# Patient Record
Sex: Female | Born: 1935 | Race: White | Hispanic: No | Marital: Married | Smoking: Never smoker
Health system: Southern US, Community
[De-identification: ages and names within clinical notes are randomized; demographics above are authoritative.]

## PROBLEM LIST (undated history)

## (undated) DIAGNOSIS — Z96649 Presence of unspecified artificial hip joint: Secondary | ICD-10-CM

## (undated) DIAGNOSIS — R41841 Cognitive communication deficit: Secondary | ICD-10-CM

## (undated) DIAGNOSIS — M978XXA Periprosthetic fracture around other internal prosthetic joint, initial encounter: Secondary | ICD-10-CM

## (undated) DIAGNOSIS — R296 Repeated falls: Secondary | ICD-10-CM

## (undated) DIAGNOSIS — W19XXXA Unspecified fall, initial encounter: Secondary | ICD-10-CM

## (undated) DIAGNOSIS — S72009A Fracture of unspecified part of neck of unspecified femur, initial encounter for closed fracture: Secondary | ICD-10-CM

## (undated) DIAGNOSIS — L8990 Pressure ulcer of unspecified site, unspecified stage: Secondary | ICD-10-CM

## (undated) DIAGNOSIS — R1310 Dysphagia, unspecified: Secondary | ICD-10-CM

## (undated) DIAGNOSIS — S329XXA Fracture of unspecified parts of lumbosacral spine and pelvis, initial encounter for closed fracture: Secondary | ICD-10-CM

## (undated) DIAGNOSIS — R053 Chronic cough: Secondary | ICD-10-CM

## (undated) DIAGNOSIS — R05 Cough: Secondary | ICD-10-CM

## (undated) DIAGNOSIS — A0472 Enterocolitis due to Clostridium difficile, not specified as recurrent: Secondary | ICD-10-CM

## (undated) DIAGNOSIS — R413 Other amnesia: Secondary | ICD-10-CM

## (undated) DIAGNOSIS — G20C Parkinsonism, unspecified: Secondary | ICD-10-CM

## (undated) DIAGNOSIS — R262 Difficulty in walking, not elsewhere classified: Secondary | ICD-10-CM

## (undated) DIAGNOSIS — F0390 Unspecified dementia without behavioral disturbance: Secondary | ICD-10-CM

## (undated) DIAGNOSIS — M85819 Other specified disorders of bone density and structure, unspecified shoulder: Secondary | ICD-10-CM

## (undated) DIAGNOSIS — G20 Parkinson's disease: Secondary | ICD-10-CM

## (undated) HISTORY — DX: Parkinson's disease: G20

## (undated) HISTORY — PX: JOINT REPLACEMENT: SHX530

## (undated) HISTORY — DX: Other amnesia: R41.3

## (undated) HISTORY — PX: TOTAL HIP ARTHROPLASTY: SHX124

## (undated) HISTORY — DX: Chronic cough: R05.3

## (undated) HISTORY — DX: Fracture of unspecified part of neck of unspecified femur, initial encounter for closed fracture: S72.009A

## (undated) HISTORY — DX: Parkinsonism, unspecified: G20.C

## (undated) HISTORY — PX: FRACTURE SURGERY: SHX138

## (undated) HISTORY — DX: Fracture of unspecified parts of lumbosacral spine and pelvis, initial encounter for closed fracture: S32.9XXA

## (undated) HISTORY — PX: BREAST LUMPECTOMY: SHX2

## (undated) HISTORY — PX: CATARACT EXTRACTION: SUR2

## (undated) HISTORY — DX: Cough: R05

## (undated) HISTORY — PX: ABDOMINAL HYSTERECTOMY: SHX81

---

## 2011-08-13 DIAGNOSIS — J210 Acute bronchiolitis due to respiratory syncytial virus: Secondary | ICD-10-CM | POA: Diagnosis not present

## 2011-09-27 DIAGNOSIS — H2510 Age-related nuclear cataract, unspecified eye: Secondary | ICD-10-CM | POA: Diagnosis not present

## 2011-10-01 DIAGNOSIS — R918 Other nonspecific abnormal finding of lung field: Secondary | ICD-10-CM | POA: Diagnosis not present

## 2011-10-01 DIAGNOSIS — R05 Cough: Secondary | ICD-10-CM | POA: Diagnosis not present

## 2011-10-08 DIAGNOSIS — R922 Inconclusive mammogram: Secondary | ICD-10-CM | POA: Diagnosis not present

## 2011-10-08 DIAGNOSIS — Z1231 Encounter for screening mammogram for malignant neoplasm of breast: Secondary | ICD-10-CM | POA: Diagnosis not present

## 2011-10-26 DIAGNOSIS — R928 Other abnormal and inconclusive findings on diagnostic imaging of breast: Secondary | ICD-10-CM | POA: Diagnosis not present

## 2011-10-28 DIAGNOSIS — R0602 Shortness of breath: Secondary | ICD-10-CM | POA: Diagnosis not present

## 2011-10-28 DIAGNOSIS — R0609 Other forms of dyspnea: Secondary | ICD-10-CM | POA: Diagnosis not present

## 2011-10-28 DIAGNOSIS — R0989 Other specified symptoms and signs involving the circulatory and respiratory systems: Secondary | ICD-10-CM | POA: Diagnosis not present

## 2012-02-21 DIAGNOSIS — R5381 Other malaise: Secondary | ICD-10-CM | POA: Diagnosis not present

## 2012-02-21 DIAGNOSIS — E7800 Pure hypercholesterolemia, unspecified: Secondary | ICD-10-CM | POA: Diagnosis not present

## 2012-02-21 DIAGNOSIS — Z1212 Encounter for screening for malignant neoplasm of rectum: Secondary | ICD-10-CM | POA: Diagnosis not present

## 2012-02-21 DIAGNOSIS — Z0000 Encounter for general adult medical examination without abnormal findings: Secondary | ICD-10-CM | POA: Diagnosis not present

## 2012-03-24 DIAGNOSIS — J449 Chronic obstructive pulmonary disease, unspecified: Secondary | ICD-10-CM | POA: Diagnosis not present

## 2012-03-24 DIAGNOSIS — R05 Cough: Secondary | ICD-10-CM | POA: Diagnosis not present

## 2012-03-24 DIAGNOSIS — R059 Cough, unspecified: Secondary | ICD-10-CM | POA: Diagnosis not present

## 2012-03-24 DIAGNOSIS — J310 Chronic rhinitis: Secondary | ICD-10-CM | POA: Diagnosis not present

## 2012-05-16 DIAGNOSIS — Z01419 Encounter for gynecological examination (general) (routine) without abnormal findings: Secondary | ICD-10-CM | POA: Diagnosis not present

## 2012-05-16 DIAGNOSIS — L940 Localized scleroderma [morphea]: Secondary | ICD-10-CM | POA: Diagnosis not present

## 2012-05-16 DIAGNOSIS — R87619 Unspecified abnormal cytological findings in specimens from cervix uteri: Secondary | ICD-10-CM | POA: Diagnosis not present

## 2012-09-28 DIAGNOSIS — H2510 Age-related nuclear cataract, unspecified eye: Secondary | ICD-10-CM | POA: Diagnosis not present

## 2012-10-02 DIAGNOSIS — M79609 Pain in unspecified limb: Secondary | ICD-10-CM | POA: Diagnosis not present

## 2012-11-08 DIAGNOSIS — Z1231 Encounter for screening mammogram for malignant neoplasm of breast: Secondary | ICD-10-CM | POA: Diagnosis not present

## 2013-01-29 DIAGNOSIS — H2510 Age-related nuclear cataract, unspecified eye: Secondary | ICD-10-CM | POA: Diagnosis not present

## 2013-01-29 DIAGNOSIS — H04209 Unspecified epiphora, unspecified lacrimal gland: Secondary | ICD-10-CM | POA: Diagnosis not present

## 2013-01-29 DIAGNOSIS — H524 Presbyopia: Secondary | ICD-10-CM | POA: Diagnosis not present

## 2013-01-29 DIAGNOSIS — H43399 Other vitreous opacities, unspecified eye: Secondary | ICD-10-CM | POA: Diagnosis not present

## 2013-01-29 DIAGNOSIS — H01009 Unspecified blepharitis unspecified eye, unspecified eyelid: Secondary | ICD-10-CM | POA: Diagnosis not present

## 2013-01-29 DIAGNOSIS — H16229 Keratoconjunctivitis sicca, not specified as Sjogren's, unspecified eye: Secondary | ICD-10-CM | POA: Diagnosis not present

## 2013-02-26 DIAGNOSIS — E7800 Pure hypercholesterolemia, unspecified: Secondary | ICD-10-CM | POA: Diagnosis not present

## 2013-02-26 DIAGNOSIS — R5381 Other malaise: Secondary | ICD-10-CM | POA: Diagnosis not present

## 2013-02-26 DIAGNOSIS — Z1212 Encounter for screening for malignant neoplasm of rectum: Secondary | ICD-10-CM | POA: Diagnosis not present

## 2013-02-26 DIAGNOSIS — R42 Dizziness and giddiness: Secondary | ICD-10-CM | POA: Diagnosis not present

## 2013-02-26 DIAGNOSIS — R0602 Shortness of breath: Secondary | ICD-10-CM | POA: Diagnosis not present

## 2013-03-02 DIAGNOSIS — R5381 Other malaise: Secondary | ICD-10-CM | POA: Diagnosis not present

## 2013-03-02 DIAGNOSIS — R42 Dizziness and giddiness: Secondary | ICD-10-CM | POA: Diagnosis not present

## 2013-03-02 DIAGNOSIS — R413 Other amnesia: Secondary | ICD-10-CM | POA: Diagnosis not present

## 2013-03-12 DIAGNOSIS — E2839 Other primary ovarian failure: Secondary | ICD-10-CM | POA: Diagnosis not present

## 2013-03-12 DIAGNOSIS — Z0000 Encounter for general adult medical examination without abnormal findings: Secondary | ICD-10-CM | POA: Diagnosis not present

## 2013-03-13 DIAGNOSIS — H2510 Age-related nuclear cataract, unspecified eye: Secondary | ICD-10-CM | POA: Diagnosis not present

## 2013-03-13 DIAGNOSIS — H269 Unspecified cataract: Secondary | ICD-10-CM | POA: Diagnosis not present

## 2013-04-12 DIAGNOSIS — H2510 Age-related nuclear cataract, unspecified eye: Secondary | ICD-10-CM | POA: Diagnosis not present

## 2013-05-08 DIAGNOSIS — H2510 Age-related nuclear cataract, unspecified eye: Secondary | ICD-10-CM | POA: Diagnosis not present

## 2013-05-08 DIAGNOSIS — H269 Unspecified cataract: Secondary | ICD-10-CM | POA: Diagnosis not present

## 2013-05-16 DIAGNOSIS — Z8742 Personal history of other diseases of the female genital tract: Secondary | ICD-10-CM | POA: Diagnosis not present

## 2013-05-16 DIAGNOSIS — Z1272 Encounter for screening for malignant neoplasm of vagina: Secondary | ICD-10-CM | POA: Diagnosis not present

## 2013-05-16 DIAGNOSIS — Z01419 Encounter for gynecological examination (general) (routine) without abnormal findings: Secondary | ICD-10-CM | POA: Diagnosis not present

## 2013-06-05 DIAGNOSIS — F411 Generalized anxiety disorder: Secondary | ICD-10-CM | POA: Diagnosis not present

## 2013-06-05 DIAGNOSIS — G4700 Insomnia, unspecified: Secondary | ICD-10-CM | POA: Diagnosis not present

## 2013-07-05 DIAGNOSIS — G4700 Insomnia, unspecified: Secondary | ICD-10-CM | POA: Diagnosis not present

## 2013-07-05 DIAGNOSIS — R42 Dizziness and giddiness: Secondary | ICD-10-CM | POA: Diagnosis not present

## 2013-09-19 ENCOUNTER — Emergency Department (HOSPITAL_COMMUNITY): Payer: Medicare Other

## 2013-09-19 ENCOUNTER — Encounter (HOSPITAL_COMMUNITY): Payer: Self-pay | Admitting: Emergency Medicine

## 2013-09-19 ENCOUNTER — Inpatient Hospital Stay (HOSPITAL_COMMUNITY)
Admission: EM | Admit: 2013-09-19 | Discharge: 2013-09-24 | DRG: 552 | Disposition: A | Discharge status: Skilled Nursing Facility | Payer: Medicare Other | Attending: Family Medicine | Admitting: Family Medicine

## 2013-09-19 DIAGNOSIS — J42 Unspecified chronic bronchitis: Secondary | ICD-10-CM | POA: Diagnosis not present

## 2013-09-19 DIAGNOSIS — R339 Retention of urine, unspecified: Secondary | ICD-10-CM

## 2013-09-19 DIAGNOSIS — S79919A Unspecified injury of unspecified hip, initial encounter: Secondary | ICD-10-CM | POA: Diagnosis not present

## 2013-09-19 DIAGNOSIS — W19XXXA Unspecified fall, initial encounter: Secondary | ICD-10-CM | POA: Diagnosis present

## 2013-09-19 DIAGNOSIS — M25559 Pain in unspecified hip: Secondary | ICD-10-CM | POA: Diagnosis not present

## 2013-09-19 DIAGNOSIS — D72829 Elevated white blood cell count, unspecified: Secondary | ICD-10-CM | POA: Diagnosis not present

## 2013-09-19 DIAGNOSIS — Z9181 History of falling: Secondary | ICD-10-CM

## 2013-09-19 DIAGNOSIS — S329XXA Fracture of unspecified parts of lumbosacral spine and pelvis, initial encounter for closed fracture: Secondary | ICD-10-CM | POA: Diagnosis present

## 2013-09-19 DIAGNOSIS — S32509A Unspecified fracture of unspecified pubis, initial encounter for closed fracture: Secondary | ICD-10-CM | POA: Diagnosis not present

## 2013-09-19 DIAGNOSIS — Z833 Family history of diabetes mellitus: Secondary | ICD-10-CM

## 2013-09-19 DIAGNOSIS — Z96649 Presence of unspecified artificial hip joint: Secondary | ICD-10-CM

## 2013-09-19 DIAGNOSIS — S322XXA Fracture of coccyx, initial encounter for closed fracture: Secondary | ICD-10-CM | POA: Diagnosis not present

## 2013-09-19 DIAGNOSIS — R627 Adult failure to thrive: Secondary | ICD-10-CM | POA: Diagnosis present

## 2013-09-19 DIAGNOSIS — S3210XA Unspecified fracture of sacrum, initial encounter for closed fracture: Secondary | ICD-10-CM | POA: Diagnosis not present

## 2013-09-19 DIAGNOSIS — S79929A Unspecified injury of unspecified thigh, initial encounter: Secondary | ICD-10-CM | POA: Diagnosis not present

## 2013-09-19 DIAGNOSIS — N390 Urinary tract infection, site not specified: Secondary | ICD-10-CM

## 2013-09-19 DIAGNOSIS — M1990 Unspecified osteoarthritis, unspecified site: Secondary | ICD-10-CM | POA: Diagnosis present

## 2013-09-19 DIAGNOSIS — K5900 Constipation, unspecified: Secondary | ICD-10-CM

## 2013-09-19 DIAGNOSIS — I10 Essential (primary) hypertension: Secondary | ICD-10-CM

## 2013-09-19 DIAGNOSIS — T4275XA Adverse effect of unspecified antiepileptic and sedative-hypnotic drugs, initial encounter: Secondary | ICD-10-CM | POA: Diagnosis not present

## 2013-09-19 DIAGNOSIS — Y9229 Other specified public building as the place of occurrence of the external cause: Secondary | ICD-10-CM

## 2013-09-19 LAB — CBC WITH DIFFERENTIAL/PLATELET
BASOS ABS: 0 10*3/uL (ref 0.0–0.1)
Basophils Relative: 0 % (ref 0–1)
Eosinophils Absolute: 0 10*3/uL (ref 0.0–0.7)
Eosinophils Relative: 0 % (ref 0–5)
HEMATOCRIT: 39.9 % (ref 36.0–46.0)
Hemoglobin: 13.3 g/dL (ref 12.0–15.0)
LYMPHS PCT: 6 % — AB (ref 12–46)
Lymphs Abs: 1.3 10*3/uL (ref 0.7–4.0)
MCH: 31.7 pg (ref 26.0–34.0)
MCHC: 33.3 g/dL (ref 30.0–36.0)
MCV: 95 fL (ref 78.0–100.0)
Monocytes Absolute: 1.5 10*3/uL — ABNORMAL HIGH (ref 0.1–1.0)
Monocytes Relative: 7 % (ref 3–12)
NEUTROS ABS: 18.7 10*3/uL — AB (ref 1.7–7.7)
Neutrophils Relative %: 87 % — ABNORMAL HIGH (ref 43–77)
PLATELETS: 324 10*3/uL (ref 150–400)
RBC: 4.2 MIL/uL (ref 3.87–5.11)
RDW: 13.1 % (ref 11.5–15.5)
WBC: 21.4 10*3/uL — AB (ref 4.0–10.5)

## 2013-09-19 LAB — BASIC METABOLIC PANEL
BUN: 13 mg/dL (ref 6–23)
CHLORIDE: 104 meq/L (ref 96–112)
CO2: 26 meq/L (ref 19–32)
Calcium: 9.7 mg/dL (ref 8.4–10.5)
Creatinine, Ser: 0.81 mg/dL (ref 0.50–1.10)
GFR calc Af Amer: 78 mL/min — ABNORMAL LOW (ref >90)
GFR calc non Af Amer: 67 mL/min — ABNORMAL LOW (ref >90)
Glucose, Bld: 137 mg/dL — ABNORMAL HIGH (ref 70–99)
Potassium: 3.9 mEq/L (ref 3.7–5.3)
SODIUM: 143 meq/L (ref 137–147)

## 2013-09-19 LAB — URINALYSIS, ROUTINE W REFLEX MICROSCOPIC
Bilirubin Urine: NEGATIVE
GLUCOSE, UA: NEGATIVE mg/dL
HGB URINE DIPSTICK: NEGATIVE
Ketones, ur: NEGATIVE mg/dL
LEUKOCYTES UA: NEGATIVE
Nitrite: NEGATIVE
PH: 5.5 (ref 5.0–8.0)
Protein, ur: NEGATIVE mg/dL
SPECIFIC GRAVITY, URINE: 1.01 (ref 1.005–1.030)
Urobilinogen, UA: 0.2 mg/dL (ref 0.0–1.0)

## 2013-09-19 MED ORDER — OXYCODONE-ACETAMINOPHEN 5-325 MG PO TABS
1.0000 | ORAL_TABLET | Freq: Once | ORAL | Status: AC
  Administered 2013-09-19: 1 via ORAL
  Filled 2013-09-19: qty 1

## 2013-09-19 MED ORDER — ONDANSETRON HCL 4 MG/2ML IJ SOLN
4.0000 mg | Freq: Four times a day (QID) | INTRAMUSCULAR | Status: DC | PRN
  Administered 2013-09-20: 4 mg via INTRAVENOUS
  Filled 2013-09-19: qty 2

## 2013-09-19 MED ORDER — HYDROCODONE-ACETAMINOPHEN 5-325 MG PO TABS
1.0000 | ORAL_TABLET | ORAL | Status: DC | PRN
  Filled 2013-09-19: qty 2

## 2013-09-19 MED ORDER — OXYCODONE-ACETAMINOPHEN 5-325 MG PO TABS
1.0000 | ORAL_TABLET | ORAL | Status: DC | PRN
  Administered 2013-09-19 – 2013-09-20 (×2): 1 via ORAL
  Filled 2013-09-19 (×2): qty 1

## 2013-09-19 MED ORDER — GUAIFENESIN-DM 100-10 MG/5ML PO SYRP: 5.0000 mL | ORAL_SOLUTION | ORAL | Status: DC | PRN

## 2013-09-19 MED ORDER — ONDANSETRON HCL 4 MG PO TABS: 4.0000 mg | ORAL_TABLET | Freq: Four times a day (QID) | ORAL | Status: DC | PRN

## 2013-09-19 MED ORDER — POLYETHYLENE GLYCOL 3350 17 G PO PACK: 17.0000 g | PACK | Freq: Every day | ORAL | Status: DC | PRN

## 2013-09-19 MED ORDER — MORPHINE SULFATE 2 MG/ML IJ SOLN
2.0000 mg | INTRAMUSCULAR | Status: DC | PRN
  Administered 2013-09-19: 2 mg via INTRAVENOUS
  Filled 2013-09-19: qty 1

## 2013-09-19 MED ORDER — TRAZODONE HCL 50 MG PO TABS
100.0000 mg | ORAL_TABLET | Freq: Every day | ORAL | Status: DC
  Administered 2013-09-20 – 2013-09-23 (×2): 100 mg via ORAL
  Filled 2013-09-19 (×4): qty 2

## 2013-09-19 MED ORDER — MORPHINE SULFATE 4 MG/ML IJ SOLN
4.0000 mg | INTRAMUSCULAR | Status: DC | PRN
  Administered 2013-09-20: 4 mg via INTRAVENOUS
  Filled 2013-09-19 (×2): qty 1

## 2013-09-19 NOTE — ED Notes (Signed)
Attempted to ambulate patient.  Patient walked from stretcher to chair which consisted of 3 steps and stated it was too painful.  Patient was placed in chair at her request.

## 2013-09-19 NOTE — H&P (Signed)
Patient Demographics  [name] Oliver, is a 80 y.o. female  MRN: [id number]   DOB - 04/28/1933  Admit Date - 09/19/2013  [facility]   With History of -  Past medical history of lack of sleep  Past Surgical History  Procedure Laterality Date  . Fracture surgery    . Abdominal hysterectomy    . Joint replacement      in for   Chief Complaint  Patient presents with  . Hip Pain     HPI  [name] Oliver  is a 80 y.o. female, who is in very good health only known medical problem is lack of sleep for which he takes trazodone, status post right hip [facility] surgery [facility] ago [facility], hysterectomy, who was at church today, at church her husband was trying to reach up in the closet by standing on a folding chair, the folding chair gave out and he fell, in efforts of catching him patient herself fell on top of him and hurt her right hip, she then had right-sided hip pain, this is the side where she has her hip prosthesis, she then came to the ER where a CT scan of the hip revealed no dislocation a fracture of the prosthesis but right inferior and superior pelvic fracture along with some sacral injury and mild hematoma. Patient besides pain in the right hip has no discomfort or complaints, she was trying to ambulate but with limited success, I was called to admit the patient for right-sided pelvic fracture. She was also noted to have a reactionary leukocytosis upon admission.   She denies any headache, no chest pain palpitations, she has a chronic cough for the last 25 years but no change in that pattern, no abdominal pain or diarrhea, no dysuria, no focal weakness whatsoever.    Review of Systems    In addition to the HPI above,   No Fever-chills, No Headache, No changes  with Vision or hearing, No problems swallowing food or Liquids, No Chest pain, Cough or Shortness of Breath, No Abdominal pain, No Nausea or Vommitting, Bowel movements are regular, No Blood in stool or Urine, No dysuria, No new skin rashes or bruises, No new joints pains-aches,  No new weakness, tingling, numbness in any extremity, except right hip pain No recent weight gain or loss, No polyuria, polydypsia or polyphagia, No significant Mental Stressors.  A full 10 point Review of Systems was done, except as stated above, all other Review of Systems were negative.   Social History History  Substance Use Topics  . Smoking status: Never Smoker   . Smokeless tobacco: Never Used  . Alcohol Use: No      Family History Family History  Problem Relation Age of Onset  . Diabetes Other       Prior to Admission medications   Medication Sig Start Date End Date Taking? Authorizing Provider  traZODone (DESYREL) 100  MG tablet Take 100 mg by mouth at bedtime. 08/20/13  Yes Historical Provider, MD    No Known Allergies  Physical Exam  Vitals  Blood pressure 156/73, pulse 88, temperature 97.9 F (36.6 C), resp. rate 20, height 5\' 5"  (1.651 m), weight 63.504 kg (140 lb), SpO2 98.00%.   1. General thin elderly white female lying in bed in mild pain  2. Normal affect and insight, Not Suicidal or Homicidal, Awake Alert, Oriented X 3.  3. No F.N deficits, ALL C.Nerves Intact, Strength 5/5 all 4 extremities, Sensation intact all 4 extremities, Plantars down going.  4. Ears and Eyes appear Normal, Conjunctivae clear, PERRLA. Moist Oral Mucosa.  5. Supple Neck, No JVD, No cervical lymphadenopathy appriciated, No Carotid Bruits.  6. Symmetrical Chest wall movement, Good air movement bilaterally, CTAB.  7. RRR, No Gallops, Rubs or Murmurs, No Parasternal Heave.  8. Positive Bowel Sounds, Abdomen Soft, Non tender, No organomegaly appriciated,No rebound -guarding or rigidity.  9.  No  Cyanosis, Normal Skin Turgor, No Skin Rash or Bruise.  10. Good muscle tone,  joints appear normal , no effusions, Normal ROM. Tenderness on the right pelvic area on palpation.  11. No Palpable Lymph Nodes in Neck or Axillae     Data Review  CBC  Recent Labs Lab 09/19/13 1725  WBC 21.4*  HGB 13.3  HCT 39.9  PLT 324  MCV 95.0  MCH 31.7  MCHC 33.3  RDW 13.1  LYMPHSABS 1.3  MONOABS 1.5*  EOSABS 0.0  BASOSABS 0.0   ------------------------------------------------------------------------------------------------------------------  Chemistries   Recent Labs Lab 09/19/13 1725  NA 143  K 3.9  CL 104  CO2 26  GLUCOSE 137*  BUN 13  CREATININE 0.81  CALCIUM 9.7   ------------------------------------------------------------------------------------------------------------------ estimated creatinine clearance is 50.7 ml/min (by C-G formula based on Cr of 0.81). ------------------------------------------------------------------------------------------------------------------ No results found for this basename: TSH, T4TOTAL, FREET3, T3FREE, THYROIDAB,  in the last 72 hours   Coagulation profile No results found for this basename: INR, PROTIME,  in the last 168 hours ------------------------------------------------------------------------------------------------------------------- No results found for this basename: DDIMER,  in the last 72 hours -------------------------------------------------------------------------------------------------------------------  Cardiac Enzymes No results found for this basename: CK, CKMB, TROPONINI, MYOGLOBIN,  in the last 168 hours ------------------------------------------------------------------------------------------------------------------ No components found with this basename: POCBNP,    ---------------------------------------------------------------------------------------------------------------  Urinalysis No results found for  this basename: colorurine, appearanceur, labspec, phurine, glucoseu, hgbur, bilirubinur, ketonesur, proteinur, urobilinogen, nitrite, leukocytesur    ----------------------------------------------------------------------------------------------------------------  Imaging results:   Dg Hip Complete Right  09/19/2013   CLINICAL DATA:  Right hip pain status post trauma  EXAM: RIGHT HIP - COMPLETE 2+ VIEW  COMPARISON:  None.  FINDINGS: Total right hip arthroplasty is appreciated. Hardware intact without evidence of loosening or failure. The native osseous structures demonstrate no evidence of acute fracture or dislocation. Degenerative changes appreciated within the lower lumbar spine.  IMPRESSION: No evidence of hardware no osseous abnormalities.   Electronically Signed   By: Salome HolmesHector  Cooper M.D.   On: 09/19/2013 15:39   Ct Hip Right Wo Contrast  09/19/2013   CLINICAL DATA:  Hip pain with limited angulation after falling today. History of right total hip arthroplasty.  EXAM: CT OF THE RIGHT HIP WITHOUT CONTRAST  TECHNIQUE: Multidetector CT imaging was performed according to the standard protocol. Multiplanar CT image reconstructions were also generated.  COMPARISON:  Pelvic and right hip radiographs same date.  FINDINGS: Examination is limited to the right hip and proximal femur. The entire pelvis is not  imaged. There is moderate artifact related to the right total hip arthroplasty.  There are mildly displaced acute fractures of the right inferior pubic ramus. The superior pubic ramus is largely obscured by artifact, but likely has a nondisplaced fracture. The inferior aspect of the right sacroiliac joint is visualized and is intact. However, there is a nondisplaced fracture of the right sacrum, best seen on the coronal images.  The right hip prosthesis appears intact without evidence of dislocation. Thick heterotopic ossification is noted lateral to acetabulum. This does not appear acute as correlated with  the radiographs. No hardware loosening is apparent. There is no evidence of proximal femur fracture. Of note, the distal end of the prosthesis is not imaged on this CT. It was imaged on the earlier radiographs.  There is a small amount of soft tissue hemorrhage surrounding the pubic rami fractures and tracking superiorly along the right iliac vessels. No large hematoma is demonstrated.  IMPRESSION: 1. Mildly displaced fractures of the right inferior pubic rami. Although not well seen, there is a probable coexisting superior pubic ramus fracture. 2. Nondisplaced fracture of the right sacrum. The visualized right sacroiliac joint appears intact. 3. No evidence of dislocation of the right hip prosthesis. No periprosthetic fracture identified- the distal end of the prosthesis is not imaged but appeared intact on earlier radiographs. 4. Small amount of acute hemorrhage adjacent to the fractures.   Electronically Signed   By: Roxy Horseman M.D.   On: 09/19/2013 17:00      Will order baseline EKG-CXR-UA    Assessment & Plan   1. Mechanical fall causing right-sided pelvic and sacral fracture. Prostatic hip joint appears to be intact. We'll touch base with orthopedic surgery to make sure no surgical intervention is needed the ER physician is calling the orthopedic surgeon, for now she will be admitted to a MedSurg bed for 23 hours, PT evaluation in the morning, she would like to be discharged home if possible with physical therapy and walker. For now pain control and supportive care.   2. Leukocytosis most likely reactionary however will check a baseline UA and chest x-ray. Monitor temperature curve.    3. Lack of sleep. Continue home dose trazodone    DVT Prophylaxis  SCDs    AM Labs Ordered, also please review Full Orders  Family Communication: Admission, patients condition and plan of care including tests being ordered have been discussed with the patient and husband who indicate understanding  and agree with the plan and Code Status.  Code Status full  Likely DC to home  Condition fair  Time spent in minutes :35    Leroy Sea M.D on 09/19/2013 at 6:28 PM  Between 7am to 7pm - Pager - [phone]  After 7pm go to [email or web address] - password [email or web address]  And look for the night coverage person covering me after hours  [facility] Office  [phone]

## 2013-09-19 NOTE — ED Notes (Signed)
Pt fell from a chair on to R hip. Pt reports pain with mvmt. No shortening or rotation of R leg.

## 2013-09-19 NOTE — ED Notes (Signed)
Patient placed on bedpan. Vitals taken at this time. Patient repositioned in bed with pillow underneath knees.

## 2013-09-19 NOTE — ED Provider Notes (Signed)
CSN: [id number]     Arrival date & time 09/19/13  1407 History  This chart was scribed for [name] GivensIva L Johnasia Liese, MD by Leone PayorSonum Patel, ED Scribe. This patient was seen in room APA11/APA11 and the patient's care was started 2:54 PM.    Chief Complaint  Patient presents with  . Hip Pain      The history is provided by the patient. No language interpreter was used.    HPI Comments: [name] Oliver is a 80 y.o. female who presents to the Emergency Department complaining of a fall that occurred about 1:15 pm. She reports helping her husband as he stood on top of a metal folding chair when he fell then she fell on top of him.  She denies head injury or LOC. She reports having constant right hip pain which is gradually improving. She reports a right hip [facility] surgery [facility] ago at [facility]. She denies numbness. The only medication she takes is trazodone at bedtime.   PC[facility]ah [facility]  History reviewed. No pertinent past medical history. Past Surgical History  Procedure Laterality Date  . Fracture surgery    . Abdominal hysterectomy    . Joint replacement     Family History  Problem Relation Age of Onset  . Diabetes Other    History  Substance Use Topics  . Smoking status: Never Smoker   . Smokeless tobacco: Never Used  . Alcohol Use: No   Lives at home Lives with spouse  OB History   Grav Para Term Preterm Abortions TAB SAB Ect Mult Living   3 2 2  1  1         Review of Systems  Musculoskeletal: Positive for arthralgias (right hip pain).  Neurological: Negative for syncope, numbness and headaches.  All other systems reviewed and are negative.     Allergies  Review of patient's allergies indicates no known allergies.  Home Medications   Prior to Admission medications   Medication Sig Start Date End Date Taking? Authorizing Provider  traZODone (DESYREL) 100 MG tablet Take 100 mg by mouth at bedtime. 08/20/13  Yes Historical Provider, MD   BP 156/73   Pulse 88  Temp(Src) 97.9 F (36.6 C)  Resp 20  Ht 5\' 5"  (1.651 m)  Wt 140 lb (63.504 kg)  BMI 23.30 kg/m2  SpO2 98%  Vital signs normal   Physical Exam  Nursing note and vitals reviewed. Constitutional: She is oriented to person, place, and time. She appears well-developed and well-nourished.  Non-toxic appearance. She does not appear ill. No distress.  HENT:  Head: Normocephalic and atraumatic.  Right Ear: External ear normal.  Left Ear: External ear normal.  Nose: Nose normal. No mucosal edema or rhinorrhea.  Mouth/Throat: Oropharynx is clear and moist and mucous membranes are normal. No dental abscesses or uvula swelling.  Head is non tender   Eyes: Conjunctivae and EOM are normal. Pupils are equal, round, and reactive to light.  Neck: Normal range of motion and full passive range of motion without pain. Neck supple.  Neck is non tender.   Cardiovascular: Normal rate, regular rhythm and normal heart sounds.  Exam reveals no gallop and no friction rub.   No murmur heard. Pulmonary/Chest: Effort normal and breath sounds normal. No respiratory distress. She has no wheezes. She has no rhonchi. She has no rales. She exhibits no tenderness and no crepitus.  Abdominal: Soft. Normal appearance and bowel sounds are normal. She exhibits no distension. There  is no tenderness. There is no rebound and no guarding.  Musculoskeletal: Normal range of motion. She exhibits tenderness. She exhibits no edema.       Legs: Moves all extremities well. Tenderness to palpation over the great trochanter on right hip. Good ROM. No shortening of the RLE. No external or internal rotation of the RLE.   Neurological: She is alert and oriented to person, place, and time. She has normal strength. No cranial nerve deficit.  Skin: Skin is warm, dry and intact. No rash noted. No erythema. No pallor.  Psychiatric: She has a normal mood and affect. Her speech is normal and behavior is normal. Her mood appears not  anxious.    ED Course  Procedures (including critical care time) Medications  oxyCODONE-acetaminophen (PERCOCET/ROXICET) 5-325 MG per tablet 1 tablet (1 tablet Oral Given 09/19/13 1805)     DIAGNOSTIC STUDIES: Oxygen Saturation is 98% on RA, normal by my interpretation.    COORDINATION OF CARE: 3:02 PM Will XRAY right hip. Discussed treatment plan with pt at bedside and pt agreed to plan. Pt refused Pain medication.  16:05 We discussed her xray results. Pt has attempted to walk and she is unable b/o pain. Pt states she will never have another MRI, is agreeable to CT scan. Refused pain medication again.   18:00 Discussed results of her CT scan with patient. Gave option of going home or being admitted for pain control and to get outpatient physical therapy. Pt opted for admission. She is now agreeable to taking pain medication.   18:20 Dr Thedore Mins admit to med-surg, wants orthopedics to review her scan.   19:40 Dr Hilda Lias will look at her scans    Labs Review Results for orders placed during the hospital encounter of 09/19/13  CBC WITH DIFFERENTIAL      Result Value Ref Range   WBC 21.4 (*) 4.0 - 10.5 K/uL   RBC 4.20  3.87 - 5.11 MIL/uL   Hemoglobin 13.3  12.0 - 15.0 g/dL   HCT 16.1  09.6 - 04.5 %   MCV 95.0  78.0 - 100.0 fL   MCH 31.7  26.0 - 34.0 pg   MCHC 33.3  30.0 - 36.0 g/dL   RDW 40.9  81.1 - 91.4 %   Platelets 324  150 - 400 K/uL   Neutrophils Relative % 87 (*) 43 - 77 %   Neutro Abs 18.7 (*) 1.7 - 7.7 K/uL   Lymphocytes Relative 6 (*) 12 - 46 %   Lymphs Abs 1.3  0.7 - 4.0 K/uL   Monocytes Relative 7  3 - 12 %   Monocytes Absolute 1.5 (*) 0.1 - 1.0 K/uL   Eosinophils Relative 0  0 - 5 %   Eosinophils Absolute 0.0  0.0 - 0.7 K/uL   Basophils Relative 0  0 - 1 %   Basophils Absolute 0.0  0.0 - 0.1 K/uL  BASIC METABOLIC PANEL      Result Value Ref Range   Sodium 143  137 - 147 mEq/L   Potassium 3.9  3.7 - 5.3 mEq/L   Chloride 104  96 - 112 mEq/L   CO2 26  19 - 32  mEq/L   Glucose, Bld 137 (*) 70 - 99 mg/dL   BUN 13  6 - 23 mg/dL   Creatinine, Ser 7.82  0.50 - 1.10 mg/dL   Calcium 9.7  8.4 - 95.6 mg/dL   GFR calc non Af Amer 67 (*) >90 mL/min   GFR  calc Af Amer 78 (*) >90 mL/min   Laboratory interpretation all normal except leukocytosis     Imaging Review Dg Hip Complete Right  09/19/2013   CLINICAL DATA:  Right hip pain status post trauma  EXAM: RIGHT HIP - COMPLETE 2+ VIEW  COMPARISON:  None.  FINDINGS: Total right hip arthroplasty is appreciated. Hardware intact without evidence of loosening or failure. The native osseous structures demonstrate no evidence of acute fracture or dislocation. Degenerative changes appreciated within the lower lumbar spine.  IMPRESSION: No evidence of hardware no osseous abnormalities.   Electronically Signed   By: Salome HolmesHector  Cooper M.D.   On: 09/19/2013 15:39   Ct Hip Right Wo Contrast  09/19/2013   CLINICAL DATA:  Hip pain with limited angulation after falling today. History of right total hip arthroplasty.  EXAM: CT OF THE RIGHT HIP WITHOUT CONTRAST  TECHNIQUE: Multidetector CT imaging was performed according to the standard protocol. Multiplanar CT image reconstructions were also generated.  COMPARISON:  Pelvic and right hip radiographs same date.  FINDINGS: Examination is limited to the right hip and proximal femur. The entire pelvis is not imaged. There is moderate artifact related to the right total hip arthroplasty.  There are mildly displaced acute fractures of the right inferior pubic ramus. The superior pubic ramus is largely obscured by artifact, but likely has a nondisplaced fracture. The inferior aspect of the right sacroiliac joint is visualized and is intact. However, there is a nondisplaced fracture of the right sacrum, best seen on the coronal images.  The right hip prosthesis appears intact without evidence of dislocation. Thick heterotopic ossification is noted lateral to acetabulum. This does not appear acute  as correlated with the radiographs. No hardware loosening is apparent. There is no evidence of proximal femur fracture. Of note, the distal end of the prosthesis is not imaged on this CT. It was imaged on the earlier radiographs.  There is a small amount of soft tissue hemorrhage surrounding the pubic rami fractures and tracking superiorly along the right iliac vessels. No large hematoma is demonstrated.  IMPRESSION: 1. Mildly displaced fractures of the right inferior pubic rami. Although not well seen, there is a probable coexisting superior pubic ramus fracture. 2. Nondisplaced fracture of the right sacrum. The visualized right sacroiliac joint appears intact. 3. No evidence of dislocation of the right hip prosthesis. No periprosthetic fracture identified- the distal end of the prosthesis is not imaged but appeared intact on earlier radiographs. 4. Small amount of acute hemorrhage adjacent to the fractures.   Electronically Signed   By: Roxy HorsemanBill  Veazey M.D.   On: 09/19/2013 17:00   Dg Chest Port 1 View  09/19/2013   CLINICAL DATA:  Cough.  EXAM: PORTABLE CHEST - 1 VIEW  COMPARISON:  03/24/2012.  FINDINGS: Cardiac silhouette, mediastinal and hilar contours are within normal limits and stable. There is mild tortuosity and ectasia of the thoracic aorta. The lungs are clear of acute process. Mild chronic bronchitic changes. No focal infiltrates or pleural effusion. The bony thorax is intact.  IMPRESSION: No acute cardiopulmonary findings.  Chronic bronchitic changes.   Electronically Signed   By: Loralie ChampagneMark  Gallerani M.D.   On: 09/19/2013 18:44     EKG Interpretation None      MDM   Final diagnoses:  Pelvic fracture  Fall  Leukocytosis   Plan admission  Devoria AlbeIva Nickson [place], MD, FACEP   I personally performed the services described in this documentation, which was scribed in my presence. The recorded information  has been reviewed and considered.  Devoria Albe, MD, Armando Gang   [name] Givens, MD 09/19/13 2128

## 2013-09-20 DIAGNOSIS — I10 Essential (primary) hypertension: Secondary | ICD-10-CM | POA: Diagnosis not present

## 2013-09-20 DIAGNOSIS — S329XXA Fracture of unspecified parts of lumbosacral spine and pelvis, initial encounter for closed fracture: Secondary | ICD-10-CM | POA: Diagnosis not present

## 2013-09-20 DIAGNOSIS — W19XXXA Unspecified fall, initial encounter: Secondary | ICD-10-CM | POA: Diagnosis not present

## 2013-09-20 DIAGNOSIS — K5900 Constipation, unspecified: Secondary | ICD-10-CM | POA: Diagnosis not present

## 2013-09-20 DIAGNOSIS — M25559 Pain in unspecified hip: Secondary | ICD-10-CM | POA: Diagnosis not present

## 2013-09-20 DIAGNOSIS — M1990 Unspecified osteoarthritis, unspecified site: Secondary | ICD-10-CM | POA: Diagnosis present

## 2013-09-20 DIAGNOSIS — Z96649 Presence of unspecified artificial hip joint: Secondary | ICD-10-CM | POA: Diagnosis not present

## 2013-09-20 DIAGNOSIS — R339 Retention of urine, unspecified: Secondary | ICD-10-CM | POA: Diagnosis not present

## 2013-09-20 DIAGNOSIS — M79609 Pain in unspecified limb: Secondary | ICD-10-CM | POA: Diagnosis not present

## 2013-09-20 DIAGNOSIS — R41841 Cognitive communication deficit: Secondary | ICD-10-CM | POA: Diagnosis not present

## 2013-09-20 DIAGNOSIS — R262 Difficulty in walking, not elsewhere classified: Secondary | ICD-10-CM | POA: Diagnosis not present

## 2013-09-20 DIAGNOSIS — Z9181 History of falling: Secondary | ICD-10-CM | POA: Diagnosis not present

## 2013-09-20 DIAGNOSIS — R279 Unspecified lack of coordination: Secondary | ICD-10-CM | POA: Diagnosis not present

## 2013-09-20 DIAGNOSIS — R269 Unspecified abnormalities of gait and mobility: Secondary | ICD-10-CM | POA: Diagnosis not present

## 2013-09-20 DIAGNOSIS — R4182 Altered mental status, unspecified: Secondary | ICD-10-CM | POA: Diagnosis not present

## 2013-09-20 DIAGNOSIS — S3210XA Unspecified fracture of sacrum, initial encounter for closed fracture: Secondary | ICD-10-CM | POA: Diagnosis present

## 2013-09-20 DIAGNOSIS — D72829 Elevated white blood cell count, unspecified: Secondary | ICD-10-CM | POA: Diagnosis not present

## 2013-09-20 DIAGNOSIS — R627 Adult failure to thrive: Secondary | ICD-10-CM | POA: Diagnosis present

## 2013-09-20 DIAGNOSIS — M6281 Muscle weakness (generalized): Secondary | ICD-10-CM | POA: Diagnosis not present

## 2013-09-20 DIAGNOSIS — S79919A Unspecified injury of unspecified hip, initial encounter: Secondary | ICD-10-CM | POA: Diagnosis not present

## 2013-09-20 DIAGNOSIS — G3184 Mild cognitive impairment, so stated: Secondary | ICD-10-CM | POA: Diagnosis not present

## 2013-09-20 DIAGNOSIS — S32509A Unspecified fracture of unspecified pubis, initial encounter for closed fracture: Secondary | ICD-10-CM | POA: Diagnosis present

## 2013-09-20 DIAGNOSIS — R569 Unspecified convulsions: Secondary | ICD-10-CM | POA: Diagnosis not present

## 2013-09-20 DIAGNOSIS — IMO0001 Reserved for inherently not codable concepts without codable children: Secondary | ICD-10-CM | POA: Diagnosis not present

## 2013-09-20 DIAGNOSIS — N390 Urinary tract infection, site not specified: Secondary | ICD-10-CM | POA: Diagnosis not present

## 2013-09-20 DIAGNOSIS — Z833 Family history of diabetes mellitus: Secondary | ICD-10-CM | POA: Diagnosis not present

## 2013-09-20 LAB — CBC
HCT: 37.3 % (ref 36.0–46.0)
Hemoglobin: 12.4 g/dL (ref 12.0–15.0)
MCH: 31.6 pg (ref 26.0–34.0)
MCHC: 33.2 g/dL (ref 30.0–36.0)
MCV: 94.9 fL (ref 78.0–100.0)
PLATELETS: 277 10*3/uL (ref 150–400)
RBC: 3.93 MIL/uL (ref 3.87–5.11)
RDW: 12.8 % (ref 11.5–15.5)
WBC: 14.3 10*3/uL — AB (ref 4.0–10.5)

## 2013-09-20 LAB — BASIC METABOLIC PANEL
BUN: 12 mg/dL (ref 6–23)
CO2: 27 mEq/L (ref 19–32)
Calcium: 9.1 mg/dL (ref 8.4–10.5)
Chloride: 105 mEq/L (ref 96–112)
Creatinine, Ser: 0.74 mg/dL (ref 0.50–1.10)
GFR calc Af Amer: >90 mL/min (ref >90)
GFR, EST NON AFRICAN AMERICAN: 79 mL/min — AB (ref >90)
Glucose, Bld: 140 mg/dL — ABNORMAL HIGH (ref 70–99)
Potassium: 4 mEq/L (ref 3.7–5.3)
SODIUM: 143 meq/L (ref 137–147)

## 2013-09-20 MED ORDER — DIPHENHYDRAMINE HCL 12.5 MG/5ML PO ELIX: 12.5000 mg | ORAL_SOLUTION | Freq: Four times a day (QID) | ORAL | Status: DC | PRN

## 2013-09-20 MED ORDER — LABETALOL HCL 5 MG/ML IV SOLN
20.0000 mg | Freq: Once | INTRAVENOUS | Status: AC
  Administered 2013-09-20: 20 mg via INTRAVENOUS
  Filled 2013-09-20: qty 4

## 2013-09-20 MED ORDER — NALOXONE HCL 0.4 MG/ML IJ SOLN
0.4000 mg | INTRAMUSCULAR | Status: DC | PRN
Start: 2013-09-20 — End: 2013-09-23

## 2013-09-20 MED ORDER — SODIUM CHLORIDE 0.9 % IJ SOLN: 9.0000 mL | INTRAMUSCULAR | Status: DC | PRN

## 2013-09-20 MED ORDER — METOPROLOL TARTRATE 25 MG PO TABS
25.0000 mg | ORAL_TABLET | Freq: Two times a day (BID) | ORAL | Status: DC
  Administered 2013-09-20: 25 mg via ORAL
  Filled 2013-09-20: qty 1

## 2013-09-20 MED ORDER — DIPHENHYDRAMINE HCL 50 MG/ML IJ SOLN: 12.5000 mg | Freq: Four times a day (QID) | INTRAMUSCULAR | Status: DC | PRN

## 2013-09-20 MED ORDER — ONDANSETRON HCL 4 MG/2ML IJ SOLN: 4.0000 mg | Freq: Four times a day (QID) | INTRAMUSCULAR | Status: DC | PRN

## 2013-09-20 MED ORDER — LABETALOL HCL 5 MG/ML IV SOLN: 20.0000 mg | Freq: Once | INTRAVENOUS | Status: DC

## 2013-09-20 MED ORDER — LABETALOL HCL 5 MG/ML IV SOLN: 10.0000 mg | INTRAVENOUS | Status: DC | PRN

## 2013-09-20 MED ORDER — HYDRALAZINE HCL 20 MG/ML IJ SOLN
10.0000 mg | Freq: Once | INTRAMUSCULAR | Status: DC
  Filled 2013-09-20: qty 1

## 2013-09-20 MED ORDER — MORPHINE SULFATE (PF) 1 MG/ML IV SOLN
INTRAVENOUS | Status: DC
  Administered 2013-09-20: 09:00:00 via INTRAVENOUS
  Administered 2013-09-20: 7.5 mg via INTRAVENOUS
  Administered 2013-09-21: 16:00:00 via INTRAVENOUS
  Administered 2013-09-21: 6.97 mg via INTRAVENOUS
  Administered 2013-09-21: 1.5 mg via INTRAVENOUS
  Administered 2013-09-22: 3 mg via INTRAVENOUS
  Administered 2013-09-22: 5 mg via INTRAVENOUS
  Administered 2013-09-22: 9 mg via INTRAVENOUS
  Administered 2013-09-22: 1.5 mg via INTRAVENOUS
  Administered 2013-09-22: 3 mg via INTRAVENOUS
  Administered 2013-09-22: 11:00:00 via INTRAVENOUS
  Administered 2013-09-22: 11.5 mg via INTRAVENOUS
  Administered 2013-09-23: 02:00:00 via INTRAVENOUS
  Filled 2013-09-20 (×4): qty 25

## 2013-09-20 NOTE — Consult Note (Signed)
Reason for Consult:Fracture of pelvis Referring Physician: Hospitalist  [name] Oliver is an 80 y.o. female.  HPI: She was at church yesterday getting some item from a storage closet.  Her husband was climbing on some chairs.  He fell and fell on her.  They both fell to floor.  She was unable to stand.  X-rays in the ER show a total hip on the right with no injury but she has fracture of the right pubic rami and fracture of right sacral area.  She is unable to stand.  She has no other injury.  History reviewed. No pertinent past medical history.  Past Surgical History  Procedure Laterality Date  . Fracture surgery    . Abdominal hysterectomy    . Joint replacement      Family History  Problem Relation Age of Onset  . Diabetes Other     Social History:  reports that she has never smoked. She has never used smokeless tobacco. She reports that she does not drink alcohol or use illicit drugs.  Allergies: No Known Allergies  Medications: I have reviewed the patient's current medications.  Results for orders placed during the hospital encounter of 09/19/13 (from the past 48 hour(s))  URINALYSIS, ROUTINE W REFLEX MICROSCOPIC     Status: None   Collection Time    09/19/13  3:08 PM      Result Value Ref Range   Color, Urine YELLOW  YELLOW   APPearance CLEAR  CLEAR   Specific Gravity, Urine 1.010  1.005 - 1.030   pH 5.5  5.0 - 8.0   Glucose, UA NEGATIVE  NEGATIVE mg/dL   Hgb urine dipstick NEGATIVE  NEGATIVE   Bilirubin Urine NEGATIVE  NEGATIVE   Ketones, ur NEGATIVE  NEGATIVE mg/dL   Protein, ur NEGATIVE  NEGATIVE mg/dL   Urobilinogen, UA 0.2  0.0 - 1.0 mg/dL   Nitrite NEGATIVE  NEGATIVE   Leukocytes, UA NEGATIVE  NEGATIVE   Comment: MICROSCOPIC NOT DONE ON URINES WITH NEGATIVE PROTEIN, BLOOD, LEUKOCYTES, NITRITE, OR GLUCOSE <1000 mg/dL.  CBC WITH DIFFERENTIAL     Status: Abnormal   Collection Time    09/19/13  5:25 PM      Result Value Ref Range   WBC 21.4 (*) 4.0 - 10.5 K/uL   RBC 4.20  3.87 - 5.11 MIL/uL   Hemoglobin 13.3  12.0 - 15.0 g/dL   HCT 39.9  36.0 - 46.0 %   MCV 95.0  78.0 - 100.0 fL   MCH 31.7  26.0 - 34.0 pg   MCHC 33.3  30.0 - 36.0 g/dL   RDW 13.1  11.5 - 15.5 %   Platelets 324  150 - 400 K/uL   Neutrophils Relative % 87 (*) 43 - 77 %   Neutro Abs 18.7 (*) 1.7 - 7.7 K/uL   Lymphocytes Relative 6 (*) 12 - 46 %   Lymphs Abs 1.3  0.7 - 4.0 K/uL   Monocytes Relative 7  3 - 12 %   Monocytes Absolute 1.5 (*) 0.1 - 1.0 K/uL   Eosinophils Relative 0  0 - 5 %   Eosinophils Absolute 0.0  0.0 - 0.7 K/uL   Basophils Relative 0  0 - 1 %   Basophils Absolute 0.0  0.0 - 0.1 K/uL  BASIC METABOLIC PANEL     Status: Abnormal   Collection Time    09/19/13  5:25 PM      Result Value Ref Range   Sodium 143  137 -  147 mEq/L   Potassium 3.9  3.7 - 5.3 mEq/L   Chloride 104  96 - 112 mEq/L   CO2 26  19 - 32 mEq/L   Glucose, Bld 137 (*) 70 - 99 mg/dL   BUN 13  6 - 23 mg/dL   Creatinine, Ser 0.81  0.50 - 1.10 mg/dL   Calcium 9.7  8.4 - 10.5 mg/dL   GFR calc non Af Amer 67 (*) >90 mL/min   GFR calc Af Amer 78 (*) >90 mL/min   Comment: (NOTE)     The eGFR has been calculated using the CKD EPI equation.     This calculation has not been validated in all clinical situations.     eGFR's persistently <90 mL/min signify possible Chronic Kidney     Disease.  BASIC METABOLIC PANEL     Status: Abnormal   Collection Time    09/20/13  5:16 AM      Result Value Ref Range   Sodium 143  137 - 147 mEq/L   Potassium 4.0  3.7 - 5.3 mEq/L   Chloride 105  96 - 112 mEq/L   CO2 27  19 - 32 mEq/L   Glucose, Bld 140 (*) 70 - 99 mg/dL   BUN 12  6 - 23 mg/dL   Creatinine, Ser 0.74  0.50 - 1.10 mg/dL   Calcium 9.1  8.4 - 10.5 mg/dL   GFR calc non Af Amer 79 (*) >90 mL/min   GFR calc Af Amer >90  >90 mL/min   Comment: (NOTE)     The eGFR has been calculated using the CKD EPI equation.     This calculation has not been validated in all clinical situations.     eGFR's  persistently <90 mL/min signify possible Chronic Kidney     Disease.  CBC     Status: Abnormal   Collection Time    09/20/13  5:16 AM      Result Value Ref Range   WBC 14.3 (*) 4.0 - 10.5 K/uL   RBC 3.93  3.87 - 5.11 MIL/uL   Hemoglobin 12.4  12.0 - 15.0 g/dL   HCT 37.3  36.0 - 46.0 %   MCV 94.9  78.0 - 100.0 fL   MCH 31.6  26.0 - 34.0 pg   MCHC 33.2  30.0 - 36.0 g/dL   RDW 12.8  11.5 - 15.5 %   Platelets 277  150 - 400 K/uL    Dg Hip Complete Right  09/19/2013   CLINICAL DATA:  Right hip pain status post trauma  EXAM: RIGHT HIP - COMPLETE 2+ VIEW  COMPARISON:  None.  FINDINGS: Total right hip arthroplasty is appreciated. Hardware intact without evidence of loosening or failure. The native osseous structures demonstrate no evidence of acute fracture or dislocation. Degenerative changes appreciated within the lower lumbar spine.  IMPRESSION: No evidence of hardware no osseous abnormalities.   Electronically Signed   By: Margaree Mackintosh M.D.   On: 09/19/2013 15:39   Ct Hip Right Wo Contrast  09/19/2013   CLINICAL DATA:  Hip pain with limited angulation after falling today. History of right total hip arthroplasty.  EXAM: CT OF THE RIGHT HIP WITHOUT CONTRAST  TECHNIQUE: Multidetector CT imaging was performed according to the standard protocol. Multiplanar CT image reconstructions were also generated.  COMPARISON:  Pelvic and right hip radiographs same date.  FINDINGS: Examination is limited to the right hip and proximal femur. The entire pelvis is not imaged. There is  moderate artifact related to the right total hip arthroplasty.  There are mildly displaced acute fractures of the right inferior pubic ramus. The superior pubic ramus is largely obscured by artifact, but likely has a nondisplaced fracture. The inferior aspect of the right sacroiliac joint is visualized and is intact. However, there is a nondisplaced fracture of the right sacrum, best seen on the coronal images.  The right hip prosthesis  appears intact without evidence of dislocation. Thick heterotopic ossification is noted lateral to acetabulum. This does not appear acute as correlated with the radiographs. No hardware loosening is apparent. There is no evidence of proximal femur fracture. Of note, the distal end of the prosthesis is not imaged on this CT. It was imaged on the earlier radiographs.  There is a small amount of soft tissue hemorrhage surrounding the pubic rami fractures and tracking superiorly along the right iliac vessels. No large hematoma is demonstrated.  IMPRESSION: 1. Mildly displaced fractures of the right inferior pubic rami. Although not well seen, there is a probable coexisting superior pubic ramus fracture. 2. Nondisplaced fracture of the right sacrum. The visualized right sacroiliac joint appears intact. 3. No evidence of dislocation of the right hip prosthesis. No periprosthetic fracture identified- the distal end of the prosthesis is not imaged but appeared intact on earlier radiographs. 4. Small amount of acute hemorrhage adjacent to the fractures.   Electronically Signed   By: Camie Patience M.D.   On: 09/19/2013 17:00   Dg Chest Port 1 View  09/19/2013   CLINICAL DATA:  Cough.  EXAM: PORTABLE CHEST - 1 VIEW  COMPARISON:  03/24/2012.  FINDINGS: Cardiac silhouette, mediastinal and hilar contours are within normal limits and stable. There is mild tortuosity and ectasia of the thoracic aorta. The lungs are clear of acute process. Mild chronic bronchitic changes. No focal infiltrates or pleural effusion. The bony thorax is intact.  IMPRESSION: No acute cardiopulmonary findings.  Chronic bronchitic changes.   Electronically Signed   By: Kalman Jewels M.D.   On: 09/19/2013 18:44    Review of Systems  Musculoskeletal: Positive for falls (Fall yesterday at church.).       Post right total hip in past.   Blood pressure 191/92, pulse 81, temperature 97.6 F (36.4 C), temperature source Oral, resp. rate 18, height 5'  5.2" (1.656 m), weight 59 kg (130 lb 1.1 oz), SpO2 94.00%. Physical Exam  Constitutional: She is oriented to person, place, and time. She appears well-developed and well-nourished.  HENT:  Head: Normocephalic and atraumatic.  Eyes: Conjunctivae and EOM are normal. Pupils are equal, round, and reactive to light.  Neck: Normal range of motion. Neck supple.  Cardiovascular: Normal rate, regular rhythm and intact distal pulses.   Respiratory: Effort normal.  GI: Soft.  Musculoskeletal: She exhibits tenderness (pain with movement of the right lower extremity.  NV intact.  Leg lengths equal.).       Right hip: She exhibits decreased range of motion, tenderness and bony tenderness.       Legs: Neurological: She is alert and oriented to person, place, and time. She has normal reflexes.  Skin: Skin is warm and dry.  Psychiatric: She has a normal mood and affect. Her behavior is normal. Judgment and thought content normal.    Assessment/Plan: Acute fracture of the right pelvis and sacrum.  Post right total hip with no new injury.  I have ordered foley as she is having marked urgency and trying to urinate and having pain in  positioning in bed on pan.  She also had some hematuria which is not unexpected.  I believe she has hematoma pushing against bladder.  I have ordered PCA as the oral Percocet upsets her stomach.  She can control the medicine.  I have ordered PT.  I have ordered overhead frame.  Foley could be removed tomorrow.    Sanjuana Kava 09/20/2013, 8:31 AM

## 2013-09-20 NOTE — Care Management Note (Signed)
    Page 1 of 1   09/24/2013     3:05:37 PM CARE MANAGEMENT NOTE 09/24/2013  Patient:  [name] Oliver Oliver,[name] Oliver   Account Number:  [id number]  Date Initiated:  09/20/2013  Documentation initiated by:  Anibal HendersonBOLDEN,GENEVA  Subjective/Objective Assessment:   Admitted with Fx pelvis. Pt in excessive pain and was placed on PCA, but has a problem with percocet, and does not want to use medication. Daughter in with pt. Daughter says pt has had some cognitive issues in the past several months.She is     Action/Plan:   not sure her mom understands the PCA button/pain relationship. She feels her mom will have to go to Rehab because [place] and her father is 78 yrs old, and not really cabable of caring for the patient at home,   Anticipated DC Date:  09/23/2013   Anticipated DC Plan:  SKILLED NURSING FACILITY  In-house referral  Clinical Social Worker      DC Planning Services  CM consult      Choice offered to / List presented to:             Status of service:  Completed, signed off Medicare Important Message given?  YES (If response is "NO", the following Medicare IM given date fields will be blank) Date Medicare IM given:  09/24/2013 Date Additional Medicare IM given:    Discharge Disposition:  SKILLED NURSING FACILITY  Per UR Regulation:  Reviewed for med. necessity/level of care/duration of stay  If discussed at Long Length of Stay Meetings, dates discussed:    Comments:  09/24/13 1500 [name] Oliver Queenammy Aikeem Lilley, RN BSN CM Pt discharged to [facility] today. CSW to arrange discharge to facility.  09/20/13 1645 Geneva Bolden RN/CM   until she can care for herself, with very little assistance. She fears the new cognitive difficulties will make the pain issues more difficult, also. Daughter told CM all of this outside the room. The patient stated that she would discuss home vs rehab with daughter and spouse

## 2013-09-20 NOTE — Progress Notes (Signed)
Patient ID: [name] Oliver  female  JYN:[id number]    DOB: 03/24/1933    DOA: 09/19/2013  PCP: Kirstie PeriSHAH,ASHISH, MD  Assessment/Plan: Principal Problem:   Pelvic fracture with mechanical fall - Orthopedic surgery consulted, patient was seen by Dr. Hilda LiasKeeling this morning - No surgery recommended, placed on morphine PCA pump, oral Percocet had caused significant GI upset. - PT evaluation - I have discussed in detail with patient's daughter and the husband at the bedside, will likely need rehabilitation - Foley catheter  Active Problems:   Fall -As #1     Leukocytosis: no UTI likely reactive, improving today, afebrile  Accelerated hypertension: Worsened due to pain - Placed on oral metoprolol BID, added labetalol as needed  DVT Prophylaxis:  Code Status:  Family Communication:  Disposition:  Consultants:  Orthopedics  Procedures:  None  Antibiotics:      Subjective: Patient seen and examined, BP in control, still complaining of pain, willing to work with morphine PCA, daughter and husband at the bedside  Objective: Weight change:  No intake or output data in the 24 hours ending 09/20/13 1058 Blood pressure 160/78, pulse 78, temperature 97.6 F (36.4 C), temperature source Oral, resp. rate 18, height 5' 5.2" (1.656 m), weight 59 kg (130 lb 1.1 oz), SpO2 96.00%.  Physical Exam: General: Alert and awake, oriented , not in any acute distress. CVS: S1-S2 clear, no murmur rubs or gallops Chest: clear to auscultation bilaterally, no wheezing, rales or rhonchi Abdomen: soft nontender, nondistended, normal bowel sounds  Extremities: no cyanosis, clubbing or edema noted bilaterally   Lab Results: Basic Metabolic Panel:  Recent Labs Lab 09/19/13 1725 09/20/13 0516  NA 143 143  K 3.9 4.0  CL 104 105  CO2 26 27  GLUCOSE 137* 140*  BUN 13 12  CREATININE 0.81 0.74  CALCIUM 9.7 9.1   Liver Function Tests: No results found for this basename: AST, ALT, ALKPHOS, BILITOT,  PROT, ALBUMIN,  in the last 168 hours No results found for this basename: LIPASE, AMYLASE,  in the last 168 hours No results found for this basename: AMMONIA,  in the last 168 hours CBC:  Recent Labs Lab 09/19/13 1725 09/20/13 0516  WBC 21.4* 14.3*  NEUTROABS 18.7*  --   HGB 13.3 12.4  HCT 39.9 37.3  MCV 95.0 94.9  PLT 324 277   Cardiac Enzymes: No results found for this basename: CKTOTAL, CKMB, CKMBINDEX, TROPONINI,  in the last 168 hours BNP: No components found with this basename: POCBNP,  CBG: No results found for this basename: GLUCAP,  in the last 168 hours   Micro Results: No results found for this or any previous visit (from the past 240 hour(s)).  Studies/Results: Dg Hip Complete Right  09/19/2013   CLINICAL DATA:  Right hip pain status post trauma  EXAM: RIGHT HIP - COMPLETE 2+ VIEW  COMPARISON:  None.  FINDINGS: Total right hip arthroplasty is appreciated. Hardware intact without evidence of loosening or failure. The native osseous structures demonstrate no evidence of acute fracture or dislocation. Degenerative changes appreciated within the lower lumbar spine.  IMPRESSION: No evidence of hardware no osseous abnormalities.   Electronically Signed   By: Salome HolmesHector  Cooper M.D.   On: 09/19/2013 15:39   Ct Hip Right Wo Contrast  09/19/2013   CLINICAL DATA:  Hip pain with limited angulation after falling today. History of right total hip arthroplasty.  EXAM: CT OF THE RIGHT HIP WITHOUT CONTRAST  TECHNIQUE: Multidetector CT imaging was performed according  to the standard protocol. Multiplanar CT image reconstructions were also generated.  COMPARISON:  Pelvic and right hip radiographs same date.  FINDINGS: Examination is limited to the right hip and proximal femur. The entire pelvis is not imaged. There is moderate artifact related to the right total hip arthroplasty.  There are mildly displaced acute fractures of the right inferior pubic ramus. The superior pubic ramus is largely  obscured by artifact, but likely has a nondisplaced fracture. The inferior aspect of the right sacroiliac joint is visualized and is intact. However, there is a nondisplaced fracture of the right sacrum, best seen on the coronal images.  The right hip prosthesis appears intact without evidence of dislocation. Thick heterotopic ossification is noted lateral to acetabulum. This does not appear acute as correlated with the radiographs. No hardware loosening is apparent. There is no evidence of proximal femur fracture. Of note, the distal end of the prosthesis is not imaged on this CT. It was imaged on the earlier radiographs.  There is a small amount of soft tissue hemorrhage surrounding the pubic rami fractures and tracking superiorly along the right iliac vessels. No large hematoma is demonstrated.  IMPRESSION: 1. Mildly displaced fractures of the right inferior pubic rami. Although not well seen, there is a probable coexisting superior pubic ramus fracture. 2. Nondisplaced fracture of the right sacrum. The visualized right sacroiliac joint appears intact. 3. No evidence of dislocation of the right hip prosthesis. No periprosthetic fracture identified- the distal end of the prosthesis is not imaged but appeared intact on earlier radiographs. 4. Small amount of acute hemorrhage adjacent to the fractures.   Electronically Signed   By: Roxy HorsemanBill  Veazey M.D.   On: 09/19/2013 17:00   Dg Chest Port 1 View  09/19/2013   CLINICAL DATA:  Cough.  EXAM: PORTABLE CHEST - 1 VIEW  COMPARISON:  03/24/2012.  FINDINGS: Cardiac silhouette, mediastinal and hilar contours are within normal limits and stable. There is mild tortuosity and ectasia of the thoracic aorta. The lungs are clear of acute process. Mild chronic bronchitic changes. No focal infiltrates or pleural effusion. The bony thorax is intact.  IMPRESSION: No acute cardiopulmonary findings.  Chronic bronchitic changes.   Electronically Signed   By: Loralie ChampagneMark  Gallerani M.D.   On:  09/19/2013 18:44    Medications: Scheduled Meds: . hydrALAZINE  10 mg Intravenous Once  . metoprolol tartrate  25 mg Oral BID  . morphine   Intravenous 6 times per day  . traZODone  100 mg Oral QHS      LOS: 1 day   Isabel Ardila Jenna LuoK Brynnlie Unterreiner M.D. [facility] 09/20/2013, 10:58 AM Pager: [phone]  If 7PM-7AM, please contact night-coverage [email or web address] Password [email or web address]  **Disclaimer: This note was dictated with voice recognition software. Similar sounding words can inadvertently be transcribed and this note may contain transcription errors which may not have been corrected upon publication of note.**

## 2013-09-20 NOTE — Progress Notes (Signed)
UR completed. Patient changed to inpatient- PCA

## 2013-09-20 NOTE — Progress Notes (Signed)
Patients BP this morning was 191/92, paged the on-call physician, got a call back and will follow orders to give IV hydralazine.

## 2013-09-20 NOTE — Evaluation (Signed)
Physical Therapy Evaluation Patient Details Name: [name] Oliver MRN: [id number] DOB: 11/22/1932 Today's Date: 09/20/2013   History of Present Illness  Pt is a 80 year old female who fell at church and sustained a right pelvic fx.  She has had a RTHR previously which has been unaffected.  Pt lives with her husband and daughter is very concerned about a general decline in the pt over the past 18 months.  She reports that the pt has NO endurance and stays at home most of the time, balance is decreased and her cognition is altered to where pt is often confused or makes no sense.  She often drags her left foot.  Clinical Impression   Pt was seen for evaluation.  She was cooperative but preferred to keep her eyes closed as much as possible.  She had a very flat affect but was able to try to follow some directions.  Pt had no pain at rest but had severe right groin pain with any movement in the bed.  She was started on very gentle AA ROM exercise in the bed which was fairly tolerated.  It took total assist to transfer her from supine to sit and she needed assist to maintain sitting.  She could only tolerate sitting for about 30 seconds before becoming dizzy and asking to lie back down.  BP supine=172/82, BP seated=159/89.  Several attempts to sit had the same result and we were totally unable to progress her OOB.  We attempted to place the bed in a chair position but she could only tolerate 20% of this motion before begging to be placed supine.  Pt does have significant weakness in the left peroneal and gastrocnemius muscle---per husband, pt does have a hx of spinal dysfunction.  Otherwise, she appears to be generally very deconditioned and not cognitively whole.  Her daughter is very concerned about this pt's condition and would like for some of these concomitant issues to be investigated, if possible.  From my perspective, pt will need SNF at d/c unless she radically improves.  Daughter and husband are in  agreement.     Follow Up Recommendations SNF    Equipment Recommendations  Rolling walker with 5" wheels    Recommendations for Other Services   OT if approved by Dr. Hilda LiasKeeling    Precautions / Restrictions Precautions Precautions: Fall Restrictions Weight Bearing Restrictions: No Other Position/Activity Restrictions: WBAT right      Mobility  Bed Mobility Overal bed mobility: Needs Assistance Bed Mobility: Supine to Sit     Supine to sit: Total assist     General bed mobility comments: pt is unable to assist in transfer...once sitting, pt is unable to sit any longer that about 30 seconds without c/o severe dizziness  Transfers                 General transfer comment: unable to get to standing due to dizziness  Ambulation/Gait:  unable                Stairs:  N/A                       Balance Overall balance assessment: Needs assistance Sitting-balance support: Bilateral upper extremity supported;Feet supported Sitting balance-Leahy Scale: Fair  Home Living Family/patient expects to be discharged to:: Skilled nursing facility                      Prior Function Level of Independence: Independent                       Extremity/Trunk Assessment               Lower Extremity Assessment: Generalized weakness    Left peroneal and gastrocnemius strength=2/5     Communication   Communication: No difficulties  Cognition Arousal/Alertness: Lethargic Behavior During Therapy: Flat affect Overall Cognitive Status: Within Functional Limits for tasks assessed                            Exercises        Assessment/Plan    PT Assessment Patient needs continued PT services  PT Diagnosis Difficulty walking;Generalized weakness;Acute pain   PT Problem List Decreased strength;Decreased activity tolerance;Decreased balance;Decreased  mobility;Decreased knowledge of use of DME;Decreased safety awareness  PT Treatment Interventions DME instruction;Gait training;Functional mobility training;Therapeutic exercise;Patient/family education   PT Goals (Current goals can be found in the Care Plan section) Acute Rehab PT Goals Patient Stated Goal: none stated PT Goal Formulation: With family Time For Goal Achievement: 10/04/13 Potential to Achieve Goals: Good    Frequency 7X/week   Barriers to discharge   none                   End of Session   Activity Tolerance: Patient limited by pain;Other (comment) (limited by dizziness) Patient left: in bed;with call bell/phone within reach;with bed alarm set Nurse Communication: Mobility status    Functional Assessment Tool Used: clinical judgement Functional Limitation: Mobility: Walking and moving around Mobility: Walking and Moving Around Current Status ([id number]): 100 percent impaired, limited or restricted Mobility: Walking and Moving Around Goal Status ([id number]): At least 20 percent but less than 40 percent impaired, limited or restricted    Time: 1310-1408 PT Time Calculation (min): 58 min   Charges:   PT Evaluation $Initial PT Evaluation Tier I: 1 Procedure PT Treatments $Therapeutic Exercise: 8-22 mins   PT G Codes:   Functional Assessment Tool Used: clinical judgement Functional Limitation: Mobility: Walking and moving around    [facility] GroupClaudia L Vicki Chaffin 09/20/2013, 2:21 PM

## 2013-09-21 ENCOUNTER — Inpatient Hospital Stay (HOSPITAL_COMMUNITY)
Admit: 2013-09-21 | Discharge: 2013-09-21 | Disposition: A | Discharge status: Home / Self Care | Payer: Medicare Other | Attending: Neurology | Admitting: Neurology

## 2013-09-21 DIAGNOSIS — S329XXA Fracture of unspecified parts of lumbosacral spine and pelvis, initial encounter for closed fracture: Secondary | ICD-10-CM | POA: Diagnosis not present

## 2013-09-21 DIAGNOSIS — W19XXXA Unspecified fall, initial encounter: Secondary | ICD-10-CM | POA: Diagnosis not present

## 2013-09-21 DIAGNOSIS — R569 Unspecified convulsions: Secondary | ICD-10-CM | POA: Diagnosis not present

## 2013-09-21 DIAGNOSIS — D72829 Elevated white blood cell count, unspecified: Secondary | ICD-10-CM | POA: Diagnosis not present

## 2013-09-21 DIAGNOSIS — R269 Unspecified abnormalities of gait and mobility: Secondary | ICD-10-CM | POA: Diagnosis not present

## 2013-09-21 DIAGNOSIS — R4182 Altered mental status, unspecified: Secondary | ICD-10-CM | POA: Diagnosis not present

## 2013-09-21 DIAGNOSIS — I10 Essential (primary) hypertension: Secondary | ICD-10-CM | POA: Diagnosis not present

## 2013-09-21 LAB — BASIC METABOLIC PANEL
BUN: 14 mg/dL (ref 6–23)
CO2: 27 mEq/L (ref 19–32)
CREATININE: 0.76 mg/dL (ref 0.50–1.10)
Calcium: 9.2 mg/dL (ref 8.4–10.5)
Chloride: 100 mEq/L (ref 96–112)
GFR calc non Af Amer: 78 mL/min — ABNORMAL LOW (ref >90)
Glucose, Bld: 98 mg/dL (ref 70–99)
Potassium: 4 mEq/L (ref 3.7–5.3)
Sodium: 138 mEq/L (ref 137–147)

## 2013-09-21 LAB — CBC
HCT: 34.5 % — ABNORMAL LOW (ref 36.0–46.0)
Hemoglobin: 11.6 g/dL — ABNORMAL LOW (ref 12.0–15.0)
MCH: 31.9 pg (ref 26.0–34.0)
MCHC: 33.6 g/dL (ref 30.0–36.0)
MCV: 94.8 fL (ref 78.0–100.0)
PLATELETS: 231 10*3/uL (ref 150–400)
RBC: 3.64 MIL/uL — ABNORMAL LOW (ref 3.87–5.11)
RDW: 13.1 % (ref 11.5–15.5)
WBC: 10.2 10*3/uL (ref 4.0–10.5)

## 2013-09-21 LAB — VITAMIN B12: Vitamin B-12: 264 pg/mL (ref 211–911)

## 2013-09-21 LAB — HOMOCYSTEINE: Homocysteine: 12.1 umol/L (ref 4.0–15.4)

## 2013-09-21 LAB — TSH: TSH: 3.81 u[IU]/mL (ref 0.350–4.500)

## 2013-09-21 LAB — RPR

## 2013-09-21 MED ORDER — ENOXAPARIN SODIUM 40 MG/0.4ML ~~LOC~~ SOLN
40.0000 mg | SUBCUTANEOUS | Status: DC
  Administered 2013-09-21 – 2013-09-24 (×4): 40 mg via SUBCUTANEOUS
  Filled 2013-09-21 (×4): qty 0.4

## 2013-09-21 NOTE — Progress Notes (Signed)
Patient ID: [name] Oliver  female  ZOX:[id number]    DOB: 07/13/1932    DOA: 09/19/2013  PCP: Kirstie PeriSHAH,ASHISH, MD  Assessment/Plan: Principal Problem:   Pelvic fracture with mechanical fall - Orthopedic surgery consulted, patient was seen by Dr. Hilda LiasKeeling - No surgery recommended, placed on morphine PCA pump, oral Percocet had caused significant GI upset. - PT evaluation today - Will defer to Dr. Hilda LiasKeeling for weaning PCA, she appears to be significantly better controlled with pain today and will start physical therapy  Active Problems:   Fall -As #1     Leukocytosis: Resolved, afebrile no UTI likely reactive  Accelerated hypertension: Worsened due to pain - BP now borderline, discontinued metoprolol, continue IV antihypertensives as needed  Failure to thrive with cognitive decline  - Patient's daughter reported that patient has been cognitively declining in the last 1 year, has been having some shuffling gait, failure to thrive. She requested for possible neurology consultation. I discussed with Dr. Gerilyn Pilgrimoonquah who will see the patient.   DVT Prophylaxis:  Code Status:  Family Communication:  Disposition:  Consultants:  Orthopedics  Procedures:  None  Antibiotics:      Subjective: Patient seen and examined, alert and oriented, significantly improved today, daughter at the bedside  Objective: Weight change: -0.304 kg (-10.7 oz)  Intake/Output Summary (Last 24 hours) at 09/21/13 0940 Last data filed at 09/21/13 0906  Gross per 24 hour  Intake    550 ml  Output    400 ml  Net    150 ml   Blood pressure 93/56, pulse 76, temperature 98.1 F (36.7 C), temperature source Oral, resp. rate 13, height 5' 5.2" (1.656 m), weight 63.2 kg (139 lb 5.3 oz), SpO2 95.00%.  Physical Exam: General: Alert and awake, pleasant oriented , not in any acute distress. CVS: S1-S2 clear, no murmur rubs or gallops Chest: clear to auscultation bilaterally, no wheezing, rales or rhonchi Abdomen:  soft nontender, nondistended, normal bowel sounds  Extremities: no cyanosis, clubbing or edema noted bilaterally   Lab Results: Basic Metabolic Panel:  Recent Labs Lab 09/20/13 0516 09/21/13 0520  NA 143 138  K 4.0 4.0  CL 105 100  CO2 27 27  GLUCOSE 140* 98  BUN 12 14  CREATININE 0.74 0.76  CALCIUM 9.1 9.2   Liver Function Tests: No results found for this basename: AST, ALT, ALKPHOS, BILITOT, PROT, ALBUMIN,  in the last 168 hours No results found for this basename: LIPASE, AMYLASE,  in the last 168 hours No results found for this basename: AMMONIA,  in the last 168 hours CBC:  Recent Labs Lab 09/19/13 1725 09/20/13 0516 09/21/13 0520  WBC 21.4* 14.3* 10.2  NEUTROABS 18.7*  --   --   HGB 13.3 12.4 11.6*  HCT 39.9 37.3 34.5*  MCV 95.0 94.9 94.8  PLT 324 277 231   Cardiac Enzymes: No results found for this basename: CKTOTAL, CKMB, CKMBINDEX, TROPONINI,  in the last 168 hours BNP: No components found with this basename: POCBNP,  CBG: No results found for this basename: GLUCAP,  in the last 168 hours   Micro Results: No results found for this or any previous visit (from the past 240 hour(s)).  Studies/Results: Dg Hip Complete Right  09/19/2013   CLINICAL DATA:  Right hip pain status post trauma  EXAM: RIGHT HIP - COMPLETE 2+ VIEW  COMPARISON:  None.  FINDINGS: Total right hip arthroplasty is appreciated. Hardware intact without evidence of loosening or failure. The native osseous structures  demonstrate no evidence of acute fracture or dislocation. Degenerative changes appreciated within the lower lumbar spine.  IMPRESSION: No evidence of hardware no osseous abnormalities.   Electronically Signed   By: Salome HolmesHector  Cooper M.D.   On: 09/19/2013 15:39   Ct Hip Right Wo Contrast  09/19/2013   CLINICAL DATA:  Hip pain with limited angulation after falling today. History of right total hip arthroplasty.  EXAM: CT OF THE RIGHT HIP WITHOUT CONTRAST  TECHNIQUE: Multidetector CT  imaging was performed according to the standard protocol. Multiplanar CT image reconstructions were also generated.  COMPARISON:  Pelvic and right hip radiographs same date.  FINDINGS: Examination is limited to the right hip and proximal femur. The entire pelvis is not imaged. There is moderate artifact related to the right total hip arthroplasty.  There are mildly displaced acute fractures of the right inferior pubic ramus. The superior pubic ramus is largely obscured by artifact, but likely has a nondisplaced fracture. The inferior aspect of the right sacroiliac joint is visualized and is intact. However, there is a nondisplaced fracture of the right sacrum, best seen on the coronal images.  The right hip prosthesis appears intact without evidence of dislocation. Thick heterotopic ossification is noted lateral to acetabulum. This does not appear acute as correlated with the radiographs. No hardware loosening is apparent. There is no evidence of proximal femur fracture. Of note, the distal end of the prosthesis is not imaged on this CT. It was imaged on the earlier radiographs.  There is a small amount of soft tissue hemorrhage surrounding the pubic rami fractures and tracking superiorly along the right iliac vessels. No large hematoma is demonstrated.  IMPRESSION: 1. Mildly displaced fractures of the right inferior pubic rami. Although not well seen, there is a probable coexisting superior pubic ramus fracture. 2. Nondisplaced fracture of the right sacrum. The visualized right sacroiliac joint appears intact. 3. No evidence of dislocation of the right hip prosthesis. No periprosthetic fracture identified- the distal end of the prosthesis is not imaged but appeared intact on earlier radiographs. 4. Small amount of acute hemorrhage adjacent to the fractures.   Electronically Signed   By: Roxy HorsemanBill  Veazey M.D.   On: 09/19/2013 17:00   Dg Chest Port 1 View  09/19/2013   CLINICAL DATA:  Cough.  EXAM: PORTABLE CHEST - 1  VIEW  COMPARISON:  03/24/2012.  FINDINGS: Cardiac silhouette, mediastinal and hilar contours are within normal limits and stable. There is mild tortuosity and ectasia of the thoracic aorta. The lungs are clear of acute process. Mild chronic bronchitic changes. No focal infiltrates or pleural effusion. The bony thorax is intact.  IMPRESSION: No acute cardiopulmonary findings.  Chronic bronchitic changes.   Electronically Signed   By: Loralie ChampagneMark  Gallerani M.D.   On: 09/19/2013 18:44    Medications: Scheduled Meds: . hydrALAZINE  10 mg Intravenous Once  . metoprolol tartrate  25 mg Oral BID  . morphine   Intravenous 6 times per day  . traZODone  100 mg Oral QHS      LOS: 2 days   Shantai Tiedeman Jenna LuoK Clarice Zulauf M.D. [facility] 09/21/2013, 9:40 AM Pager: [phone]  If 7PM-7AM, please contact night-coverage [email or web address] Password [email or web address]  **Disclaimer: This note was dictated with voice recognition software. Similar sounding words can inadvertently be transcribed and this note may contain transcription errors which may not have been corrected upon publication of note.**
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
## 2013-09-21 NOTE — Clinical Social Work Placement (Cosign Needed)
Clinical Social Work Department CLINICAL SOCIAL WORK PLACEMENT NOTE 09/21/2013  Patient:  [name] Oliver,[name] Oliver  Account Number:  [id number] Admit date:  09/19/2013  Clinical Social Worker:  Cameron AliEMILY Mionna Advincula, BSW Intern  Date/time:  09/21/2013 11:23 AM  Clinical Social Work is seeking post-discharge placement for this patient at the following level of care:   SKILLED NURSING   (*CSW will update this form in Epic as items are completed)   09/21/2013  [facility]/family provided with [facility] of [facility] Work's list of facilities offering this level of care within the geographic area requested by the patient (or if unable, by the patient's family).  09/21/2013  Patient/family informed of their freedom to choose among providers that offer the needed level of care, that participate in Medicare, Medicaid or managed care program needed by the patient, have an available bed and are willing to accept the patient.  09/21/2013  Patient/family informed of [facility]' ownership interest in [facility], as well as of the fact that they are under no obligation to receive care at this facility.  PASARR submitted to EDS on  PASARR number received from EDS on   FL2 transmitted to all facilities in geographic area requested by pt/family on  09/21/2013 FL2 transmitted to all facilities within larger geographic area on 09/21/2013  Patient informed that his/her managed care company has contracts with or will negotiate with  certain facilities, including the following:     Patient/family informed of bed offers received:   Patient chooses bed at  Physician recommends and patient chooses bed at    Patient to be transferred to  on   Patient to be transferred to facility by   The following physician request were entered in Epic:   Additional Comments: Pt has existing PASARR
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
## 2013-09-21 NOTE — Progress Notes (Signed)
Physical Therapy Treatment Patient Details Name: [name] Oliver MRN: [id number] DOB: 06/18/1932 Today's Date: 09/21/2013    History of Present Illness      PT Comments    Pt cooperative and eager to participate with therapy today.  Pt completed all bed exercises AROM/AAROM for Rt LE with no c/o pain.  Bed mobility with min assistance and cueing for handplacement to assist.  Upon sitting pt c/o dizziness, pt sat on EOB for 3 minutes with no reduction in dizziness.  BP in sitting at 93/56.  Pt requested to lay back down with c/o dizziness in supine.  Nursing informed.  Pt left in bed with call bell within reach and family members in room.  Follow Up Recommendations        Equipment Recommendations       Recommendations for Other Services       Precautions / Restrictions Precautions Precautions: Fall Restrictions Weight Bearing Restrictions: No Other Position/Activity Restrictions: WBAT right    Mobility  Bed Mobility Overal bed mobility: Needs Assistance Bed Mobility: Supine to Sit     Supine to sit: Min assist     General bed mobility comments: verbal cueing for handplacement to assist with bed mobility, min assistance with LE supine to sit with c/o severe dizziness upon initial supine to sit. Pt sat by EOB for 3 minutes with no reduction in dizziness  Transfers                    Ambulation/Gait                 Stairs            Wheelchair Mobility    Modified Rankin (Stroke Patients Only)       Balance                                    Cognition Arousal/Alertness: Awake/alert Behavior During Therapy: WFL for tasks assessed/performed Overall Cognitive Status: Within Functional Limits for tasks assessed                      Exercises General Exercises - Lower Extremity Ankle Circles/Pumps: AROM;Both;10 reps;Supine Quad Sets: AROM;Both;10 reps;Supine Gluteal Sets: AROM;Both;10 reps;Supine Short Arc Quad:  AROM;Both;10 reps;Supine Heel Slides: AROM;Left;AAROM;Right;10 reps;Supine    General Comments        Pertinent Vitals/Pain No c/o of pain through supine exercises, pt c/o Rt buttocks pain scale range from 7-3/10 at end of session.    Home Living                      Prior Function            PT Goals (current goals can now be found in the care plan section)      Frequency       PT Plan Current plan remains appropriate    Co-evaluation             End of Session   Activity Tolerance: Patient tolerated treatment well;Patient limited by fatigue Patient left: in bed;with call bell/phone within reach;with family/visitor present     Time: 0850-0930 PT Time Calculation (min): 40 min  Charges:  $Therapeutic Exercise: 8-22 mins $Therapeutic Activity: 8-22 mins                    G Codes:  [name] Oliver 09/21/2013, 9:38 AM

## 2013-09-21 NOTE — Plan of Care (Signed)
Problem: Acute Rehab PT Goals(only PT should resolve) Goal: Patient Will Transfer Sit To/From Stand Outcome: Not Progressing Pt c/o dizziness upon supine to sit, no transfer complete due to dizziness and request to lay back down

## 2013-09-21 NOTE — Progress Notes (Signed)
EEG Completed; Results Pending  

## 2013-09-21 NOTE — Progress Notes (Signed)
Subjective: I feel much better today.   Objective: Vital signs in last 24 hours: Temp:  [97.7 F (36.5 C)-98.1 F (36.7 C)] 98.1 F (36.7 C) (04/17 1014) Pulse Rate:  [76-88] 76 (04/17 1014) Resp:  [11-20] 12 (04/17 1014) BP: (93-155)/(56-75) 118/68 mmHg (04/17 1014) SpO2:  [91 %-97 %] 94 % (04/17 1014) Weight:  [63.2 kg (139 lb 5.3 oz)] 63.2 kg (139 lb 5.3 oz) (04/17 0644)  Intake/Output from previous day: 04/16 0701 - 04/17 0700 In: 200 [P.O.:200] Out: 400 [Urine:400] Intake/Output this shift: Total I/O In: 580 [P.O.:580] Out: 800 [Urine:800]   Recent Labs  09/19/13 1725 09/20/13 0516 09/21/13 0520  HGB 13.3 12.4 11.6*    Recent Labs  09/20/13 0516 09/21/13 0520  WBC 14.3* 10.2  RBC 3.93 3.64*  HCT 37.3 34.5*  PLT 277 231    Recent Labs  09/20/13 0516 09/21/13 0520  NA 143 138  K 4.0 4.0  CL 105 100  CO2 27 27  BUN 12 14  CREATININE 0.74 0.76  GLUCOSE 140* 98  CALCIUM 9.1 9.2   No results found for this basename: LABPT, INR,  in the last 72 hours  Neurologically intact Neurovascular intact Sensation intact distally Intact pulses distally Dorsiflexion/Plantar flexion intact  She slept well last night.  The PCA has helped significantly although she is not using it much but when she does, she gets good relief.    Therapy has had her up on side of bed but she got a little hypotensive and laid back down.  She still has pain with moving the right hip and thigh but slightly less.  I will discontinue the foley tomorrow morning.  I have ordered enoxaparin to begin today.  CBC ordered daily as well.    Assessment/Plan: Continue PT and try to ambulate.  Can go home when can get out of bed and ambulate safely with walker.      [name] Oliver 09/21/2013, 1:04 PM

## 2013-09-22 DIAGNOSIS — S329XXA Fracture of unspecified parts of lumbosacral spine and pelvis, initial encounter for closed fracture: Secondary | ICD-10-CM | POA: Diagnosis not present

## 2013-09-22 LAB — URINALYSIS, ROUTINE W REFLEX MICROSCOPIC
Bilirubin Urine: NEGATIVE
GLUCOSE, UA: NEGATIVE mg/dL
HGB URINE DIPSTICK: NEGATIVE
Ketones, ur: 15 mg/dL — AB
Leukocytes, UA: NEGATIVE
Nitrite: NEGATIVE
PH: 6 (ref 5.0–8.0)
Protein, ur: NEGATIVE mg/dL
Specific Gravity, Urine: 1.02 (ref 1.005–1.030)
Urobilinogen, UA: 0.2 mg/dL (ref 0.0–1.0)

## 2013-09-22 LAB — CBC WITH DIFFERENTIAL/PLATELET
BASOS PCT: 0 % (ref 0–1)
Basophils Absolute: 0 10*3/uL (ref 0.0–0.1)
Eosinophils Absolute: 0.2 10*3/uL (ref 0.0–0.7)
Eosinophils Relative: 2 % (ref 0–5)
HCT: 35.8 % — ABNORMAL LOW (ref 36.0–46.0)
HEMOGLOBIN: 11.8 g/dL — AB (ref 12.0–15.0)
Lymphocytes Relative: 18 % (ref 12–46)
Lymphs Abs: 1.7 10*3/uL (ref 0.7–4.0)
MCH: 31.7 pg (ref 26.0–34.0)
MCHC: 33 g/dL (ref 30.0–36.0)
MCV: 96.2 fL (ref 78.0–100.0)
MONOS PCT: 14 % — AB (ref 3–12)
Monocytes Absolute: 1.3 10*3/uL — ABNORMAL HIGH (ref 0.1–1.0)
NEUTROS ABS: 6.2 10*3/uL (ref 1.7–7.7)
Neutrophils Relative %: 66 % (ref 43–77)
Platelets: 243 10*3/uL (ref 150–400)
RBC: 3.72 MIL/uL — AB (ref 3.87–5.11)
RDW: 13.2 % (ref 11.5–15.5)
WBC: 9.4 10*3/uL (ref 4.0–10.5)

## 2013-09-22 NOTE — Progress Notes (Signed)
Subjective: I still have pain, how long will this take?   Objective: Vital signs in last 24 hours: Temp:  [98.1 F (36.7 C)-98.5 F (36.9 C)] 98.4 F (36.9 C) (04/18 0636) Pulse Rate:  [76-89] 89 (04/18 0636) Resp:  [10-20] 11 (04/18 0636) BP: (93-170)/(56-87) 170/87 mmHg (04/18 0636) SpO2:  [91 %-96 %] 96 % (04/18 0636) Weight:  [61.5 kg (135 lb 9.3 oz)] 61.5 kg (135 lb 9.3 oz) (04/18 0636)  Intake/Output from previous day: 04/17 0701 - 04/18 0700 In: 821.5 [P.O.:810; I.V.:11.5] Out: 1300 [Urine:1300] Intake/Output this shift:     Recent Labs  09/19/13 1725 09/20/13 0516 09/21/13 0520 09/22/13 0622  HGB 13.3 12.4 11.6* 11.8*    Recent Labs  09/21/13 0520 09/22/13 0622  WBC 10.2 9.4  RBC 3.64* 3.72*  HCT 34.5* 35.8*  PLT 231 243    Recent Labs  09/20/13 0516 09/21/13 0520  NA 143 138  K 4.0 4.0  CL 105 100  CO2 27 27  BUN 12 14  CREATININE 0.74 0.76  GLUCOSE 140* 98  CALCIUM 9.1 9.2   No results found for this basename: LABPT, INR,  in the last 72 hours  Neurologically intact Neurovascular intact Sensation intact distally Intact pulses distally Dorsiflexion/Plantar flexion intact  She is not using the PCA that much.  I have told her to use it.  I went over it with her husband also.    She is seeking SNF placement.  She will need this.  She is making slow progress with PT.  Assessment/Plan: Fracture pelvis, continue with PT.   Darreld McleanWayne Jahzaria Vary 09/22/2013, 9:00 AM

## 2013-09-22 NOTE — Progress Notes (Signed)
Patient progressing slowly towards the goals , demonstrated decreased ability with weight shifting in standing to initiate with gait training, dc plans to SNF which is appropriate at this point to continue working on transfers, bed mobility and gait training to achieved increased level of Independence.

## 2013-09-22 NOTE — Progress Notes (Signed)
Dr. Irene LimboGoodrich informed of patient having to get on the bedpan to urinate around every hour with voided amounts ranging from 50-100cc's, orders received for I&O cath and urine to lab for UA.

## 2013-09-22 NOTE — Progress Notes (Signed)
  PROGRESS NOTE  [name] Oliver Dais UJW:[id number] DOB: 11/15/1932 DOA: 09/19/2013 PCP: Kirstie PeriSHAH,ASHISH, MD  Summary: 80 year old woman with uncomplicated past medical history presented after a mechanical fall resulting in right hip pain. CT scan revealed right inferior and superior pelvic fractures along with sacral fracture and she was admitted for pain control and orthopedic evaluation. For pain control she was placed on PCA and seen by physical therapy  Assessment/Plan: 1. Right-sided pelvic fractures, sacral fracture secondary to mechanical fall. Slowly improving. 2. Mild cognitive impairment not meeting criteria for dementia per neurology. EEG ordered to rule out nonconvulsive seizures. No imaging recommended.   Overall seems to be improving. Main issues pain control. Skilled nursing facility placement anticipated 4/20. Orthopedics has recommended PCA, would like to transition to oral medication 4/19 in anticipation of discharge. Will discuss.  Code Status: full code DVT prophylaxis: Lovenox Family Communication: none present Disposition Plan: home when improved  Brendia Sacksaniel Waverly Chavarria, MD  [facility]  Pager [phone] If 7PM-7AM, please contact night-coverage at [email or web address], password [email or web address] 09/22/2013, 5:12 PM  LOS: 3 days   Consultants:  Orthopedics   Neurology  Procedures:  EEG, results pending  HPI/Subjective: She is feeling better today with less pain. Eating okay. No nausea or vomiting.   Objective: Filed Vitals:   09/22/13 1111 09/22/13 1347 09/22/13 1351 09/22/13 1600  BP:  122/72    Pulse:  79    Temp:  98 F (36.7 C)    TempSrc:  Oral Oral   Resp: 12 10  12   Height:      Weight:      SpO2: 98% 90%  93%    Intake/Output Summary (Last 24 hours) at 09/22/13 1712 Last data filed at 09/22/13 1300  Gross per 24 hour  Intake 691.47 ml  Output    500 ml  Net 191.47 ml     Filed Weights   09/20/13 0559 09/21/13 0644 09/22/13 0636  Weight: 59 kg (130 lb 1.1 oz)  63.2 kg (139 lb 5.3 oz) 61.5 kg (135 lb 9.3 oz)    Exam:   Afebrile, vital signs are stable. No hypoxia.  Gen. Appears calm and comfortable. Speech fluent and clear. Follows commands.  Respiratory clear to auscultation bilaterally. No wheezes, rales or rhonchi. Normal respiratory effort.  Cardiovascular regular rate and rhythm. No murmur, rub or gallop. No lower extremity edema.  Data Reviewed:  Hemoglobin stable, 11.8.  TSH normal.  Scheduled Meds: . enoxaparin (LOVENOX) injection  40 mg Subcutaneous Q24H  . hydrALAZINE  10 mg Intravenous Once  . morphine   Intravenous 6 times per day  . traZODone  100 mg Oral QHS   Continuous Infusions:   Principal Problem:   Pelvic fracture Active Problems:   Fall   Leukocytosis   Accelerated hypertension   Time spent 20  minutes

## 2013-09-23 DIAGNOSIS — R339 Retention of urine, unspecified: Secondary | ICD-10-CM

## 2013-09-23 DIAGNOSIS — K5900 Constipation, unspecified: Secondary | ICD-10-CM

## 2013-09-23 DIAGNOSIS — S329XXA Fracture of unspecified parts of lumbosacral spine and pelvis, initial encounter for closed fracture: Secondary | ICD-10-CM | POA: Diagnosis not present

## 2013-09-23 LAB — CBC WITH DIFFERENTIAL/PLATELET
Basophils Absolute: 0 10*3/uL (ref 0.0–0.1)
Basophils Relative: 0 % (ref 0–1)
EOS ABS: 0.1 10*3/uL (ref 0.0–0.7)
Eosinophils Relative: 1 % (ref 0–5)
HCT: 36.3 % (ref 36.0–46.0)
Hemoglobin: 12.2 g/dL (ref 12.0–15.0)
LYMPHS ABS: 1.3 10*3/uL (ref 0.7–4.0)
Lymphocytes Relative: 9 % — ABNORMAL LOW (ref 12–46)
MCH: 31.9 pg (ref 26.0–34.0)
MCHC: 33.6 g/dL (ref 30.0–36.0)
MCV: 94.8 fL (ref 78.0–100.0)
MONOS PCT: 11 % (ref 3–12)
Monocytes Absolute: 1.5 10*3/uL — ABNORMAL HIGH (ref 0.1–1.0)
NEUTROS PCT: 79 % — AB (ref 43–77)
Neutro Abs: 11 10*3/uL — ABNORMAL HIGH (ref 1.7–7.7)
Platelets: 272 10*3/uL (ref 150–400)
RBC: 3.83 MIL/uL — AB (ref 3.87–5.11)
RDW: 12.9 % (ref 11.5–15.5)
WBC: 13.9 10*3/uL — ABNORMAL HIGH (ref 4.0–10.5)

## 2013-09-23 MED ORDER — POLYETHYLENE GLYCOL 3350 17 G PO PACK
17.0000 g | PACK | Freq: Two times a day (BID) | ORAL | Status: DC
  Administered 2013-09-23 – 2013-09-24 (×3): 17 g via ORAL
  Filled 2013-09-23 (×3): qty 1

## 2013-09-23 MED ORDER — HYDROCODONE-ACETAMINOPHEN 5-325 MG PO TABS
1.0000 | ORAL_TABLET | Freq: Four times a day (QID) | ORAL | Status: DC | PRN
  Administered 2013-09-24: 1 via ORAL
  Filled 2013-09-23 (×2): qty 1

## 2013-09-23 MED ORDER — MORPHINE SULFATE 2 MG/ML IJ SOLN: 1.0000 mg | INTRAMUSCULAR | Status: DC | PRN

## 2013-09-23 MED ORDER — ACETAMINOPHEN 325 MG PO TABS: 650.0000 mg | ORAL_TABLET | Freq: Four times a day (QID) | ORAL | Status: DC | PRN

## 2013-09-23 MED ORDER — SENNA 8.6 MG PO TABS
1.0000 | ORAL_TABLET | Freq: Every day | ORAL | Status: DC
  Administered 2013-09-23: 8.6 mg via ORAL
  Filled 2013-09-23: qty 1

## 2013-09-23 MED ORDER — IBUPROFEN 800 MG PO TABS
400.0000 mg | ORAL_TABLET | Freq: Four times a day (QID) | ORAL | Status: DC
  Administered 2013-09-23 – 2013-09-24 (×5): 400 mg via ORAL
  Filled 2013-09-23 (×5): qty 1

## 2013-09-23 MED ORDER — DOCUSATE SODIUM 100 MG PO CAPS
100.0000 mg | ORAL_CAPSULE | Freq: Two times a day (BID) | ORAL | Status: DC
  Administered 2013-09-23 – 2013-09-24 (×2): 100 mg via ORAL
  Filled 2013-09-23 (×3): qty 1

## 2013-09-23 MED ORDER — SODIUM CHLORIDE 0.9 % IV SOLN
INTRAVENOUS | Status: DC
  Administered 2013-09-24: 20 mL/h via INTRAVENOUS

## 2013-09-23 NOTE — Progress Notes (Signed)
PROGRESS NOTE  [name] Oliver [id number] DOB: 07/25/1932 DOA: 09/19/2013 PCP: Kirstie PeriSHAH,ASHISH, MD  Summary: 80 year old woman with uncomplicated past medical history presented after a mechanical fall resulting in right hip pain. CT scan revealed right inferior and superior pelvic fractures along with sacral fracture and she was admitted for pain control and orthopedic evaluation. For pain control she was placed on PCA and seen by physical therapy  Assessment/Plan: 1. Right-sided pelvic fractures, sacral fracture secondary to mechanical fall. Continues to slowly improve. Progressing with physical therapy. 2. Urinary retention, constipation. Side effects of morphine PCA, ordered 4/16. 3. Mild cognitive impairment not meeting criteria for dementia per neurology. EEG ordered to rule out nonconvulsive seizures. No imaging recommended.   Given clinical improvement, constipation, urinary retention, I discussed discontinuation of PCA and the rationale for this with patient and husband. Expect slow clinical improvement with pain for the next several weeks.  Schedule ibuprofen. IV morphine PRN. Had GI upset with Percocet. Since he has tolerated morphine, try Lortab.  Bowel regimen   Voiding trial 4/20  Anticipate transfer to skilled nursing facility next 48 hours.  Discussed with husband at bedside.  Code Status: full code DVT prophylaxis: Lovenox Family Communication: none present Disposition Plan: home when improved  Brendia Sacksaniel Coralynn Gaona, MD  [facility]  Pager [phone] If 7PM-7AM, please contact night-coverage at [email or web address], password [email or web address] 09/23/2013, 10:13 AM  LOS: 4 days   Consultants:  Orthopedics   Neurology  Procedures:  EEG, results pending  HPI/Subjective: Urinary retention overnight, initially treat with in/out catheterization. When persisted, Foley catheter was reinserted.  Patient reports "I feel a little better". Pain improved. Constipated. No bowel movement for  several days.  Objective: Filed Vitals:   09/23/13 0013 09/23/13 0158 09/23/13 0500 09/23/13 0824  BP:  173/82 160/64   Pulse:  95 91   Temp:  98.5 F (36.9 C) 98.2 F (36.8 C)   TempSrc:  Oral Oral   Resp: 12 11 13 12   Height:      Weight:   61.2 kg (134 lb 14.7 oz)   SpO2: 94% 93% 96% 96%    Intake/Output Summary (Last 24 hours) at 09/23/13 1013 Last data filed at 09/23/13 0955  Gross per 24 hour  Intake    390 ml  Output   1010 ml  Net   -620 ml     Filed Weights   09/21/13 0644 09/22/13 0636 09/23/13 0500  Weight: 63.2 kg (139 lb 5.3 oz) 61.5 kg (135 lb 9.3 oz) 61.2 kg (134 lb 14.7 oz)    Exam:   Afebrile, vital signs are stable. No hypoxia.  Gen. Appears calm, comfortable. Eyes open today.  Cardiovascular regular rate and rhythm. No murmur, rub or gallop. No lower extremity edema.  Respiratory clear to auscultation bilaterally. No wheezes, rales or rhonchi. Normal respiratory effort.  Psychiatric. Grossly normal mood and affect. Speech fluent and appropriate.  Data Reviewed:  Hemoglobin stable, 12.2. Modest elevation of WBC, 13.9.  Urinalysis negative.  Scheduled Meds: . docusate sodium  100 mg Oral BID  . enoxaparin (LOVENOX) injection  40 mg Subcutaneous Q24H  . hydrALAZINE  10 mg Intravenous Once  . ibuprofen  400 mg Oral QID  . polyethylene glycol  17 g Oral BID  . senna  1 tablet Oral QHS  . traZODone  100 mg Oral QHS   Continuous Infusions:   Principal Problem:   Pelvic fracture Active Problems:   Fall   Leukocytosis   Accelerated hypertension   Urinary retention  Unspecified constipation   Time spent 15 minutes

## 2013-09-23 NOTE — Progress Notes (Signed)
Notified by patient and patient's family that she was having increased urgency to use the bedpan but when placed on the bedpan had no urine output. Bladder scanned patient and bladder scan showed 316. Paged MD. MD ordered to place a foley catheter at this time. Foley catheter was inserted by Tish, NT with nurse present. Patient tolerated the insertion well with no complications. Patient states that she feels better now. Will continue to monitor the patient.

## 2013-09-23 NOTE — Evaluation (Signed)
Physical Therapy Evaluation Patient Details Name: [name] Oliver MRN: [id number] DOB: 10/03/1932 Today's Date: 09/23/2013   History of Present Illness     Clinical Impression  Patient progressing slowly towards the goals , improving with BL LE strengthening, noted increased c/o pain over left hip than right and L LE more weak than R LE sec to pain, patient limited with easy fatigue and weakness, performed transfer from bed to recliner using the RW with max A x 2 as patient not able to weight bear on LE with transfers , able to perform scooting with greater level of Independence today.    Follow Up Recommendations      Equipment Recommendations       Recommendations for Other Services       Precautions / Restrictions        Mobility  Bed Mobility Overal bed mobility: Needs Assistance Bed Mobility: Supine to Sit     Supine to sit: Min assist Sit to supine: Min assist   General bed mobility comments: patient max cue and A for reaching for the rails and constant cues to perform the task.  Transfers Overall transfer level: Needs assistance Equipment used: Standard walker Transfers: Sit to/from BJ'sStand;Stand Pivot Transfers Sit to Stand: Mod assist Stand pivot transfers: Max assist       General transfer comment: patient performed transfer from bed to the recliner with max A x2 as patient demonstrated increased fear of fall and instability with standing and weight bearing , Assistance needed for knee extension and upright posture .  Ambulation/Gait             General Gait Details: not performed ambulation sec to patient unable to performed weight shifting and alternate LE lifting for progressing, patient decreased standing tolerance.  Stairs            Wheelchair Mobility    Modified Rankin (Stroke Patients Only)       Balance Overall balance assessment: Needs assistance Sitting-balance support: No upper extremity supported Sitting balance-Leahy Scale:  Good   Postural control: Posterior lean Standing balance support: Bilateral upper extremity supported Standing balance-Leahy Scale: Poor Standing balance comment: patient heavy leaning posteriorly while performing the transfers from Sit to stand and sliding the LE forwards resulting into increased risk of fall.                             Pertinent Vitals/Pain      Home Living                        Prior Function                 Hand Dominance        Extremity/Trunk Assessment                         Communication      Cognition Arousal/Alertness: Awake/alert Behavior During Therapy: WFL for tasks assessed/performed Overall Cognitive Status: Within Functional Limits for tasks assessed                      General Comments      Exercises Total Joint Exercises Ankle Circles/Pumps: AROM;15 reps;Supine Quad Sets: AROM;Both;10 reps;Supine Gluteal Sets: AROM;Both;10 reps;Supine Heel Slides: AROM;Both;10 reps;Supine Hip ABduction/ADduction: AROM;Both;10 reps;Supine Straight Leg Raises: AROM;Both;10 reps;Supine Knee Flexion: AROM;Both;15 reps;Seated      Assessment/Plan  PT Assessment Patient needs continued PT services  PT Diagnosis     PT Problem List Decreased strength;Decreased range of motion;Decreased activity tolerance;Decreased balance;Decreased mobility;Decreased knowledge of use of DME;Decreased safety awareness  PT Treatment Interventions     PT Goals (Current goals can be found in the Care Plan section)      Frequency     Barriers to discharge        Co-evaluation               End of Session Equipment Utilized During Treatment: Gait belt Activity Tolerance: Patient limited by fatigue;Patient limited by pain Patient left: in chair;with nursing/sitter in room Nurse Communication: Mobility status         Time: 4696-29521215-1320 PT Time Calculation (min): 65 min   Charges:     PT  Treatments $Therapeutic Exercise: 8-22 mins $Therapeutic Activity: 23-37 mins $Neuromuscular Re-education: 8-22 mins   PT G Codes:          Ok EdwardsUrvi V Veta Dambrosia 09/23/2013, 1:31 PM

## 2013-09-24 ENCOUNTER — Inpatient Hospital Stay (HOSPITAL_COMMUNITY): Payer: Medicare Other

## 2013-09-24 ENCOUNTER — Inpatient Hospital Stay
Admission: RE | Admit: 2013-09-24 | Discharge: 2013-11-05 | Disposition: A | Discharge status: Home / Self Care | Payer: BLUE CROSS/BLUE SHIELD | Source: Ambulatory Visit | Attending: Internal Medicine | Admitting: Internal Medicine

## 2013-09-24 ENCOUNTER — Other Ambulatory Visit: Payer: Self-pay | Admitting: *Deleted

## 2013-09-24 DIAGNOSIS — S79919A Unspecified injury of unspecified hip, initial encounter: Secondary | ICD-10-CM | POA: Diagnosis not present

## 2013-09-24 DIAGNOSIS — M25559 Pain in unspecified hip: Secondary | ICD-10-CM | POA: Diagnosis not present

## 2013-09-24 DIAGNOSIS — F0390 Unspecified dementia without behavioral disturbance: Secondary | ICD-10-CM | POA: Diagnosis not present

## 2013-09-24 DIAGNOSIS — R269 Unspecified abnormalities of gait and mobility: Secondary | ICD-10-CM | POA: Diagnosis not present

## 2013-09-24 DIAGNOSIS — S3282XA Multiple fractures of pelvis without disruption of pelvic ring, initial encounter for closed fracture: Secondary | ICD-10-CM | POA: Diagnosis not present

## 2013-09-24 DIAGNOSIS — N390 Urinary tract infection, site not specified: Secondary | ICD-10-CM | POA: Diagnosis not present

## 2013-09-24 DIAGNOSIS — G3184 Mild cognitive impairment, so stated: Secondary | ICD-10-CM | POA: Diagnosis not present

## 2013-09-24 DIAGNOSIS — Z9181 History of falling: Secondary | ICD-10-CM | POA: Diagnosis not present

## 2013-09-24 DIAGNOSIS — S32509A Unspecified fracture of unspecified pubis, initial encounter for closed fracture: Secondary | ICD-10-CM | POA: Diagnosis not present

## 2013-09-24 DIAGNOSIS — R41841 Cognitive communication deficit: Secondary | ICD-10-CM | POA: Diagnosis not present

## 2013-09-24 DIAGNOSIS — IMO0001 Reserved for inherently not codable concepts without codable children: Secondary | ICD-10-CM | POA: Diagnosis not present

## 2013-09-24 DIAGNOSIS — G4700 Insomnia, unspecified: Secondary | ICD-10-CM | POA: Diagnosis not present

## 2013-09-24 DIAGNOSIS — M79609 Pain in unspecified limb: Secondary | ICD-10-CM | POA: Diagnosis not present

## 2013-09-24 DIAGNOSIS — G20 Parkinson's disease: Secondary | ICD-10-CM | POA: Diagnosis not present

## 2013-09-24 DIAGNOSIS — S3210XA Unspecified fracture of sacrum, initial encounter for closed fracture: Principal | ICD-10-CM

## 2013-09-24 DIAGNOSIS — M6281 Muscle weakness (generalized): Secondary | ICD-10-CM | POA: Diagnosis not present

## 2013-09-24 DIAGNOSIS — F0280 Dementia in other diseases classified elsewhere without behavioral disturbance: Secondary | ICD-10-CM | POA: Diagnosis not present

## 2013-09-24 DIAGNOSIS — Z96649 Presence of unspecified artificial hip joint: Secondary | ICD-10-CM | POA: Diagnosis not present

## 2013-09-24 DIAGNOSIS — R339 Retention of urine, unspecified: Secondary | ICD-10-CM | POA: Diagnosis not present

## 2013-09-24 DIAGNOSIS — R262 Difficulty in walking, not elsewhere classified: Secondary | ICD-10-CM | POA: Diagnosis not present

## 2013-09-24 DIAGNOSIS — R4182 Altered mental status, unspecified: Secondary | ICD-10-CM | POA: Diagnosis not present

## 2013-09-24 DIAGNOSIS — F411 Generalized anxiety disorder: Secondary | ICD-10-CM | POA: Diagnosis not present

## 2013-09-24 DIAGNOSIS — S329XXA Fracture of unspecified parts of lumbosacral spine and pelvis, initial encounter for closed fracture: Secondary | ICD-10-CM | POA: Diagnosis not present

## 2013-09-24 DIAGNOSIS — R279 Unspecified lack of coordination: Secondary | ICD-10-CM | POA: Diagnosis not present

## 2013-09-24 LAB — URINALYSIS, ROUTINE W REFLEX MICROSCOPIC
BILIRUBIN URINE: NEGATIVE
GLUCOSE, UA: NEGATIVE mg/dL
Nitrite: POSITIVE — AB
PROTEIN: NEGATIVE mg/dL
Specific Gravity, Urine: 1.01 (ref 1.005–1.030)
Urobilinogen, UA: 0.2 mg/dL (ref 0.0–1.0)
pH: 6 (ref 5.0–8.0)

## 2013-09-24 LAB — CBC WITH DIFFERENTIAL/PLATELET
Basophils Absolute: 0 10*3/uL (ref 0.0–0.1)
Basophils Relative: 0 % (ref 0–1)
Eosinophils Absolute: 0.3 10*3/uL (ref 0.0–0.7)
Eosinophils Relative: 4 % (ref 0–5)
HEMATOCRIT: 33.2 % — AB (ref 36.0–46.0)
HEMOGLOBIN: 11.2 g/dL — AB (ref 12.0–15.0)
LYMPHS ABS: 1.5 10*3/uL (ref 0.7–4.0)
LYMPHS PCT: 17 % (ref 12–46)
MCH: 32 pg (ref 26.0–34.0)
MCHC: 33.7 g/dL (ref 30.0–36.0)
MCV: 94.9 fL (ref 78.0–100.0)
MONO ABS: 1.3 10*3/uL — AB (ref 0.1–1.0)
MONOS PCT: 14 % — AB (ref 3–12)
NEUTROS ABS: 6 10*3/uL (ref 1.7–7.7)
Neutrophils Relative %: 65 % (ref 43–77)
Platelets: 246 10*3/uL (ref 150–400)
RBC: 3.5 MIL/uL — ABNORMAL LOW (ref 3.87–5.11)
RDW: 12.8 % (ref 11.5–15.5)
WBC: 9.2 10*3/uL (ref 4.0–10.5)

## 2013-09-24 LAB — URINE MICROSCOPIC-ADD ON

## 2013-09-24 MED ORDER — SENNA 8.6 MG PO TABS: 1.0000 | ORAL_TABLET | Freq: Every day | ORAL | Status: DC

## 2013-09-24 MED ORDER — POLYETHYLENE GLYCOL 3350 17 G PO PACK: 17.0000 g | PACK | Freq: Two times a day (BID) | ORAL | Status: DC

## 2013-09-24 MED ORDER — IBUPROFEN 400 MG PO TABS: 400.0000 mg | ORAL_TABLET | Freq: Four times a day (QID) | ORAL | Status: DC

## 2013-09-24 MED ORDER — HYDROCODONE-ACETAMINOPHEN 5-325 MG PO TABS: 1.0000 | ORAL_TABLET | Freq: Four times a day (QID) | ORAL | Status: DC | PRN

## 2013-09-24 MED ORDER — FAMOTIDINE 20 MG PO TABS: 20.0000 mg | ORAL_TABLET | Freq: Two times a day (BID) | ORAL | Status: DC

## 2013-09-24 MED ORDER — DSS 100 MG PO CAPS: 100.0000 mg | ORAL_CAPSULE | Freq: Two times a day (BID) | ORAL | Status: DC

## 2013-09-24 MED ORDER — CEPHALEXIN 500 MG PO CAPS
500.0000 mg | ORAL_CAPSULE | Freq: Two times a day (BID) | ORAL | Status: DC
  Administered 2013-09-24: 500 mg via ORAL
  Filled 2013-09-24: qty 1

## 2013-09-24 MED ORDER — CEPHALEXIN 500 MG PO CAPS: 500.0000 mg | ORAL_CAPSULE | Freq: Two times a day (BID) | ORAL | Status: DC

## 2013-09-24 NOTE — Procedures (Signed)
  [facility] Kerrington [place]halgh A. Gerilyn Pilgrimoonquah, MD     [email or web address]           HISTORY: Patient is a 80 year old who presents with episodes of confusion and altered mental status. This has been done to evaluate for nonconvulsive seizures.  MEDICATIONS: Scheduled Meds: . docusate sodium  100 mg Oral BID  . enoxaparin (LOVENOX) injection  40 mg Subcutaneous Q24H  . hydrALAZINE  10 mg Intravenous Once  . ibuprofen  400 mg Oral QID  . polyethylene glycol  17 g Oral BID  . senna  1 tablet Oral QHS  . traZODone  100 mg Oral QHS   Continuous Infusions: . sodium chloride 20 mL/hr (09/24/13 0706)   PRN Meds:.acetaminophen, guaiFENesin-dextromethorphan, HYDROcodone-acetaminophen, labetalol, morphine injection, ondansetron (ZOFRAN) IV, ondansetron  Prior to Admission medications   Medication Sig Start Date End Date Taking? Authorizing Provider  traZODone (DESYREL) 100 MG tablet Take 100 mg by mouth at bedtime. 08/20/13  Yes Historical Provider, MD      ANALYSIS: A 16 channel recording using standard 10 20 measurements is conducted for  21 minutes. The background activity consists Ison and a half to 8 Hz which attenuates with eye imaging. There is beta activity observed in the frontal areas. Awake and drowsy activities are recorded. Photic stimulation and hyperventilation were not carried out. There are no focal or lateralized slowing. There are no epileptiform activity is observed.    IMPRESSION: 1. This recording shows mild generalized slowing otherwise unremarkable.      Jerret Mcbane A. Gerilyn Pilgrimoonquah, M.D.  Diplomate, Biomedical engineerAmerican Board of Psychiatry and Neurology ( Neurology).

## 2013-09-24 NOTE — Progress Notes (Signed)
PROGRESS NOTE  [name] Oliver JYN:[id number] DOB: 02/01/1933 DOA: 09/19/2013 PCP: Kirstie PeriSHAH,ASHISH, MD  Summary: 80 year old woman with uncomplicated past medical history presented after a mechanical fall resulting in right hip pain. CT scan revealed right inferior and superior pelvic fractures along with sacral fracture and she was admitted for pain control and orthopedic evaluation. For pain control she was placed on PCA and seen by physical therapy  Assessment/Plan: 1. Right-sided pubic rami fractures, nondisplaced sacral fracture secondary to mechanical fall. Slowly improving, doing well physical therapy. Minimal pain. Has not required any narcotics and 24 hours. 2. Left lateral thigh pain. Likely muscular related to fall. Range of motion is somewhat limited by pain however movement of the hip and knee appears normal. Doubt skeletal injury, plan x-rays, if negative, no further evaluation suggested. 3. Urinary retention, constipation. Side effects of morphine PCA, discontinued 4/19. Has required Foley catheter reinserted. 4. Mild cognitive impairment not meeting criteria for dementia per neurology. EEG unremarkable. No imaging recommended. MRI report as below procedure from 02/2013, unremarkable.   Overall much improved with no need for narcotics in the last 24 hours. Progressing with therapy. Plan transferred to skilled nursing facility today pending the results of left leg films as above.   Continue bowel regimen  Treat UTI. Followup culture.   Voiding trial 4/21. Rediscussed catheter removal today versus tomorrow, patient desires removal tomorrow.  Transfer to skilled nursing facility today  Discussed with husband at bedside again 4/20.  Code Status: full code DVT prophylaxis: Lovenox Family Communication: none present Disposition Plan: home when improved  Brendia Sacksaniel Goodrich, MD  [facility]  Pager [phone] If 7PM-7AM, please contact night-coverage at [email or web address], password  [email or web address] 09/24/2013, 2:27 PM  LOS: 5 days   Consultants:  Orthopedics   Neurology  Procedures:  EEG: Mild generalized slowing otherwise unremarkable.   [facility]: No acute intracranial abnormality.  HPI/Subjective: "I feel better". No pain at rest. No significant pain with movement of her leg. She complains of left lateral thigh pain "in the muscle" with movement. No knee pain, no hip pain no back pain.   Objective: Filed Vitals:   09/23/13 1227 09/23/13 1435 09/23/13 2227 09/24/13 0639  BP:  148/85 148/80 122/71  Pulse:  91 78 73  Temp:  98.7 F (37.1 C) 98.4 F (36.9 C) 98.4 F (36.9 C)  TempSrc:  Oral Oral Oral  Resp: 13 13 13 16   Height:      Weight:    63 kg (138 lb 14.2 oz)  SpO2: 96% 94% 94% 96%    Intake/Output Summary (Last 24 hours) at 09/24/13 1427 Last data filed at 09/24/13 1007  Gross per 24 hour  Intake    360 ml  Output   1100 ml  Net   -740 ml     Filed Weights   09/22/13 0636 09/23/13 0500 09/24/13 0639  Weight: 61.5 kg (135 lb 9.3 oz) 61.2 kg (134 lb 14.7 oz) 63 kg (138 lb 14.2 oz)    Exam:   Afebrile, vital signs are stable. No hypoxia.  Gen. Appears calm, alert, appears much better today.  Cardiovascular regular rate and rhythm. No murmur, rub or gallop. No lower extremity edema.  Respiratory clear to auscultation bilaterally. No wheezes, rales or rhonchi. Normal respiratory effort.  Abdomen is soft, nontender, nondistended.  Musculoskeletal. Moves her right leg very well. Left leg movement somewhat limited secondary to pain in the left lateral thigh, nontender to palpation, no gross deformity  on visual inspection, no bruising. No pain in the hip or low back. Able to extend knee without difficulty.  Data Reviewed:  Repeat urinalysis now grossly positive. Urine culture pending.  Scheduled Meds: . cephALEXin  500 mg Oral Q12H  . docusate sodium  100 mg Oral BID  . enoxaparin  (LOVENOX) injection  40 mg Subcutaneous Q24H  . hydrALAZINE  10 mg Intravenous Once  . ibuprofen  400 mg Oral QID  . polyethylene glycol  17 g Oral BID  . senna  1 tablet Oral QHS  . traZODone  100 mg Oral QHS   Continuous Infusions: . sodium chloride 20 mL/hr (09/24/13 0706)    Principal Problem:   Pelvic fracture Active Problems:   Fall   Leukocytosis   Accelerated hypertension   Urinary retention   Unspecified constipation   Time spent 15 minutes

## 2013-09-24 NOTE — Progress Notes (Signed)
Patient ID: [name] Oliver, female   DOB: 12/09/1932, 80 y.o.   MRN: [id number]  [facility] Netta Fodge A. Gerilyn Pilgrimoonquah, MD     [email or web address]          [name] Mccallumorma Ates is an 80 y.o. female.   Assessment/Plan: 1. Mild cognitive impairment not meeting the criteria for dementia at this time. She appears to have some episodic confusion which is concerning for possible nonconvulsive seizures. Dementia labs will also be obtained. We'll [facility] to [facility]. No need to repeat imaging at this time.  2. Multifactorial gait impairment including osteoarthritis and history of fractures. The patient will need physical therapy.  New complaints are reported. The patient is given physical therapy mostly was transferred to the chair.    GENERAL: Pleasant female in no acute distress.  HEENT: Supple. Atraumatic normocephalic.  ABDOMEN: soft  EXTREMITIES: No edema. Limited range of motion of the hips due to pain.  BACK: Normal.  SKIN: Normal by inspection.  MENTAL STATUS: Alert and oriented. She is oriented to independent hospital in [place]. She also oriented to year month and close to the date. Speech, language and cognition are generally intact. Judgment and insight normal.  MOTOR: Normal tone, bulk and strength In the upper extremities is; no pronator drift. Legs are 4 minus/5 On the Left and 4 and the Right.  COORDINATION: Left finger to nose is normal, right finger to nose is normal, No rest tremor; no intention tremor; no postural tremor; no bradykinesia.  .  EEG shows mild slowing otherwise unremarkable. Dementia labs unremarkable.   Objective: Vital signs in last 24 hours: Temp:  [98.4 F (36.9 C)-98.7 F (37.1 C)] 98.4 F (36.9 C) (04/20 0639) Pulse Rate:  [73-91] 73 (04/20 0639) Resp:  [13-16] 16 (04/20 0639) BP: (122-148)/(71-85) 122/71 mmHg (04/20 0639) SpO2:  [94 %-96 %] 96 % (04/20 0639) Weight:  [63 kg (138 lb 14.2 oz)] 63 kg (138 lb 14.2 oz)  (04/20 0639)  Intake/Output from previous day: 04/19 0701 - 04/20 0700 In: 480 [P.O.:480] Out: 650 [Urine:650] Intake/Output this shift:   Nutritional status: General   Lab Results: Results for orders placed during the hospital encounter of 09/19/13 (from the past 48 hour(s))  URINALYSIS, ROUTINE W REFLEX MICROSCOPIC     Status: Abnormal   Collection Time    09/22/13  6:47 PM      Result Value Ref Range   Color, Urine YELLOW  YELLOW   APPearance CLEAR  CLEAR   Specific Gravity, Urine 1.020  1.005 - 1.030   pH 6.0  5.0 - 8.0   Glucose, UA NEGATIVE  NEGATIVE mg/dL   Hgb urine dipstick NEGATIVE  NEGATIVE   Bilirubin Urine NEGATIVE  NEGATIVE   Ketones, ur 15 (*) NEGATIVE mg/dL   Protein, ur NEGATIVE  NEGATIVE mg/dL   Urobilinogen, UA 0.2  0.0 - 1.0 mg/dL   Nitrite NEGATIVE  NEGATIVE   Leukocytes, UA NEGATIVE  NEGATIVE   Comment: MICROSCOPIC NOT DONE ON URINES WITH NEGATIVE PROTEIN, BLOOD, LEUKOCYTES, NITRITE, OR GLUCOSE <1000 mg/dL.  CBC WITH DIFFERENTIAL     Status: Abnormal   Collection Time    09/23/13  5:51 AM      Result Value Ref Range   WBC 13.9 (*) 4.0 - 10.5 K/uL   RBC 3.83 (*) 3.87 - 5.11 MIL/uL   Hemoglobin 12.2  12.0 - 15.0 g/dL   HCT 40.936.3  81.136.0 - 91.446.0 %   MCV 94.8  78.0 - 100.0 fL   MCH 31.9  26.0 - 34.0 pg   MCHC 33.6  30.0 - 36.0 g/dL   RDW 09.812.9  11.911.5 - 14.715.5 %   Platelets 272  150 - 400 K/uL   Neutrophils Relative % 79 (*) 43 - 77 %   Neutro Abs 11.0 (*) 1.7 - 7.7 K/uL   Lymphocytes Relative 9 (*) 12 - 46 %   Lymphs Abs 1.3  0.7 - 4.0 K/uL   Monocytes Relative 11  3 - 12 %   Monocytes Absolute 1.5 (*) 0.1 - 1.0 K/uL   Eosinophils Relative 1  0 - 5 %   Eosinophils Absolute 0.1  0.0 - 0.7 K/uL   Basophils Relative 0  0 - 1 %   Basophils Absolute 0.0  0.0 - 0.1 K/uL  CBC WITH DIFFERENTIAL     Status: Abnormal   Collection Time    09/24/13  5:17 AM      Result Value Ref Range   WBC 9.2  4.0 - 10.5 K/uL   RBC 3.50 (*) 3.87 - 5.11 MIL/uL   Hemoglobin  11.2 (*) 12.0 - 15.0 g/dL   HCT 82.933.2 (*) 56.236.0 - 13.046.0 %   MCV 94.9  78.0 - 100.0 fL   MCH 32.0  26.0 - 34.0 pg   MCHC 33.7  30.0 - 36.0 g/dL   RDW 86.512.8  78.411.5 - 69.615.5 %   Platelets 246  150 - 400 K/uL   Neutrophils Relative % 65  43 - 77 %   Neutro Abs 6.0  1.7 - 7.7 K/uL   Lymphocytes Relative 17  12 - 46 %   Lymphs Abs 1.5  0.7 - 4.0 K/uL   Monocytes Relative 14 (*) 3 - 12 %   Monocytes Absolute 1.3 (*) 0.1 - 1.0 K/uL   Eosinophils Relative 4  0 - 5 %   Eosinophils Absolute 0.3  0.0 - 0.7 K/uL   Basophils Relative 0  0 - 1 %   Basophils Absolute 0.0  0.0 - 0.1 K/uL    Lipid Panel No results found for this basename: CHOL, TRIG, HDL, CHOLHDL, VLDL, LDLCALC,  in the last 72 hours  Studies/Results: No results found.  Medications:  Scheduled Meds: . docusate sodium  100 mg Oral BID  . enoxaparin (LOVENOX) injection  40 mg Subcutaneous Q24H  . hydrALAZINE  10 mg Intravenous Once  . ibuprofen  400 mg Oral QID  . polyethylene glycol  17 g Oral BID  . senna  1 tablet Oral QHS  . traZODone  100 mg Oral QHS   Continuous Infusions: . sodium chloride 20 mL/hr (09/24/13 0706)   PRN Meds:.acetaminophen, guaiFENesin-dextromethorphan, HYDROcodone-acetaminophen, labetalol, morphine injection, ondansetron (ZOFRAN) IV, ondansetron     LOS: 5 days   Dailey Alberson A. Gerilyn Pilgrimoonquah, M.D.  Diplomate, Biomedical engineerAmerican Board of Psychiatry and Neurology ( Neurology).

## 2013-09-24 NOTE — Progress Notes (Signed)
Subjective: I feel better   Objective: Vital signs in last 24 hours: Temp:  [98.4 F (36.9 C)-98.7 F (37.1 C)] 98.4 F (36.9 C) (04/20 0639) Pulse Rate:  [73-91] 73 (04/20 0639) Resp:  [12-16] 16 (04/20 0639) BP: (122-148)/(71-85) 122/71 mmHg (04/20 0639) SpO2:  [94 %-96 %] 96 % (04/20 0639) Weight:  [63 kg (138 lb 14.2 oz)] 63 kg (138 lb 14.2 oz) (04/20 0639)  Intake/Output from previous day: 04/19 0701 - 04/20 0700 In: 480 [P.O.:480] Out: 650 [Urine:650] Intake/Output this shift:     Recent Labs  09/22/13 0622 09/23/13 0551 09/24/13 0517  HGB 11.8* 12.2 11.2*    Recent Labs  09/23/13 0551 09/24/13 0517  WBC 13.9* 9.2  RBC 3.83* 3.50*  HCT 36.3 33.2*  PLT 272 246   No results found for this basename: NA, K, CL, CO2, BUN, CREATININE, GLUCOSE, CALCIUM,  in the last 72 hours No results found for this basename: LABPT, INR,  in the last 72 hours  Neurologically intact Neurovascular intact Sensation intact distally Intact pulses distally Dorsiflexion/Plantar flexion intact  She got up with assistance and set in chair yesterday.  She did well.  She still complains of pain.  Taking oral medicines now, PCA stopped.  Has foley catheter again secondary to retention of urine.   Assessment/Plan: Await SNF bed, continue PT.   [name] Oliver 09/24/2013, 7:45 AM

## 2013-09-24 NOTE — Clinical Social Work Note (Signed)
Patient given bed offers ( [facility] and [facility]) - [facility].  Facility admissions informed and agreeable.  Santa GeneraAnne Cunningham, LCSW Clinical Social Worker [phone])

## 2013-09-24 NOTE — Progress Notes (Signed)
UR chart review completed.  

## 2013-09-24 NOTE — Clinical Social Work Note (Signed)
Patient ready for discharge today, [facility] w [facility] staff.  FL2 reviewed w RN and updated as needed.  Discharge summary faxed to facility via [facility].  Discharge packet prepared and placed w shadow chart for transport.  CSW spoke w patient, husband and daughter in room - all agreeable to [facility] when MD is ready.  [facility] to [facility], room assigned.  CSW signing off as no further SW needs identified.  Santa GeneraAnne Nephtali Docken, LCSW Clinical Social Worker [phone])

## 2013-09-24 NOTE — Progress Notes (Signed)
Report called to [facility]. 

## 2013-09-24 NOTE — Discharge Summary (Signed)
Physician Discharge Summary  [name] Oliver ZOX:[id number] DOB: 03/27/1933 DOA: 09/19/2013  PCP: Kirstie PeriSHAH,ASHISH, MD  Admit date: 09/19/2013 Discharge date: 09/24/2013  Recommendations for Outpatient Follow-up:  1. Right-sided pubic rami fractures, nondisplaced sacral fracture secondary to mechanical fall  2. WBAT, x-ray pelvis in 2 weeks and f/u with Dr. Hilda LiasKeeling in 2 weeks. 3. UTI. Culture pending, followup results. 4. Urinary retention. Suggest removal of urinary catheter 4/21 and pursue voiding trial. 5. Constipation. Bowel regimen suggested.   Follow-up Information   [facility].   Specialty:  Internal Medicine   Contact information:   [address]  [place] [address] [phone]       Follow up with Darreld McleanKEELING,WAYNE, MD. Schedule an appointment as soon as possible for a visit in 2 weeks. (needs x-ray of pelvis prior to follow-up)    Specialty:  Orthopedic Surgery   Contact information:   [address] [place] [address] [phone]      Discharge Diagnoses:  1. Right-sided pubic rami fractures, nondisplaced sacral fracture secondary to mechanical fall 2. Urinary retention 3. UTI 4. Constipation 5. Mild cognitive impairment not meeting criteria for dementia  Discharge Condition: Improved [facility]  Diet recommendation: Regular diet  Filed Weights   09/22/13 0636 09/23/13 0500 09/24/13 0639  Weight: 61.5 kg (135 lb 9.3 oz) 61.2 kg (134 lb 14.7 oz) 63 kg (138 lb 14.2 oz)    History of present illness:  80 year old woman with uncomplicated past medical history presented after a mechanical fall resulting in right hip pain. CT scan revealed right inferior and superior pelvic fractures along with sacral fracture and she was admitted for pain control and orthopedic evaluation.   Hospital Course:  For pain control she was placed on PCA and seen by physical therapy, she had very gradual improvement, was  successfully weaned off PCA and has progressed with therapy. She developed urinary retention requiring a catheter insertion, and then failed voiding trial and in/out catheterization, requiring reinsertion of foley catheter; urinary retention likely secondary to narcotics. She also reported some left lateral thigh pain.  1. Right-sided pubic rami fractures, nondisplaced sacral fracture secondary to mechanical fall. Slowly improving, doing well physical therapy. Minimal pain. Has not required any narcotics and 24 hours. 2. Left lateral thigh pain. Likely muscular related to fall. Range of motion is somewhat limited by pain however movement of the hip and knee appears normal. Doubt skeletal injury, plan x-rays, if negative, no further evaluation suggested. 3. Urinary retention, constipation. Side effects of morphine PCA, discontinued 4/19. Has required Foley catheter reinserted. 4. Mild cognitive impairment not meeting criteria for dementia per neurology. EEG unremarkable. No imaging recommended. MRI report as below procedure from 02/2013, unremarkable. Overall much improved with no need for narcotics in the last 24 hours. Progressing with therapy. Plan transferred to skilled nursing facility today pending the results of left leg films as above.  Continue bowel regimen  Treat UTI. Followup culture.  Voiding trial 4/21. Rediscussed catheter removal today versus tomorrow, patient desires removal tomorrow.   Consultants:  Orthopedics  Neurology Procedures:  EEG: Mild generalized slowing otherwise unremarkable.  [facility]/2013: No acute intracranial abnormality.  Discharge Instructions     Medication List         cephALEXin 500 MG capsule  Commonly known as:  KEFLEX  Take 1 capsule (500 mg total) by mouth every 12 (twelve) hours. Last dose 4/22.  DSS 100 MG Caps  Take 100 mg by mouth 2 (two) times daily.     famotidine 20 MG tablet  Commonly  known as:  PEPCID  Take 1 tablet (20 mg total) by mouth 2 (two) times daily. Take while on scheduled ibuprofen.     HYDROcodone-acetaminophen 5-325 MG per tablet  Commonly known as:  NORCO/VICODIN  Take 1 tablet by mouth every 6 (six) hours as needed for moderate pain.     ibuprofen 400 MG tablet  Commonly known as:  ADVIL,MOTRIN  Take 1 tablet (400 mg total) by mouth 4 (four) times daily. For 3 days, then use every 6 hours as needed for mild pain.     polyethylene glycol packet  Commonly known as:  MIRALAX / GLYCOLAX  Take 17 g by mouth 2 (two) times daily.     senna 8.6 MG Tabs tablet  Commonly known as:  SENOKOT  Take 1 tablet (8.6 mg total) by mouth at bedtime.     traZODone 100 MG tablet  Commonly known as:  DESYREL  Take 100 mg by mouth at bedtime.       No Known Allergies The results of significant diagnostics from this hospitalization (including imaging, microbiology, ancillary and laboratory) are listed below for reference.    Significant Diagnostic Studies: Dg Hip Complete Right  09/19/2013   CLINICAL DATA:  Right hip pain status post trauma  EXAM: RIGHT HIP - COMPLETE 2+ VIEW  COMPARISON:  None.  FINDINGS: Total right hip arthroplasty is appreciated. Hardware intact without evidence of loosening or failure. The native osseous structures demonstrate no evidence of acute fracture or dislocation. Degenerative changes appreciated within the lower lumbar spine.  IMPRESSION: No evidence of hardware no osseous abnormalities.   Electronically Signed   By: Salome HolmesHector  Cooper M.D.   On: 09/19/2013 15:39   Ct Hip Right Wo Contrast  09/19/2013   CLINICAL DATA:  Hip pain with limited angulation after falling today. History of right total hip arthroplasty.  EXAM: CT OF THE RIGHT HIP WITHOUT CONTRAST  TECHNIQUE: Multidetector CT imaging was performed according to the standard protocol. Multiplanar CT image reconstructions were also generated.  COMPARISON:  Pelvic and right hip radiographs  same date.  FINDINGS: Examination is limited to the right hip and proximal femur. The entire pelvis is not imaged. There is moderate artifact related to the right total hip arthroplasty.  There are mildly displaced acute fractures of the right inferior pubic ramus. The superior pubic ramus is largely obscured by artifact, but likely has a nondisplaced fracture. The inferior aspect of the right sacroiliac joint is visualized and is intact. However, there is a nondisplaced fracture of the right sacrum, best seen on the coronal images.  The right hip prosthesis appears intact without evidence of dislocation. Thick heterotopic ossification is noted lateral to acetabulum. This does not appear acute as correlated with the radiographs. No hardware loosening is apparent. There is no evidence of proximal femur fracture. Of note, the distal end of the prosthesis is not imaged on this CT. It was imaged on the earlier radiographs.  There is a small amount of soft tissue hemorrhage surrounding the pubic rami fractures and tracking superiorly along the right iliac vessels. No large hematoma is demonstrated.  IMPRESSION: 1. Mildly displaced fractures of the right inferior pubic rami. Although not well seen, there is a probable coexisting superior pubic ramus fracture. 2. Nondisplaced fracture of the right sacrum. The visualized right sacroiliac joint appears intact. 3. No evidence  of dislocation of the right hip prosthesis. No periprosthetic fracture identified- the distal end of the prosthesis is not imaged but appeared intact on earlier radiographs. 4. Small amount of acute hemorrhage adjacent to the fractures.   Electronically Signed   By: Roxy Horseman M.D.   On: 09/19/2013 17:00   Dg Chest Port 1 View  09/19/2013   CLINICAL DATA:  Cough.  EXAM: PORTABLE CHEST - 1 VIEW  COMPARISON:  03/24/2012.  FINDINGS: Cardiac silhouette, mediastinal and hilar contours are within normal limits and stable. There is mild tortuosity and  ectasia of the thoracic aorta. The lungs are clear of acute process. Mild chronic bronchitic changes. No focal infiltrates or pleural effusion. The bony thorax is intact.  IMPRESSION: No acute cardiopulmonary findings.  Chronic bronchitic changes.   Electronically Signed   By: Loralie Champagne M.D.   On: 09/19/2013 18:44    Labs: Basic Metabolic Panel:  Recent Labs Lab 09/19/13 1725 09/20/13 0516 09/21/13 0520  NA 143 143 138  K 3.9 4.0 4.0  CL 104 105 100  CO2 26 27 27   GLUCOSE 137* 140* 98  BUN 13 12 14   CREATININE 0.81 0.74 0.76  CALCIUM 9.7 9.1 9.2   CBC:  Recent Labs Lab 09/19/13 1725 09/20/13 0516 09/21/13 0520 09/22/13 0622 09/23/13 0551 09/24/13 0517  WBC 21.4* 14.3* 10.2 9.4 13.9* 9.2  NEUTROABS 18.7*  --   --  6.2 11.0* 6.0  HGB 13.3 12.4 11.6* 11.8* 12.2 11.2*  HCT 39.9 37.3 34.5* 35.8* 36.3 33.2*  MCV 95.0 94.9 94.8 96.2 94.8 94.9  PLT 324 277 231 243 272 246    Principal Problem:   Pelvic fracture Active Problems:   Fall   Leukocytosis   Accelerated hypertension   Urinary retention   Unspecified constipation   Time coordinating discharge: 35 minutes  Signed:  Brendia Sacks, MD [facility] 09/24/2013, 2:40 PM

## 2013-09-24 NOTE — Clinical Social Work Placement (Signed)
    Clinical Social Work Department CLINICAL SOCIAL WORK PLACEMENT NOTE 09/24/2013  Patient:  [name] Oliver,[name] Oliver  Account Number:  [id number] Admit date:  09/19/2013  Clinical Social Worker:  Irving BurtonEMILY SZPYRKA, CLINICAL SOCIAL WORKER  Date/time:  09/21/2013 11:23 AM  Clinical Social Work is seeking post-discharge placement for this patient at the following level of care:   SKILLED NURSING   (*CSW will update this form in Epic as items are completed)   09/21/2013  Patient/family provided with [facility] Department of Clinical Social Work's list of facilities offering this level of care within the geographic area requested by the patient (or if unable, by the patient's family).  09/21/2013  Patient/family informed of their freedom to choose among providers that offer the needed level of care, that participate in Medicare, Medicaid or managed care program needed by the patient, have an available bed and are willing to accept the patient.  09/21/2013  Patient/family informed of [facility]' ownership interest in [facility], as well as of the fact that they are under no obligation to receive care at this facility.  PASARR submitted to [employer] on  PASARR number received from [employer] on   FL2 transmitted to all facilities in geographic area requested by pt/family on  09/21/2013 FL2 transmitted to all facilities within larger geographic area on 09/21/2013  Patient informed that his/her managed care company has contracts with or will negotiate with  certain facilities, including the following:     Patient/family informed of bed offers received:  09/24/2013 [facility] Physician recommends and patient chooses bed at    [facility] on  09/24/2013 [facility]  The following physician request were entered in Epic:   Additional Comments: Pt has existing PASARR  Santa GeneraAnne Cunningham,  LCSW Clinical Social Worker [phone])

## 2013-09-26 ENCOUNTER — Non-Acute Institutional Stay (SKILLED_NURSING_FACILITY): Payer: Medicare Other | Admitting: Internal Medicine

## 2013-09-26 DIAGNOSIS — N390 Urinary tract infection, site not specified: Secondary | ICD-10-CM

## 2013-09-26 DIAGNOSIS — R339 Retention of urine, unspecified: Secondary | ICD-10-CM | POA: Diagnosis not present

## 2013-09-26 DIAGNOSIS — R269 Unspecified abnormalities of gait and mobility: Secondary | ICD-10-CM | POA: Diagnosis not present

## 2013-09-26 DIAGNOSIS — G20 Parkinson's disease: Secondary | ICD-10-CM | POA: Diagnosis not present

## 2013-09-26 LAB — URINE CULTURE: Colony Count: >=100000

## 2013-09-26 NOTE — Progress Notes (Signed)
Patient ID: [name] Oliver, female   DOB: 04/10/1933, 80 y.o.   MRN: [id number] Facility; [facility] Chief complaint admission to SNF close to stay at [place]/15 through 4/1 History; this is a patient who was at church and was assisting another person getting something off a shelf with the other person standing on a stool. Apparently they fell backwards. The patient suffered fractures of the pubic rami and a nondisplaced sacral fracture. She was admitted to hospital because of this [to see CT scan report listed below]. She does not have a history of recurrent falls although she did have a fall without injury in May of 2014 and suffered a right hip fracture 10 years ago. She does not use an ambulatory assist device  Her daughter is present describes a worrisome constellation of problems that have developed over the last year. Firstly she notes mild cognitive issues that started roughly a year ago. She also describes worsening gait problems with a narrow base shuffling gait with the knees in a valgus deformity brackets the daughter demonstrates this]. There've also been issues with nocturnal insomnia with the patient pacing restlessly with a "glazed look on her face" area during these times she does not appear to vocalize anything in particular other than a wide variety of complaints. She is awake at these times the daughter has seen her. She also seems to have made some inappropriate decisions for instance left church a while ago to go outside for some fresh air and then couldn't seem to find her way back in and actually walked home. Her record lists mild cognitive impairment not meeting criteria for dementia.  Finally during this hospitalization she developed urinary retention. I am not clear whether she actually failed a voiding trial or not. Patient does not scribed premorbid voiding difficulties other than what sounds like some mild stress incontinence. Urine culture did show Escherichia coli she is on  Keflex  Past medical history/problem list #1 right-sided pubic rami fractures nondisplaced sacral flexion #2 according to the patient her daughter she has been diagnosed with osteoporosis. Follows with a Dr. Clelia CroftShaw .  #3 right hip fracture 10 years ago #4 progressive gait difficulty over the last year as graphically described by her daughter and demonstrated #5 progressive cognitive deficits over the last year #6 constipation  Medications Colace 100 twice a day Ibuprofen 400 mg 4 times a day and when necessary Keflex 500 mg every 12 MiraLax 17 g daily Norco 5/325 one tablet every 6 hours when necessary Pepcid 20 mg daily twice a day Senna 8.6 at bedtime Trazodone 100 mg at bedtime this was started due to the insomnia in January with apparently some effect  Socially; the patient lives with her husband. Daughter is present lives in [place]. Apparently independent with ADLs and IADLs not using an ambulatory assist device History   Social History  . Marital Status: Married    Spouse Name: N/A    Number of Children: N/A  . Years of Education: N/A   Occupational History  . Not on file.   Social History Main Topics  . Smoking status: Never Smoker   . Smokeless tobacco: Never Used  . Alcohol Use: No  . Drug Use: No  . Sexual Activity: Not on file   Other Topics Concern  . Not on file   Social History Narrative  . No narrative on file    Family history; sister with Parkinson's disease  Review of systems Respiratory has a long-standing chronic cough which is  nonproductive her it apparently has had an extensive workup for this and has seen a pulmonologist Dr. Orson AloeHenderson however he is apparently left the state Musculoskeletal actually complains of most of her pain in the back and left leg not on the right side where most of her pelvic fractures appear to be GU no voiding history difficulties except as for some occasional "leakage" quotes now with a Foley catheter in GI describes  constipation Neurologic progressive gait difficulties over the last year also cognitive issues [please see history of present illness]  Physical examination Gen. pleasant woman in no distress. 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 Jaw deviates to the right on opening otherwise cranial nerves are normal. There is no pronator drift in the upper extremities. There is increased tone in both her upper extremities however I am not convinced this is cogwheeling. Mild right Hoffman's reflex. Increased tone in both legs although this may be a pain issue. Reflexes are 3+ diffusely I could not position her enough to do her ankle jerks. Her left plantar is extensor right is flexor. Gait; not tested Mental status no overt abnormalities. I would like to do a Folstein Mini-Mental status on her perhaps further testing myself when I have some time to do this  Impression/plan #1 pelvic fractures as described below. There is pain here especially in the left leg. Whether this is radicular or there is an undiagnosed problem I am uncertain. #2 urinary retention in the setting of an Escherichia coli UTI white count was 21,000 when she entered the hospital this came down nicely. I will make plans to pull the Foley catheter tomorrow she is completing her antibiotics today. #3 progressive gait difficulties over the last year #4 parkinsonism. I am choosing this word carefully I don't mean to infer I am certain she has Parkinson's disease. I think there is fairly extensive differential diagnosis to this which might include  vascular parkinsonism, a Parkinson's plus  syndrome, Parkinson's disease and/or a cortically based dementia with parkinsonism i.e. Lewy body disease. She did have an MRI of the brain done in [facility] I'll see if I can get this report although it is listed as "negative" significant additional issues will need to be excluded ie significant microvascular disease etc. #5 listed as having accelerated hypertension in her problem list although she is not on anything currently for her hypertension #6 insomnia described at home. This almost sounds slight the part of an overall complex or problems that this patient has been experiencing in the last year. I note a unrevealing EEG in the hospital  I will followup on her CBC. Right orders for catheter removal tomorrow. We'll check a 25-hydroxy vitamin D level, B12 level, TSH and VDRL if not done. She apparently has underlying osteoporosis although she's not on anything for this. This will need to be taken care of before she leaves the facility.  I'm expecting her rehabilitation will be difficult. Pelvic fractures and resultant pain or never easy issues to overcome plus she apparently has a significant gait problem. I will review her Folstein Mini-Mental Status perhaps additional cognitive testinh. I note she did see neurology in the hospital and some of my  findings weren't included in the note although she was on a morphine drip at the time, I would like to see how she does in rehabilitation see if she'll get a little more functional before seeking reevaluation

## 2013-09-27 ENCOUNTER — Encounter: Payer: Self-pay | Admitting: Internal Medicine

## 2013-09-27 ENCOUNTER — Non-Acute Institutional Stay (SKILLED_NURSING_FACILITY): Payer: Medicare Other | Admitting: Internal Medicine

## 2013-09-27 DIAGNOSIS — N390 Urinary tract infection, site not specified: Secondary | ICD-10-CM | POA: Diagnosis not present

## 2013-09-27 DIAGNOSIS — S329XXA Fracture of unspecified parts of lumbosacral spine and pelvis, initial encounter for closed fracture: Secondary | ICD-10-CM | POA: Diagnosis not present

## 2013-09-27 NOTE — Progress Notes (Signed)
Patient ID: [name] Oliver, female   DOB: 03/13/1933, 80 y.o.   MRN: [id number]   this is an acute visit.  Level of care skills.  [facility] .   Chief complaint-acute visit secondary to UTI.  History of present illness.  Patient is a pleasant 80 year old female here for rehabilitation after sustaining a right sided pubic rami fracture and nondisplaced sacral fracture secondary to a fall.  Apparently this showed some slow improvement in the hospital but she will need continued rehabilitation in skilled nursing which is why she is here.  Apparently she also experienced urinary retention in the hospital and required a Foley catheter this was thought possibly side effect of the morphine she was receiving-this was discontinued on April 19 dasher Foley catheter was removed yesterday and apparently she is voiding.  Apparently there were suspicions of a UTI as well and a culture was obtained we have obtain those results and it does show greater than 100,000 colonies of Escherichia coli-she was empirically started on Keflex in the hospital it appears about 3 days ago.  Family medical social history as been reviewed per discharge summary on 09/24/2013.  Medications have been reviewed per MAR.  Review of systems.  General no complaints of fever or chills.  Respiratory no complaints of shortness of breath or cough.  Cardiac no chest pain.  GI does not complaining of any nausea or vomiting currently had constipation but had a large bowel movement earlier today.  GU again a history of urinary retention apparently she is voiding this afternoon-she denies any dysuria.  Muscle skeletal is complaining of some pelvic sacral discomfort but she says her current pain medication ibuprofen is effective.  Physical exam.  Temperature 98.1 pulse 74 respirations 20 blood pressure 138/80.  In general this is a somewhat frail elderly female in no distress lying comfortably in bed.  Her skin is warm  and dry.  Chest is clear to auscultation no labored breathing.  Heart is regular rate and rhythm with minimal lower extremity edema.  Abdomen is soft nontender with positive bowel sounds.  She does not have any suprapubic distention but does appear to have some mild tenderness to palpation here.  I did not note any CV tenderness.  Musculoskeletal-has some tenderness to palpation of the sacral and pelvic areas bilaterally I do not see any deformity however redness or edema or sign of an acute process  Psych she is oriented to self month and day of week although it did take her a while to come up with the month of April-.  Labs.  Urine culture as noted above.  09/21/2013.  Sodium 138 potassium 4 BUN 14 creatinine 0.76.  09/24/2013.  WBC 9.2 hemoglobin 11.2 platelets 246.  Assessment and plan.  #1-UTI-she is on Keflex will extend this appears this was started on April 20 will extend this for a total 7 day course-also add probiotic twice a day until completion of the antibiotic.  Apparently Foley catheter is been removed and she is urinating although this will have to be monitored  #2-history of pelvic and sacral fractures-this appears to be stable again ibuprofen apparently is effective for her pain.   .  [id number].  [id number]  The culture show sensitivity to first generation cephalosporins that she does not complaining of dysuria today or back pain she is afebrile appears to be relatively asymptomatic here-of note the Escherichia coli appeared to be pansensitive except to sulfa

## 2013-10-03 ENCOUNTER — Non-Acute Institutional Stay (SKILLED_NURSING_FACILITY): Payer: Medicare Other | Admitting: Internal Medicine

## 2013-10-03 DIAGNOSIS — R269 Unspecified abnormalities of gait and mobility: Secondary | ICD-10-CM

## 2013-10-03 DIAGNOSIS — G20 Parkinson's disease: Secondary | ICD-10-CM | POA: Diagnosis not present

## 2013-10-08 NOTE — Progress Notes (Addendum)
Patient ID: [name] Oliver, female   DOB: 02/11/1933, 80 y.o.   MRN: [id number]                  PROGRESS NOTE  DATE:  10/03/2013     FACILITY: [facility]     LEVEL OF CARE:   SNF   Acute Visit   HISTORY OF PRESENT ILLNESS:   This is a patient whom I admitted to the facility earlier this month.  She had fallen and fractured her pelvis.  Her daughter described a worrisome constellation of problems to me at the time I first saw her including cognitive issues, worsening gait problems.    I thought she had cognitive issues when I first saw her.  A Folstein mini-mental status done in the facility scored 20/30.  She has made progress in physical therapy.  However, she has had a difficult time with extreme episodes of anxiety, usually later in the afternoon or early evening.   Apparently, the daughter can "see this coming on".  She gets increasingly anxious, very fearful.  She  apparently starts talking about herself in the third person.  She was seen by Psychiatry yesterday and put on Buspar, which seems reasonable.  She also has a long history of insomnia which the patient states is due to anxiety.      PHYSICAL EXAMINATION:   GENERAL APPEARANCE:  The patient is alert, awake, and cognizant.  She speaks in a soft monotone.   NEUROLOGICAL:    TONE:  There is increased tone in both her lower extremities and upper extremities, although this is not classic cogwheeling.   DEEP TENDON REFLEXES:  Reflexes are symmetrically brisk.   BALANCE/GAIT:  Narrow-based, very slow and hesitant.  She appears to drag the left leg.  All of this happened before her pelvic fracture.    ASSESSMENT/PLAN:  Parkinsonism.  I want to see about the lab work I ordered earlier this month.  I would also like to see the [facility] MRI from September 2014.  I may be at the point where I would like to challenge her with Sinemet to see if there is any response over the next month.    Dementia.   She scored 20/30 on the  Folstein.  I would like to repeat this.  The daughter has noted cognitive issues over the last year including wandering outside of the home, walking home from church, etc.

## 2013-10-09 ENCOUNTER — Ambulatory Visit (HOSPITAL_COMMUNITY): Payer: Medicare Other | Attending: Orthopaedic Surgery

## 2013-10-09 DIAGNOSIS — S329XXA Fracture of unspecified parts of lumbosacral spine and pelvis, initial encounter for closed fracture: Secondary | ICD-10-CM | POA: Diagnosis not present

## 2013-10-09 DIAGNOSIS — X58XXXA Exposure to other specified factors, initial encounter: Secondary | ICD-10-CM | POA: Insufficient documentation

## 2013-10-09 DIAGNOSIS — S3282XA Multiple fractures of pelvis without disruption of pelvic ring, initial encounter for closed fracture: Secondary | ICD-10-CM | POA: Insufficient documentation

## 2013-10-09 DIAGNOSIS — Z96649 Presence of unspecified artificial hip joint: Secondary | ICD-10-CM | POA: Insufficient documentation

## 2013-10-15 ENCOUNTER — Non-Acute Institutional Stay (SKILLED_NURSING_FACILITY): Payer: Medicare Other | Admitting: Internal Medicine

## 2013-10-15 DIAGNOSIS — G20 Parkinson's disease: Secondary | ICD-10-CM | POA: Diagnosis not present

## 2013-10-15 DIAGNOSIS — R269 Unspecified abnormalities of gait and mobility: Secondary | ICD-10-CM

## 2013-10-15 DIAGNOSIS — F0280 Dementia in other diseases classified elsewhere without behavioral disturbance: Secondary | ICD-10-CM | POA: Diagnosis not present

## 2013-10-15 NOTE — Progress Notes (Signed)
Patient ID: Adriana Mccallumorma Deguia, female   DOB: 12/11/1932, 80 y.o.   MRN: [id number]                  PROGRESS NOTE  DATE:  10/03/2013     FACILITY: [facility]     LEVEL OF CARE:   SNF   Acute Visit   HISTORY OF PRESENT ILLNESS:   This is a patient whom I admitted to the facility earlier this month.  She had fallen and fractured her pelvis.  Her daughter described a worrisome constellation of problems to me at the time I first saw her including cognitive issues, worsening gait problems.    I thought she had cognitive issues when I first saw her.  A Folstein mini-mental status done in the facility scored 20/30. I have repeated this myself at 22/30. She has a very poor performance on the clock test  She has made progress in physical therapy.  However, she has had a difficult time with extreme episodes of anxiety, usually later in the afternoon or early evening.   Apparently, the daughter can "see this coming on".  She gets increasingly anxious, very fearful.  She  apparently starts talking about herself in the third person.  She was seen by Psychiatry yesterday and put on Buspar, which seems reasonable.  She also has a long history of insomnia which the patient states is due to anxiety.   Unfortunately she was started on Sinemet 25/103 times daily, add actually only wanted that to be have this before breakfast. Family has reported increasing confusion  Review of systems Respiratory; no cough no sputum Cardiac no chest pain Musculoskeletal; she is complaining of low back pain although her original fractures were in her pelvis with right sided pubic rami and a nondisplaced sacral fracture. GU; she has not had any dysuria or voiding difficulties. We have been able to remove her Foley catheter     PHYSICAL EXAMINATION:   GENERAL APPEARANCE:  The patient is alert, awake, and cognizant.  She speaks in a soft monotone.   NEUROLOGICAL:    TONE:  There is improvement in her town in both the arms  and legs  DEEP TENDON REFLEXES:  Reflexes are symmetrically brisk.   BALANCE/GAIT:  Narrow-based, very slow and hesitant.  She appears to drag the left leg.  All of this happened before her pelvic fracture.    ASSESSMENT/PLAN:  Parkinsonism/dementia complex. She has significant cognitive issues however her short-term memory actually is quite good by testing. Nevertheless this would fit with dementia. I had wanted to give this lady a trial of Sinemet unfortunately it got started it 3 times a day instead of just a.c. Breakfast. I'll reduce the dose although at the bedside today she seems somewhat better with less tone in both her arms and her legs. I don't see any major issues with her cognitive testing related to the Sinemet

## 2013-10-17 ENCOUNTER — Non-Acute Institutional Stay (SKILLED_NURSING_FACILITY): Payer: Medicare Other | Admitting: Internal Medicine

## 2013-10-17 DIAGNOSIS — R269 Unspecified abnormalities of gait and mobility: Secondary | ICD-10-CM

## 2013-10-17 DIAGNOSIS — G20 Parkinson's disease: Secondary | ICD-10-CM

## 2013-10-17 DIAGNOSIS — S329XXA Fracture of unspecified parts of lumbosacral spine and pelvis, initial encounter for closed fracture: Secondary | ICD-10-CM | POA: Diagnosis not present

## 2013-10-17 DIAGNOSIS — F0280 Dementia in other diseases classified elsewhere without behavioral disturbance: Secondary | ICD-10-CM | POA: Diagnosis not present

## 2013-10-20 ENCOUNTER — Inpatient Hospital Stay (HOSPITAL_COMMUNITY): Payer: Medicare Other | Attending: Internal Medicine

## 2013-10-20 DIAGNOSIS — S32509A Unspecified fracture of unspecified pubis, initial encounter for closed fracture: Secondary | ICD-10-CM | POA: Diagnosis not present

## 2013-10-22 NOTE — Progress Notes (Addendum)
Patient ID: [name] Oliver, female   DOB: 05/06/1933, 80 y.o.   MRN: [id number]                   PROGRESS NOTE  DATE:  10/17/2013    FACILITY: [facility]    LEVEL OF CARE:   SNF   Acute Visit   CHIEF COMPLAINT:  Follow up confusion, low back pain.    HISTORY OF PRESENT ILLNESS:  [name] Oliver is a lady who came to us after a fall.  She had right inferior and probably superior pubic rami fractures.  She also had a nondisplaced fracture of the right sacrum.  The patient is complaining of a lot of pain, it appears over the right upper buttock region.  I am assuming that this is the right sacrum fracture.    Her daughter also gives a history of progressive cognitive decline that I have documented in my previous notes.  She also appears to drag the left leg over the last several months.  I had really wanted to see, once her pain improved, whether her mobility status would justify further imaging studies such as an MRI of her lower back.  She does have a history of a right hip prosthesis that was well aligned.  There was no periprosthetic fracture on the right.    Finally, I thought she had some elements of parkinsonism.  She also has coexistent dementia.  She has been tested twice here with a Folstein mini-mental status, scoring 20 [facility] by [facility] and 22 by myself.  I did initiate Sinemet therapy on her just to see if this would help with her mobility.  Unfortunately, this was initiated at a higher dose than I would have liked.  I reduced this to once a day two days ago.  I do not think this has anything to do with her confusion.  She has background dementia.  PHYSICAL EXAMINATION:   GENERAL APPEARANCE:  The patient is not in any distress.  However, she continues to talk about pain in her low back.   CHEST/RESPIRATORY:  Clear air entry bilaterally.   CARDIOVASCULAR:  CARDIAC:   Heart sounds are normal.   CIRCULATION:   EDEMA/VARICOSITIES:  Extremities:  No evidence of a DVT.     NEUROLOGICAL:    SENSATION/STRENGTH:  She is able to lift both her legs.   DEEP TENDON REFLEXES:  Reflexes are maintained at the knees.  Both toes are downgoing.   TONE:  She appears to have less of an increased tone than I initially described.    ASSESSMENT/PLAN:  Pelvic fractures in the inferior and superior pubic rami on the right and the right sacrum.  This is a good explanation for the pain she is describing.  Unfortunately, this is going to take time to resolve.  At some point, she would need bone density testing.  However, I do not think that needs to be now.   Parkinson's dementia complex.  She has definite dementia.  This is not mild cognitive impairment.  I have started her on once a day Sinemet and I would like to see if this improves her gait at all, although I would not expect the dragging left leg is due to this.    Gait problems, even prior to this fall.  Once again, I have tried to wait for her pain to improve before proceeding with further imaging tests, although this may not be possible.    Borderline low B12 level.  I have ordered a methylmalonic acid level.  I also have a pending 25-hydroxy vitamin D level.

## 2013-10-24 ENCOUNTER — Non-Acute Institutional Stay (SKILLED_NURSING_FACILITY): Payer: Medicare Other | Admitting: Internal Medicine

## 2013-10-24 DIAGNOSIS — F0280 Dementia in other diseases classified elsewhere without behavioral disturbance: Secondary | ICD-10-CM | POA: Diagnosis not present

## 2013-10-24 DIAGNOSIS — G20 Parkinson's disease: Secondary | ICD-10-CM | POA: Diagnosis not present

## 2013-10-24 DIAGNOSIS — S329XXA Fracture of unspecified parts of lumbosacral spine and pelvis, initial encounter for closed fracture: Secondary | ICD-10-CM

## 2013-10-24 DIAGNOSIS — R269 Unspecified abnormalities of gait and mobility: Secondary | ICD-10-CM

## 2013-10-26 ENCOUNTER — Other Ambulatory Visit: Payer: Self-pay | Admitting: *Deleted

## 2013-10-26 MED ORDER — HYDROCODONE-ACETAMINOPHEN 5-325 MG PO TABS: ORAL_TABLET | ORAL | Status: DC

## 2013-10-26 MED ORDER — HYDROCODONE-ACETAMINOPHEN 5-325 MG PO TABS
ORAL_TABLET | ORAL | Status: DC
Start: 2013-10-26 — End: 2014-05-10

## 2013-10-26 NOTE — Telephone Encounter (Signed)
[facility] 

## 2013-10-26 NOTE — Progress Notes (Addendum)
Patient ID: [name] Oliver, female   DOB: 04-Dec-1932, 80 y.o.   MRN: [id number]                  PROGRESS NOTE  DATE:  10/24/2013    FACILITY: [facility]    LEVEL OF CARE:   SNF   Acute Visit   CHIEF COMPLAINT:  Review of gait, parkinsonism.     HISTORY OF PRESENT ILLNESS:  This is a patient whom we admitted after a fall.  She had right inferior, right superior pubic rami fractures, also a nondisplaced fracture of the right sacrum.    Her daughter described a year's worth of cognitive decline and also an abnormality in her gait, especially what she describes as circumduction of the left leg.  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    With regards to the cognition, she has been tested by the social worker here at 20/30 and by myself at 22/30 on the Folstein mini-mental status exam.  She has a very abnormal clock test.  Short- and long-term memory is the most intact area.  She has negligible arithmetic skills and negligible visual-spatial skills.  Her Overall score on the FMMS was 22/30.     PHYSICAL EXAMINATION:   GENERAL APPEARANCE:  The patient appears more mobile.   NEUROLOGICAL:    TONE:  There is no increase in tone. This is quite an improvement from when she first came in.   BALANCE/GAIT:  She is able to bring herself to a standing position with minimal assistance.  Again, most of the problem here seems with the left leg.  She is walking on her tip-toes on the left, although overall her gait is narrow-based and really reminiscent of Parkinson's type gait presentation.    ASSESSMENT/PLAN:  Parkinson's dementia complex.  This has improved with the Sinemet and I am going to gently increase the dose.  However, the daughter describes her as being much more confused last week.  I did retest her.  I saw no evidence of this.  Of course, she has had the proverbial urine culture done that shows Enterococcus which is penicillin sensitive.  She has been started on  Amoxil, although I am doubtful that this has anything to do with it.    Gait changes as described above.  I really do not know how to explain this.  Her daughter describes a circumducting gait on the left.  She certainly does not have that now, although she appears to walk on her tip-toes on the left.      Borderline low B12 levels.  I still do not have the methylmalonic acid and 25-hydroxy vitamin D levels that I ordered 10 days ago.  I will have the staff track this down.    At some point, I think it is going to be appropriate to treat this woman with an Exelon patch, although I have not initiated this now.            ADDENDUM:   I finally received the MRI from September 2011 from [facility].  This was reasonably normal minimal to mild age-nonspecific white matter signal changes.

## 2013-10-28 DIAGNOSIS — F0280 Dementia in other diseases classified elsewhere without behavioral disturbance: Secondary | ICD-10-CM | POA: Insufficient documentation

## 2013-10-28 DIAGNOSIS — G20 Parkinson's disease: Principal | ICD-10-CM

## 2013-11-02 ENCOUNTER — Other Ambulatory Visit: Payer: Self-pay | Admitting: *Deleted

## 2013-11-02 DIAGNOSIS — F411 Generalized anxiety disorder: Secondary | ICD-10-CM | POA: Diagnosis not present

## 2013-11-02 DIAGNOSIS — G4700 Insomnia, unspecified: Secondary | ICD-10-CM | POA: Diagnosis not present

## 2013-11-02 DIAGNOSIS — F0390 Unspecified dementia without behavioral disturbance: Secondary | ICD-10-CM | POA: Diagnosis not present

## 2013-11-02 MED ORDER — CLONAZEPAM 0.5 MG PO TABS: ORAL_TABLET | ORAL | Status: DC

## 2013-11-02 NOTE — Telephone Encounter (Signed)
[facility] 

## 2013-11-05 ENCOUNTER — Non-Acute Institutional Stay (SKILLED_NURSING_FACILITY): Payer: Medicare Other | Admitting: Internal Medicine

## 2013-11-05 DIAGNOSIS — R339 Retention of urine, unspecified: Secondary | ICD-10-CM | POA: Diagnosis not present

## 2013-11-05 DIAGNOSIS — F329 Major depressive disorder, single episode, unspecified: Secondary | ICD-10-CM | POA: Diagnosis not present

## 2013-11-05 DIAGNOSIS — F411 Generalized anxiety disorder: Secondary | ICD-10-CM | POA: Diagnosis not present

## 2013-11-05 DIAGNOSIS — K5900 Constipation, unspecified: Secondary | ICD-10-CM | POA: Diagnosis not present

## 2013-11-05 DIAGNOSIS — Z87440 Personal history of urinary (tract) infections: Secondary | ICD-10-CM | POA: Diagnosis not present

## 2013-11-05 DIAGNOSIS — R262 Difficulty in walking, not elsewhere classified: Secondary | ICD-10-CM | POA: Diagnosis not present

## 2013-11-05 DIAGNOSIS — M6281 Muscle weakness (generalized): Secondary | ICD-10-CM | POA: Diagnosis not present

## 2013-11-05 DIAGNOSIS — R4182 Altered mental status, unspecified: Secondary | ICD-10-CM | POA: Diagnosis not present

## 2013-11-05 DIAGNOSIS — G20 Parkinson's disease: Secondary | ICD-10-CM | POA: Diagnosis not present

## 2013-11-05 DIAGNOSIS — G20A1 Parkinson's disease without dyskinesia, without mention of fluctuations: Secondary | ICD-10-CM | POA: Diagnosis not present

## 2013-11-05 DIAGNOSIS — F0280 Dementia in other diseases classified elsewhere without behavioral disturbance: Secondary | ICD-10-CM | POA: Diagnosis not present

## 2013-11-05 DIAGNOSIS — R488 Other symbolic dysfunctions: Secondary | ICD-10-CM | POA: Diagnosis not present

## 2013-11-05 DIAGNOSIS — M21959 Unspecified acquired deformity of unspecified thigh: Secondary | ICD-10-CM | POA: Diagnosis not present

## 2013-11-05 DIAGNOSIS — D649 Anemia, unspecified: Secondary | ICD-10-CM | POA: Diagnosis not present

## 2013-11-05 DIAGNOSIS — N182 Chronic kidney disease, stage 2 (mild): Secondary | ICD-10-CM | POA: Diagnosis not present

## 2013-11-05 DIAGNOSIS — S329XXA Fracture of unspecified parts of lumbosacral spine and pelvis, initial encounter for closed fracture: Secondary | ICD-10-CM | POA: Diagnosis not present

## 2013-11-05 DIAGNOSIS — N390 Urinary tract infection, site not specified: Secondary | ICD-10-CM | POA: Diagnosis not present

## 2013-11-05 DIAGNOSIS — F3289 Other specified depressive episodes: Secondary | ICD-10-CM | POA: Diagnosis not present

## 2013-11-05 DIAGNOSIS — R269 Unspecified abnormalities of gait and mobility: Secondary | ICD-10-CM

## 2013-11-07 DIAGNOSIS — R269 Unspecified abnormalities of gait and mobility: Secondary | ICD-10-CM | POA: Diagnosis not present

## 2013-11-07 DIAGNOSIS — D649 Anemia, unspecified: Secondary | ICD-10-CM | POA: Diagnosis not present

## 2013-11-07 DIAGNOSIS — Z87440 Personal history of urinary (tract) infections: Secondary | ICD-10-CM | POA: Diagnosis not present

## 2013-11-07 DIAGNOSIS — N390 Urinary tract infection, site not specified: Secondary | ICD-10-CM | POA: Diagnosis not present

## 2013-11-07 DIAGNOSIS — S329XXA Fracture of unspecified parts of lumbosacral spine and pelvis, initial encounter for closed fracture: Secondary | ICD-10-CM | POA: Diagnosis not present

## 2013-11-07 NOTE — Progress Notes (Addendum)
Patient ID: [name] Oliver, female   DOB: 1932-12-14, 80 y.o.   MRN: [id number]                  PROGRESS NOTE  DATE:  11/05/2013    FACILITY: [facility]    LEVEL OF CARE:   SNF   Acute Visit/Discharge Visit      CHIEF COMPLAINT:  Review of pre-discharge.    HISTORY OF PRESENT ILLNESS:  Mrs. [name] Oliver is a 80 year-old lady who came to [place] after suffering right-sided pubic rami fractures and a nondisplaced sacral fracture secondary to a fall.    She also had problems with urinary retention.     She was felt to have mild cognitive impairment, not meeting the criteria for dementia.    On her arrival here, she had significant bradykinesia and rigidity as well as a Parkinson's-like gait.  A further gait assessment was hampered by pain from the pelvic fractures and sacral fracture.  We were able to remove her Foley catheter.    We also assessed her mental status on at least two occasions.  She scored 20/30 on the Folstein when it was done by the [facility] here, 22/30 when I did this myself.  She had a very abnormal clock test.  This is not mild cognitive impairment.  This represents mild dementia.    With regards to her parkinsonism, I started her on Sinemet and she has made a good improvement in terms of her bradykinesia and rigidity.  She did not respond quite as well with the gait issues.  There were some problems with physical therapy in terms of progression and she is being discharged today to another skilled facility to continue therapy.    PHYSICAL EXAMINATION:   NEUROLOGICAL:    SENSATION/STRENGTH:  Her tone is totally normal and she has more facial expression and freedom of movement.   BALANCE/GAIT:  Her gait is still not good with a classic form of a Parkinson's gait with flexed posture, shifting center of gravity, etc.    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  She has done better with Sinemet and I think  this should continue.  Indeed, I think the dose probably should be increased.        Pelvic fractures that seem to have resolved.  She also has a sacral fracture.     Dementia in the setting of Parkinson's disease.  This is not unheard of.  The response to Sinemet would be unlikely in any of the close mimickers of Parkinson's, however.  She was according to her family a lot more confused last week. I have not seen this  CPT CODE: 07680 (greater than 30 minutes, extensively discussed with the family, staff).

## 2013-11-12 DIAGNOSIS — M21959 Unspecified acquired deformity of unspecified thigh: Secondary | ICD-10-CM | POA: Diagnosis not present

## 2013-11-26 DIAGNOSIS — N390 Urinary tract infection, site not specified: Secondary | ICD-10-CM | POA: Diagnosis not present

## 2013-11-26 DIAGNOSIS — R269 Unspecified abnormalities of gait and mobility: Secondary | ICD-10-CM | POA: Diagnosis not present

## 2013-11-26 DIAGNOSIS — D649 Anemia, unspecified: Secondary | ICD-10-CM | POA: Diagnosis not present

## 2013-11-26 DIAGNOSIS — Z87440 Personal history of urinary (tract) infections: Secondary | ICD-10-CM | POA: Diagnosis not present

## 2013-11-26 DIAGNOSIS — S329XXA Fracture of unspecified parts of lumbosacral spine and pelvis, initial encounter for closed fracture: Secondary | ICD-10-CM | POA: Diagnosis not present

## 2013-12-05 DIAGNOSIS — N182 Chronic kidney disease, stage 2 (mild): Secondary | ICD-10-CM | POA: Diagnosis not present

## 2013-12-05 DIAGNOSIS — R269 Unspecified abnormalities of gait and mobility: Secondary | ICD-10-CM | POA: Diagnosis not present

## 2013-12-05 DIAGNOSIS — K5900 Constipation, unspecified: Secondary | ICD-10-CM | POA: Diagnosis not present

## 2013-12-05 DIAGNOSIS — D649 Anemia, unspecified: Secondary | ICD-10-CM | POA: Diagnosis not present

## 2013-12-05 DIAGNOSIS — Z87440 Personal history of urinary (tract) infections: Secondary | ICD-10-CM | POA: Diagnosis not present

## 2013-12-05 DIAGNOSIS — F329 Major depressive disorder, single episode, unspecified: Secondary | ICD-10-CM | POA: Diagnosis not present

## 2013-12-05 DIAGNOSIS — F411 Generalized anxiety disorder: Secondary | ICD-10-CM | POA: Diagnosis not present

## 2013-12-05 DIAGNOSIS — M6281 Muscle weakness (generalized): Secondary | ICD-10-CM | POA: Diagnosis not present

## 2013-12-05 DIAGNOSIS — S329XXA Fracture of unspecified parts of lumbosacral spine and pelvis, initial encounter for closed fracture: Secondary | ICD-10-CM | POA: Diagnosis not present

## 2013-12-05 DIAGNOSIS — R339 Retention of urine, unspecified: Secondary | ICD-10-CM | POA: Diagnosis not present

## 2013-12-05 DIAGNOSIS — F0280 Dementia in other diseases classified elsewhere without behavioral disturbance: Secondary | ICD-10-CM | POA: Diagnosis not present

## 2013-12-05 DIAGNOSIS — G20 Parkinson's disease: Secondary | ICD-10-CM | POA: Diagnosis not present

## 2013-12-05 DIAGNOSIS — R488 Other symbolic dysfunctions: Secondary | ICD-10-CM | POA: Diagnosis not present

## 2013-12-05 DIAGNOSIS — R262 Difficulty in walking, not elsewhere classified: Secondary | ICD-10-CM | POA: Diagnosis not present

## 2013-12-24 DIAGNOSIS — S329XXA Fracture of unspecified parts of lumbosacral spine and pelvis, initial encounter for closed fracture: Secondary | ICD-10-CM | POA: Diagnosis not present

## 2014-01-02 ENCOUNTER — Ambulatory Visit: Payer: Medicare Other | Admitting: Neurology

## 2014-01-02 DIAGNOSIS — R269 Unspecified abnormalities of gait and mobility: Secondary | ICD-10-CM | POA: Diagnosis not present

## 2014-01-02 DIAGNOSIS — N182 Chronic kidney disease, stage 2 (mild): Secondary | ICD-10-CM | POA: Diagnosis not present

## 2014-01-02 DIAGNOSIS — D649 Anemia, unspecified: Secondary | ICD-10-CM | POA: Diagnosis not present

## 2014-01-02 DIAGNOSIS — Z87440 Personal history of urinary (tract) infections: Secondary | ICD-10-CM | POA: Diagnosis not present

## 2014-01-08 ENCOUNTER — Encounter: Payer: Self-pay | Admitting: Neurology

## 2014-01-08 ENCOUNTER — Ambulatory Visit (INDEPENDENT_AMBULATORY_CARE_PROVIDER_SITE_OTHER): Payer: Medicare Other | Admitting: Neurology

## 2014-01-08 VITALS — BP 147/76 | HR 77 | Ht 63.0 in | Wt 119.0 lb

## 2014-01-08 DIAGNOSIS — R413 Other amnesia: Secondary | ICD-10-CM

## 2014-01-08 DIAGNOSIS — R269 Unspecified abnormalities of gait and mobility: Secondary | ICD-10-CM

## 2014-01-08 DIAGNOSIS — G20 Parkinson's disease: Secondary | ICD-10-CM

## 2014-01-08 HISTORY — DX: Other amnesia: R41.3

## 2014-01-08 MED ORDER — MIRTAZAPINE 15 MG PO TABS: 15.0000 mg | ORAL_TABLET | Freq: Every day | ORAL | Status: DC

## 2014-01-08 NOTE — Patient Instructions (Signed)

## 2014-01-08 NOTE — Progress Notes (Signed)
Reason for visit: Memory disorder, parkinsonism  [name] Oliver is a 80 y.o. female  History of present illness:  [name] Oliver is a 80 year old right-handed white female with a history of a gait disorder that dates back about 18 months. The patient has sustained a fall that occurred on 08/19/2013 with a sacral fracture. She has been in an extended care facility since that time, with a recent transition to an independent living facility. The patient has had some problems with shuffling her feet, and the family describes a situation where she was dragging her left foot. The patient will shuffle her feet with turns. Her posture have become more stooped. Along with this, she has developed some memory problems. The patient has short-term memory issues, and difficulty remembering names. She has not been able to keep up with appointments and medications. She has had some increased confusion associated with frequent urinary tract infections, and medications for pain. The mental status has improved recently. The patient was given a trial on Sinemet over the last several weeks, taking the 25/100 mg tablets twice daily. It was felt that this did help her ability to ambulate. The family confirms this, and the patient was also getting physical therapy until just recently. The patient now walks with a walker, and she does fairly well with this. No further falls have been noted. The patient has undergone a MRI of the brain in September 2014, this was done at [facility], and the results are not available to me. The patient was told that this study was normal. The patient has had some urinary incontinence issues that are new for her. She has developed some problems with anxiety and depression, and troubles with appetite with weight loss, and difficulty with insomnia. She comes to this office for an evaluation. The patient has had some problems with fatigue and mood swings. The patient denies any problems with focal  numbness or weakness of the face, arms, or legs. The patient indicates that at times she feels as if she may have a tremor. The patient's sister had Parkinson's disease.  Past Medical History  Diagnosis Date  . Chronic cough   . Hip fracture   . Pelvis fracture   . Memory difficulty 01/08/2014    Past Surgical History  Procedure Laterality Date  . Fracture surgery    . Abdominal hysterectomy    . Joint replacement    . Cataract extraction Bilateral     03/14/13  . Breast lumpectomy Right   . Abdominal hysterectomy    . Total hip arthroplasty Right     Family History  Problem Relation Age of Onset  . Diabetes Other   . Dementia Mother   . Bipolar disorder Sister   . Parkinsonism Sister     Social history:  reports that she has never smoked. She has never used smokeless tobacco. She reports that she does not drink alcohol or use illicit drugs.  Medications:  Current Outpatient Prescriptions on File Prior to Visit  Medication Sig Dispense Refill  . clonazePAM (KLONOPIN) 0.5 MG tablet Take 1/2 tablet by mouth twice daily as needed for anxiety.  30 tablet  0  . docusate sodium 100 MG CAPS Take 100 mg by mouth 2 (two) times daily.      . famotidine (PEPCID) 20 MG tablet Take 1 tablet (20 mg total) by mouth 2 (two) times daily. Take while on scheduled ibuprofen.      Marland Kitchen HYDROcodone-acetaminophen (NORCO/VICODIN) 5-325 MG per tablet Take  one tablet by mouth every 6 hours as needed for moderate pain  120 tablet  0  . ibuprofen (ADVIL,MOTRIN) 400 MG tablet Take 1 tablet (400 mg total) by mouth 4 (four) times daily. For 3 days, then use every 6 hours as needed for mild pain.      . polyethylene glycol (MIRALAX / GLYCOLAX) packet Take 17 g by mouth 2 (two) times daily.      Marland Kitchen senna (SENOKOT) 8.6 MG TABS tablet Take 1 tablet (8.6 mg total) by mouth at bedtime.       No current facility-administered medications on file prior to visit.     No Known Allergies  ROS:  Out of a complete 14  system review of symptoms, the patient complains only of the following symptoms, and all other reviewed systems are negative.  Weight loss, fatigue Palpitations of the heart Moles Blurred vision Cough Urinary incontinence Memory problems, confusion, weakness, slurred speech, dizziness, tremor Depression, anxiety, decreased energy, change in appetite, disinterest in activities Insomnia  Blood pressure 147/76, pulse 77, height 5\' 3"  (1.6 m), weight 119 lb (53.978 kg).  Physical Exam  General: The patient is alert and cooperative at the time of the examination.  Eyes: Pupils are equal, round, and reactive to light. Discs are flat bilaterally.  Neck: The neck is supple, no carotid bruits are noted.  Respiratory: The respiratory examination is clear.  Cardiovascular: The cardiovascular examination reveals a regular rate and rhythm, no obvious murmurs or rubs are noted.  Skin: Extremities are without significant edema.  Neurologic Exam  Mental status: The Mini-Mental status examination done today shows a total score 26/30.  Cranial nerves: Facial symmetry is present. There is good sensation of the face to pinprick and soft touch bilaterally. The strength of the facial muscles and the muscles to head turning and shoulder shrug are normal bilaterally. Speech is well enunciated, no aphasia or dysarthria is noted. Extraocular movements are full. Visual fields are full. The tongue is midline, and the patient has symmetric elevation of the soft palate. No obvious hearing deficits are noted. Affect is somewhat flat.  Motor: The motor testing reveals 5 over 5 strength of all 4 extremities. Good symmetric motor tone is noted throughout.  Sensory: Sensory testing is intact to pinprick, soft touch, vibration sensation, and position sense on all 4 extremities, with exception that position sense is decreased in both feet, and on the left arm. No evidence of extinction is noted.  Coordination:  Cerebellar testing reveals good finger-nose-finger and heel-to-shin bilaterally. No resting tremors were seen.  Gait and station: Gait is unsteady, the patient walks with a walker. Posture is somewhat stooped. The patient is able to rise with some difficulty from a chair with the arms crossed. Tandem gait was not attempted. The patient does shuffle some with turns. The patient has better stride with the use of the walker. Romberg is negative. No drift is seen.  Reflexes: Deep tendon reflexes are symmetric and normal bilaterally. Toes are downgoing bilaterally.   Assessment/Plan:  1. Parkinsonism  2. Gait disorder  3. Memory disorder  4. Anxiety, depression  5. Weight loss  6. Insomnia  The patient will be maintained on Sinemet currently taking the 25/100 mg tablets twice daily. There are indications that this has helped her ability to ambulate. The patient does not wish to go on medications for memory at this time. She will be taken off of trazodone, and switched to mirtazapine to help with the sleep problem, appetite,  anxiety and depression. She will followup in 4 months. We will need to get the report of the MRI of the brain that was done in September 2014.  Marlan Palau. Keith Willis MD 01/08/2014 8:22 PM  [facility] [address] [place], [place] [address]  Phone [phone] Fax [phone]

## 2014-01-09 DIAGNOSIS — N390 Urinary tract infection, site not specified: Secondary | ICD-10-CM | POA: Diagnosis not present

## 2014-01-13 DIAGNOSIS — F411 Generalized anxiety disorder: Secondary | ICD-10-CM | POA: Diagnosis not present

## 2014-01-13 DIAGNOSIS — F22 Delusional disorders: Secondary | ICD-10-CM | POA: Diagnosis not present

## 2014-01-13 DIAGNOSIS — S338XXA Sprain of other parts of lumbar spine and pelvis, initial encounter: Secondary | ICD-10-CM | POA: Diagnosis not present

## 2014-01-13 DIAGNOSIS — N390 Urinary tract infection, site not specified: Secondary | ICD-10-CM | POA: Diagnosis not present

## 2014-01-13 DIAGNOSIS — G219 Secondary parkinsonism, unspecified: Secondary | ICD-10-CM | POA: Diagnosis not present

## 2014-01-13 DIAGNOSIS — M6281 Muscle weakness (generalized): Secondary | ICD-10-CM | POA: Diagnosis not present

## 2014-01-15 DIAGNOSIS — R339 Retention of urine, unspecified: Secondary | ICD-10-CM | POA: Diagnosis not present

## 2014-01-15 DIAGNOSIS — F411 Generalized anxiety disorder: Secondary | ICD-10-CM | POA: Diagnosis not present

## 2014-01-15 DIAGNOSIS — N390 Urinary tract infection, site not specified: Secondary | ICD-10-CM | POA: Diagnosis not present

## 2014-01-15 DIAGNOSIS — F329 Major depressive disorder, single episode, unspecified: Secondary | ICD-10-CM | POA: Diagnosis not present

## 2014-01-15 DIAGNOSIS — G20 Parkinson's disease: Secondary | ICD-10-CM | POA: Diagnosis not present

## 2014-01-15 DIAGNOSIS — F0390 Unspecified dementia without behavioral disturbance: Secondary | ICD-10-CM | POA: Diagnosis not present

## 2014-01-15 DIAGNOSIS — S3289XA Fracture of other parts of pelvis, initial encounter for closed fracture: Secondary | ICD-10-CM | POA: Diagnosis not present

## 2014-01-15 DIAGNOSIS — F22 Delusional disorders: Secondary | ICD-10-CM | POA: Diagnosis not present

## 2014-01-16 DIAGNOSIS — R339 Retention of urine, unspecified: Secondary | ICD-10-CM | POA: Diagnosis not present

## 2014-01-16 DIAGNOSIS — F329 Major depressive disorder, single episode, unspecified: Secondary | ICD-10-CM | POA: Diagnosis not present

## 2014-01-16 DIAGNOSIS — F0390 Unspecified dementia without behavioral disturbance: Secondary | ICD-10-CM | POA: Diagnosis not present

## 2014-01-16 DIAGNOSIS — S3289XA Fracture of other parts of pelvis, initial encounter for closed fracture: Secondary | ICD-10-CM | POA: Diagnosis not present

## 2014-01-16 DIAGNOSIS — G20 Parkinson's disease: Secondary | ICD-10-CM | POA: Diagnosis not present

## 2014-01-16 DIAGNOSIS — N390 Urinary tract infection, site not specified: Secondary | ICD-10-CM | POA: Diagnosis not present

## 2014-01-18 DIAGNOSIS — N390 Urinary tract infection, site not specified: Secondary | ICD-10-CM | POA: Diagnosis not present

## 2014-01-18 DIAGNOSIS — R339 Retention of urine, unspecified: Secondary | ICD-10-CM | POA: Diagnosis not present

## 2014-01-18 DIAGNOSIS — S3289XA Fracture of other parts of pelvis, initial encounter for closed fracture: Secondary | ICD-10-CM | POA: Diagnosis not present

## 2014-01-18 DIAGNOSIS — G20 Parkinson's disease: Secondary | ICD-10-CM | POA: Diagnosis not present

## 2014-01-18 DIAGNOSIS — F0390 Unspecified dementia without behavioral disturbance: Secondary | ICD-10-CM | POA: Diagnosis not present

## 2014-01-18 DIAGNOSIS — F329 Major depressive disorder, single episode, unspecified: Secondary | ICD-10-CM | POA: Diagnosis not present

## 2014-01-21 DIAGNOSIS — G20 Parkinson's disease: Secondary | ICD-10-CM | POA: Diagnosis not present

## 2014-01-21 DIAGNOSIS — F0390 Unspecified dementia without behavioral disturbance: Secondary | ICD-10-CM | POA: Diagnosis not present

## 2014-01-21 DIAGNOSIS — N390 Urinary tract infection, site not specified: Secondary | ICD-10-CM | POA: Diagnosis not present

## 2014-01-21 DIAGNOSIS — S3289XA Fracture of other parts of pelvis, initial encounter for closed fracture: Secondary | ICD-10-CM | POA: Diagnosis not present

## 2014-01-21 DIAGNOSIS — R339 Retention of urine, unspecified: Secondary | ICD-10-CM | POA: Diagnosis not present

## 2014-01-21 DIAGNOSIS — F329 Major depressive disorder, single episode, unspecified: Secondary | ICD-10-CM | POA: Diagnosis not present

## 2014-01-22 ENCOUNTER — Telehealth: Payer: Self-pay | Admitting: Neurology

## 2014-01-22 DIAGNOSIS — F0390 Unspecified dementia without behavioral disturbance: Secondary | ICD-10-CM | POA: Diagnosis not present

## 2014-01-22 DIAGNOSIS — R339 Retention of urine, unspecified: Secondary | ICD-10-CM | POA: Diagnosis not present

## 2014-01-22 DIAGNOSIS — N390 Urinary tract infection, site not specified: Secondary | ICD-10-CM | POA: Diagnosis not present

## 2014-01-22 DIAGNOSIS — G20 Parkinson's disease: Secondary | ICD-10-CM | POA: Diagnosis not present

## 2014-01-22 DIAGNOSIS — F329 Major depressive disorder, single episode, unspecified: Secondary | ICD-10-CM | POA: Diagnosis not present

## 2014-01-22 DIAGNOSIS — S3289XA Fracture of other parts of pelvis, initial encounter for closed fracture: Secondary | ICD-10-CM | POA: Diagnosis not present

## 2014-01-22 NOTE — Telephone Encounter (Signed)
Results of the MRI brain done on 03/02/2013 shows evidence of no acute changes, mild nonspecific white matter changes are seen. No other significant abnormalities were noted.

## 2014-01-24 DIAGNOSIS — R339 Retention of urine, unspecified: Secondary | ICD-10-CM | POA: Diagnosis not present

## 2014-01-24 DIAGNOSIS — S3289XA Fracture of other parts of pelvis, initial encounter for closed fracture: Secondary | ICD-10-CM | POA: Diagnosis not present

## 2014-01-24 DIAGNOSIS — F329 Major depressive disorder, single episode, unspecified: Secondary | ICD-10-CM | POA: Diagnosis not present

## 2014-01-24 DIAGNOSIS — G20 Parkinson's disease: Secondary | ICD-10-CM | POA: Diagnosis not present

## 2014-01-24 DIAGNOSIS — F0390 Unspecified dementia without behavioral disturbance: Secondary | ICD-10-CM | POA: Diagnosis not present

## 2014-01-24 DIAGNOSIS — N390 Urinary tract infection, site not specified: Secondary | ICD-10-CM | POA: Diagnosis not present

## 2014-01-25 DIAGNOSIS — E782 Mixed hyperlipidemia: Secondary | ICD-10-CM | POA: Diagnosis not present

## 2014-01-25 DIAGNOSIS — D510 Vitamin B12 deficiency anemia due to intrinsic factor deficiency: Secondary | ICD-10-CM | POA: Diagnosis not present

## 2014-01-25 DIAGNOSIS — F0390 Unspecified dementia without behavioral disturbance: Secondary | ICD-10-CM | POA: Diagnosis not present

## 2014-01-25 DIAGNOSIS — E878 Other disorders of electrolyte and fluid balance, not elsewhere classified: Secondary | ICD-10-CM | POA: Diagnosis not present

## 2014-01-25 DIAGNOSIS — E039 Hypothyroidism, unspecified: Secondary | ICD-10-CM | POA: Diagnosis not present

## 2014-01-25 DIAGNOSIS — E119 Type 2 diabetes mellitus without complications: Secondary | ICD-10-CM | POA: Diagnosis not present

## 2014-01-25 DIAGNOSIS — G20 Parkinson's disease: Secondary | ICD-10-CM | POA: Diagnosis not present

## 2014-01-25 DIAGNOSIS — S3289XA Fracture of other parts of pelvis, initial encounter for closed fracture: Secondary | ICD-10-CM | POA: Diagnosis not present

## 2014-01-25 DIAGNOSIS — F329 Major depressive disorder, single episode, unspecified: Secondary | ICD-10-CM | POA: Diagnosis not present

## 2014-01-25 DIAGNOSIS — N390 Urinary tract infection, site not specified: Secondary | ICD-10-CM | POA: Diagnosis not present

## 2014-01-25 DIAGNOSIS — R339 Retention of urine, unspecified: Secondary | ICD-10-CM | POA: Diagnosis not present

## 2014-01-28 DIAGNOSIS — N390 Urinary tract infection, site not specified: Secondary | ICD-10-CM | POA: Diagnosis not present

## 2014-01-28 DIAGNOSIS — G20 Parkinson's disease: Secondary | ICD-10-CM | POA: Diagnosis not present

## 2014-01-28 DIAGNOSIS — F329 Major depressive disorder, single episode, unspecified: Secondary | ICD-10-CM | POA: Diagnosis not present

## 2014-01-28 DIAGNOSIS — F0390 Unspecified dementia without behavioral disturbance: Secondary | ICD-10-CM | POA: Diagnosis not present

## 2014-01-28 DIAGNOSIS — R339 Retention of urine, unspecified: Secondary | ICD-10-CM | POA: Diagnosis not present

## 2014-01-28 DIAGNOSIS — S3289XA Fracture of other parts of pelvis, initial encounter for closed fracture: Secondary | ICD-10-CM | POA: Diagnosis not present

## 2014-01-29 DIAGNOSIS — R339 Retention of urine, unspecified: Secondary | ICD-10-CM | POA: Diagnosis not present

## 2014-01-29 DIAGNOSIS — F329 Major depressive disorder, single episode, unspecified: Secondary | ICD-10-CM | POA: Diagnosis not present

## 2014-01-29 DIAGNOSIS — F0390 Unspecified dementia without behavioral disturbance: Secondary | ICD-10-CM | POA: Diagnosis not present

## 2014-01-29 DIAGNOSIS — G20 Parkinson's disease: Secondary | ICD-10-CM | POA: Diagnosis not present

## 2014-01-29 DIAGNOSIS — F22 Delusional disorders: Secondary | ICD-10-CM | POA: Diagnosis not present

## 2014-01-29 DIAGNOSIS — F411 Generalized anxiety disorder: Secondary | ICD-10-CM | POA: Diagnosis not present

## 2014-01-29 DIAGNOSIS — S3289XA Fracture of other parts of pelvis, initial encounter for closed fracture: Secondary | ICD-10-CM | POA: Diagnosis not present

## 2014-01-29 DIAGNOSIS — N390 Urinary tract infection, site not specified: Secondary | ICD-10-CM | POA: Diagnosis not present

## 2014-01-31 DIAGNOSIS — F0390 Unspecified dementia without behavioral disturbance: Secondary | ICD-10-CM | POA: Diagnosis not present

## 2014-01-31 DIAGNOSIS — F329 Major depressive disorder, single episode, unspecified: Secondary | ICD-10-CM | POA: Diagnosis not present

## 2014-01-31 DIAGNOSIS — N390 Urinary tract infection, site not specified: Secondary | ICD-10-CM | POA: Diagnosis not present

## 2014-01-31 DIAGNOSIS — G20 Parkinson's disease: Secondary | ICD-10-CM | POA: Diagnosis not present

## 2014-01-31 DIAGNOSIS — S3289XA Fracture of other parts of pelvis, initial encounter for closed fracture: Secondary | ICD-10-CM | POA: Diagnosis not present

## 2014-01-31 DIAGNOSIS — R339 Retention of urine, unspecified: Secondary | ICD-10-CM | POA: Diagnosis not present

## 2014-02-04 DIAGNOSIS — G20 Parkinson's disease: Secondary | ICD-10-CM | POA: Diagnosis not present

## 2014-02-04 DIAGNOSIS — F329 Major depressive disorder, single episode, unspecified: Secondary | ICD-10-CM | POA: Diagnosis not present

## 2014-02-04 DIAGNOSIS — S3289XA Fracture of other parts of pelvis, initial encounter for closed fracture: Secondary | ICD-10-CM | POA: Diagnosis not present

## 2014-02-04 DIAGNOSIS — R339 Retention of urine, unspecified: Secondary | ICD-10-CM | POA: Diagnosis not present

## 2014-02-04 DIAGNOSIS — F0390 Unspecified dementia without behavioral disturbance: Secondary | ICD-10-CM | POA: Diagnosis not present

## 2014-02-04 DIAGNOSIS — N390 Urinary tract infection, site not specified: Secondary | ICD-10-CM | POA: Diagnosis not present

## 2014-02-06 DIAGNOSIS — N390 Urinary tract infection, site not specified: Secondary | ICD-10-CM | POA: Diagnosis not present

## 2014-02-06 DIAGNOSIS — F329 Major depressive disorder, single episode, unspecified: Secondary | ICD-10-CM | POA: Diagnosis not present

## 2014-02-06 DIAGNOSIS — D510 Vitamin B12 deficiency anemia due to intrinsic factor deficiency: Secondary | ICD-10-CM | POA: Diagnosis not present

## 2014-02-06 DIAGNOSIS — E039 Hypothyroidism, unspecified: Secondary | ICD-10-CM | POA: Diagnosis not present

## 2014-02-06 DIAGNOSIS — G219 Secondary parkinsonism, unspecified: Secondary | ICD-10-CM | POA: Diagnosis not present

## 2014-02-06 DIAGNOSIS — E878 Other disorders of electrolyte and fluid balance, not elsewhere classified: Secondary | ICD-10-CM | POA: Diagnosis not present

## 2014-02-06 DIAGNOSIS — S3289XA Fracture of other parts of pelvis, initial encounter for closed fracture: Secondary | ICD-10-CM | POA: Diagnosis not present

## 2014-02-06 DIAGNOSIS — F411 Generalized anxiety disorder: Secondary | ICD-10-CM | POA: Diagnosis not present

## 2014-02-06 DIAGNOSIS — E782 Mixed hyperlipidemia: Secondary | ICD-10-CM | POA: Diagnosis not present

## 2014-02-06 DIAGNOSIS — F22 Delusional disorders: Secondary | ICD-10-CM | POA: Diagnosis not present

## 2014-02-06 DIAGNOSIS — M6281 Muscle weakness (generalized): Secondary | ICD-10-CM | POA: Diagnosis not present

## 2014-02-06 DIAGNOSIS — R339 Retention of urine, unspecified: Secondary | ICD-10-CM | POA: Diagnosis not present

## 2014-02-06 DIAGNOSIS — S338XXA Sprain of other parts of lumbar spine and pelvis, initial encounter: Secondary | ICD-10-CM | POA: Diagnosis not present

## 2014-02-06 DIAGNOSIS — G20 Parkinson's disease: Secondary | ICD-10-CM | POA: Diagnosis not present

## 2014-02-06 DIAGNOSIS — F0390 Unspecified dementia without behavioral disturbance: Secondary | ICD-10-CM | POA: Diagnosis not present

## 2014-02-08 DIAGNOSIS — F329 Major depressive disorder, single episode, unspecified: Secondary | ICD-10-CM | POA: Diagnosis not present

## 2014-02-08 DIAGNOSIS — G20 Parkinson's disease: Secondary | ICD-10-CM | POA: Diagnosis not present

## 2014-02-08 DIAGNOSIS — F0390 Unspecified dementia without behavioral disturbance: Secondary | ICD-10-CM | POA: Diagnosis not present

## 2014-02-08 DIAGNOSIS — S3289XA Fracture of other parts of pelvis, initial encounter for closed fracture: Secondary | ICD-10-CM | POA: Diagnosis not present

## 2014-02-08 DIAGNOSIS — R339 Retention of urine, unspecified: Secondary | ICD-10-CM | POA: Diagnosis not present

## 2014-02-08 DIAGNOSIS — N390 Urinary tract infection, site not specified: Secondary | ICD-10-CM | POA: Diagnosis not present

## 2014-02-12 DIAGNOSIS — R339 Retention of urine, unspecified: Secondary | ICD-10-CM | POA: Diagnosis not present

## 2014-02-12 DIAGNOSIS — F0390 Unspecified dementia without behavioral disturbance: Secondary | ICD-10-CM | POA: Diagnosis not present

## 2014-02-12 DIAGNOSIS — S3289XA Fracture of other parts of pelvis, initial encounter for closed fracture: Secondary | ICD-10-CM | POA: Diagnosis not present

## 2014-02-12 DIAGNOSIS — G20 Parkinson's disease: Secondary | ICD-10-CM | POA: Diagnosis not present

## 2014-02-12 DIAGNOSIS — N390 Urinary tract infection, site not specified: Secondary | ICD-10-CM | POA: Diagnosis not present

## 2014-02-12 DIAGNOSIS — F329 Major depressive disorder, single episode, unspecified: Secondary | ICD-10-CM | POA: Diagnosis not present

## 2014-02-13 DIAGNOSIS — R339 Retention of urine, unspecified: Secondary | ICD-10-CM | POA: Diagnosis not present

## 2014-02-13 DIAGNOSIS — S3289XA Fracture of other parts of pelvis, initial encounter for closed fracture: Secondary | ICD-10-CM | POA: Diagnosis not present

## 2014-02-13 DIAGNOSIS — F0390 Unspecified dementia without behavioral disturbance: Secondary | ICD-10-CM | POA: Diagnosis not present

## 2014-02-13 DIAGNOSIS — F329 Major depressive disorder, single episode, unspecified: Secondary | ICD-10-CM | POA: Diagnosis not present

## 2014-02-13 DIAGNOSIS — G20 Parkinson's disease: Secondary | ICD-10-CM | POA: Diagnosis not present

## 2014-02-13 DIAGNOSIS — N390 Urinary tract infection, site not specified: Secondary | ICD-10-CM | POA: Diagnosis not present

## 2014-02-14 DIAGNOSIS — R339 Retention of urine, unspecified: Secondary | ICD-10-CM | POA: Diagnosis not present

## 2014-02-14 DIAGNOSIS — F329 Major depressive disorder, single episode, unspecified: Secondary | ICD-10-CM | POA: Diagnosis not present

## 2014-02-14 DIAGNOSIS — N390 Urinary tract infection, site not specified: Secondary | ICD-10-CM | POA: Diagnosis not present

## 2014-02-14 DIAGNOSIS — G20 Parkinson's disease: Secondary | ICD-10-CM | POA: Diagnosis not present

## 2014-02-14 DIAGNOSIS — S3289XA Fracture of other parts of pelvis, initial encounter for closed fracture: Secondary | ICD-10-CM | POA: Diagnosis not present

## 2014-02-14 DIAGNOSIS — F0390 Unspecified dementia without behavioral disturbance: Secondary | ICD-10-CM | POA: Diagnosis not present

## 2014-02-15 DIAGNOSIS — R339 Retention of urine, unspecified: Secondary | ICD-10-CM | POA: Diagnosis not present

## 2014-02-15 DIAGNOSIS — F329 Major depressive disorder, single episode, unspecified: Secondary | ICD-10-CM | POA: Diagnosis not present

## 2014-02-15 DIAGNOSIS — G20 Parkinson's disease: Secondary | ICD-10-CM | POA: Diagnosis not present

## 2014-02-15 DIAGNOSIS — N390 Urinary tract infection, site not specified: Secondary | ICD-10-CM | POA: Diagnosis not present

## 2014-02-15 DIAGNOSIS — S3289XA Fracture of other parts of pelvis, initial encounter for closed fracture: Secondary | ICD-10-CM | POA: Diagnosis not present

## 2014-02-15 DIAGNOSIS — F0390 Unspecified dementia without behavioral disturbance: Secondary | ICD-10-CM | POA: Diagnosis not present

## 2014-02-18 DIAGNOSIS — N390 Urinary tract infection, site not specified: Secondary | ICD-10-CM | POA: Diagnosis not present

## 2014-02-18 DIAGNOSIS — R339 Retention of urine, unspecified: Secondary | ICD-10-CM | POA: Diagnosis not present

## 2014-02-18 DIAGNOSIS — S3289XA Fracture of other parts of pelvis, initial encounter for closed fracture: Secondary | ICD-10-CM | POA: Diagnosis not present

## 2014-02-18 DIAGNOSIS — G20 Parkinson's disease: Secondary | ICD-10-CM | POA: Diagnosis not present

## 2014-02-18 DIAGNOSIS — F329 Major depressive disorder, single episode, unspecified: Secondary | ICD-10-CM | POA: Diagnosis not present

## 2014-02-18 DIAGNOSIS — F0390 Unspecified dementia without behavioral disturbance: Secondary | ICD-10-CM | POA: Diagnosis not present

## 2014-02-19 DIAGNOSIS — S3289XA Fracture of other parts of pelvis, initial encounter for closed fracture: Secondary | ICD-10-CM | POA: Diagnosis not present

## 2014-02-19 DIAGNOSIS — R339 Retention of urine, unspecified: Secondary | ICD-10-CM | POA: Diagnosis not present

## 2014-02-19 DIAGNOSIS — F0390 Unspecified dementia without behavioral disturbance: Secondary | ICD-10-CM | POA: Diagnosis not present

## 2014-02-19 DIAGNOSIS — F329 Major depressive disorder, single episode, unspecified: Secondary | ICD-10-CM | POA: Diagnosis not present

## 2014-02-19 DIAGNOSIS — G20 Parkinson's disease: Secondary | ICD-10-CM | POA: Diagnosis not present

## 2014-02-19 DIAGNOSIS — N390 Urinary tract infection, site not specified: Secondary | ICD-10-CM | POA: Diagnosis not present

## 2014-02-21 DIAGNOSIS — S3289XA Fracture of other parts of pelvis, initial encounter for closed fracture: Secondary | ICD-10-CM | POA: Diagnosis not present

## 2014-02-21 DIAGNOSIS — N390 Urinary tract infection, site not specified: Secondary | ICD-10-CM | POA: Diagnosis not present

## 2014-02-21 DIAGNOSIS — Z23 Encounter for immunization: Secondary | ICD-10-CM | POA: Diagnosis not present

## 2014-02-21 DIAGNOSIS — R339 Retention of urine, unspecified: Secondary | ICD-10-CM | POA: Diagnosis not present

## 2014-02-21 DIAGNOSIS — F0390 Unspecified dementia without behavioral disturbance: Secondary | ICD-10-CM | POA: Diagnosis not present

## 2014-02-21 DIAGNOSIS — G20 Parkinson's disease: Secondary | ICD-10-CM | POA: Diagnosis not present

## 2014-02-21 DIAGNOSIS — F329 Major depressive disorder, single episode, unspecified: Secondary | ICD-10-CM | POA: Diagnosis not present

## 2014-02-25 DIAGNOSIS — F329 Major depressive disorder, single episode, unspecified: Secondary | ICD-10-CM | POA: Diagnosis not present

## 2014-02-25 DIAGNOSIS — G20 Parkinson's disease: Secondary | ICD-10-CM | POA: Diagnosis not present

## 2014-02-25 DIAGNOSIS — R339 Retention of urine, unspecified: Secondary | ICD-10-CM | POA: Diagnosis not present

## 2014-02-25 DIAGNOSIS — F0390 Unspecified dementia without behavioral disturbance: Secondary | ICD-10-CM | POA: Diagnosis not present

## 2014-02-25 DIAGNOSIS — N390 Urinary tract infection, site not specified: Secondary | ICD-10-CM | POA: Diagnosis not present

## 2014-02-25 DIAGNOSIS — S3289XA Fracture of other parts of pelvis, initial encounter for closed fracture: Secondary | ICD-10-CM | POA: Diagnosis not present

## 2014-03-01 DIAGNOSIS — G20 Parkinson's disease: Secondary | ICD-10-CM | POA: Diagnosis not present

## 2014-03-01 DIAGNOSIS — R339 Retention of urine, unspecified: Secondary | ICD-10-CM | POA: Diagnosis not present

## 2014-03-01 DIAGNOSIS — F329 Major depressive disorder, single episode, unspecified: Secondary | ICD-10-CM | POA: Diagnosis not present

## 2014-03-01 DIAGNOSIS — N390 Urinary tract infection, site not specified: Secondary | ICD-10-CM | POA: Diagnosis not present

## 2014-03-01 DIAGNOSIS — S3289XA Fracture of other parts of pelvis, initial encounter for closed fracture: Secondary | ICD-10-CM | POA: Diagnosis not present

## 2014-03-01 DIAGNOSIS — F0390 Unspecified dementia without behavioral disturbance: Secondary | ICD-10-CM | POA: Diagnosis not present

## 2014-03-06 DIAGNOSIS — M6281 Muscle weakness (generalized): Secondary | ICD-10-CM | POA: Diagnosis not present

## 2014-03-06 DIAGNOSIS — N390 Urinary tract infection, site not specified: Secondary | ICD-10-CM | POA: Diagnosis not present

## 2014-03-06 DIAGNOSIS — F0390 Unspecified dementia without behavioral disturbance: Secondary | ICD-10-CM | POA: Diagnosis not present

## 2014-03-06 DIAGNOSIS — G20 Parkinson's disease: Secondary | ICD-10-CM | POA: Diagnosis not present

## 2014-03-06 DIAGNOSIS — F329 Major depressive disorder, single episode, unspecified: Secondary | ICD-10-CM | POA: Diagnosis not present

## 2014-03-06 DIAGNOSIS — G219 Secondary parkinsonism, unspecified: Secondary | ICD-10-CM | POA: Diagnosis not present

## 2014-03-06 DIAGNOSIS — R339 Retention of urine, unspecified: Secondary | ICD-10-CM | POA: Diagnosis not present

## 2014-03-06 DIAGNOSIS — F411 Generalized anxiety disorder: Secondary | ICD-10-CM | POA: Diagnosis not present

## 2014-03-06 DIAGNOSIS — F22 Delusional disorders: Secondary | ICD-10-CM | POA: Diagnosis not present

## 2014-03-06 DIAGNOSIS — S338XXA Sprain of other parts of lumbar spine and pelvis, initial encounter: Secondary | ICD-10-CM | POA: Diagnosis not present

## 2014-03-06 DIAGNOSIS — S3289XA Fracture of other parts of pelvis, initial encounter for closed fracture: Secondary | ICD-10-CM | POA: Diagnosis not present

## 2014-03-08 DIAGNOSIS — F329 Major depressive disorder, single episode, unspecified: Secondary | ICD-10-CM | POA: Diagnosis not present

## 2014-03-08 DIAGNOSIS — G20 Parkinson's disease: Secondary | ICD-10-CM | POA: Diagnosis not present

## 2014-03-08 DIAGNOSIS — F0390 Unspecified dementia without behavioral disturbance: Secondary | ICD-10-CM | POA: Diagnosis not present

## 2014-03-08 DIAGNOSIS — N390 Urinary tract infection, site not specified: Secondary | ICD-10-CM | POA: Diagnosis not present

## 2014-03-08 DIAGNOSIS — R339 Retention of urine, unspecified: Secondary | ICD-10-CM | POA: Diagnosis not present

## 2014-03-08 DIAGNOSIS — S3289XA Fracture of other parts of pelvis, initial encounter for closed fracture: Secondary | ICD-10-CM | POA: Diagnosis not present

## 2014-03-12 DIAGNOSIS — G20 Parkinson's disease: Secondary | ICD-10-CM | POA: Diagnosis not present

## 2014-03-12 DIAGNOSIS — F329 Major depressive disorder, single episode, unspecified: Secondary | ICD-10-CM | POA: Diagnosis not present

## 2014-03-12 DIAGNOSIS — R339 Retention of urine, unspecified: Secondary | ICD-10-CM | POA: Diagnosis not present

## 2014-03-12 DIAGNOSIS — F0390 Unspecified dementia without behavioral disturbance: Secondary | ICD-10-CM | POA: Diagnosis not present

## 2014-03-12 DIAGNOSIS — S3289XA Fracture of other parts of pelvis, initial encounter for closed fracture: Secondary | ICD-10-CM | POA: Diagnosis not present

## 2014-03-12 DIAGNOSIS — N390 Urinary tract infection, site not specified: Secondary | ICD-10-CM | POA: Diagnosis not present

## 2014-03-15 DIAGNOSIS — S3289XA Fracture of other parts of pelvis, initial encounter for closed fracture: Secondary | ICD-10-CM | POA: Diagnosis not present

## 2014-03-15 DIAGNOSIS — F0390 Unspecified dementia without behavioral disturbance: Secondary | ICD-10-CM | POA: Diagnosis not present

## 2014-03-15 DIAGNOSIS — N390 Urinary tract infection, site not specified: Secondary | ICD-10-CM | POA: Diagnosis not present

## 2014-03-15 DIAGNOSIS — R339 Retention of urine, unspecified: Secondary | ICD-10-CM | POA: Diagnosis not present

## 2014-03-15 DIAGNOSIS — F329 Major depressive disorder, single episode, unspecified: Secondary | ICD-10-CM | POA: Diagnosis not present

## 2014-03-15 DIAGNOSIS — G20 Parkinson's disease: Secondary | ICD-10-CM | POA: Diagnosis not present

## 2014-03-16 DIAGNOSIS — F329 Major depressive disorder, single episode, unspecified: Secondary | ICD-10-CM | POA: Diagnosis not present

## 2014-03-16 DIAGNOSIS — G20 Parkinson's disease: Secondary | ICD-10-CM | POA: Diagnosis not present

## 2014-03-16 DIAGNOSIS — Z8781 Personal history of (healed) traumatic fracture: Secondary | ICD-10-CM | POA: Diagnosis not present

## 2014-03-16 DIAGNOSIS — F419 Anxiety disorder, unspecified: Secondary | ICD-10-CM | POA: Diagnosis not present

## 2014-03-16 DIAGNOSIS — F0390 Unspecified dementia without behavioral disturbance: Secondary | ICD-10-CM | POA: Diagnosis not present

## 2014-03-16 DIAGNOSIS — Z87440 Personal history of urinary (tract) infections: Secondary | ICD-10-CM | POA: Diagnosis not present

## 2014-03-19 DIAGNOSIS — Z8781 Personal history of (healed) traumatic fracture: Secondary | ICD-10-CM | POA: Diagnosis not present

## 2014-03-19 DIAGNOSIS — F329 Major depressive disorder, single episode, unspecified: Secondary | ICD-10-CM | POA: Diagnosis not present

## 2014-03-19 DIAGNOSIS — Z87440 Personal history of urinary (tract) infections: Secondary | ICD-10-CM | POA: Diagnosis not present

## 2014-03-19 DIAGNOSIS — F0390 Unspecified dementia without behavioral disturbance: Secondary | ICD-10-CM | POA: Diagnosis not present

## 2014-03-19 DIAGNOSIS — G20 Parkinson's disease: Secondary | ICD-10-CM | POA: Diagnosis not present

## 2014-03-19 DIAGNOSIS — F419 Anxiety disorder, unspecified: Secondary | ICD-10-CM | POA: Diagnosis not present

## 2014-03-20 DIAGNOSIS — G20 Parkinson's disease: Secondary | ICD-10-CM | POA: Diagnosis not present

## 2014-03-20 DIAGNOSIS — Z79899 Other long term (current) drug therapy: Secondary | ICD-10-CM | POA: Diagnosis not present

## 2014-03-20 DIAGNOSIS — F331 Major depressive disorder, recurrent, moderate: Secondary | ICD-10-CM | POA: Diagnosis not present

## 2014-03-20 DIAGNOSIS — F0280 Dementia in other diseases classified elsewhere without behavioral disturbance: Secondary | ICD-10-CM | POA: Diagnosis not present

## 2014-03-22 DIAGNOSIS — F329 Major depressive disorder, single episode, unspecified: Secondary | ICD-10-CM | POA: Diagnosis not present

## 2014-03-22 DIAGNOSIS — F0390 Unspecified dementia without behavioral disturbance: Secondary | ICD-10-CM | POA: Diagnosis not present

## 2014-03-22 DIAGNOSIS — Z87440 Personal history of urinary (tract) infections: Secondary | ICD-10-CM | POA: Diagnosis not present

## 2014-03-22 DIAGNOSIS — G20 Parkinson's disease: Secondary | ICD-10-CM | POA: Diagnosis not present

## 2014-03-22 DIAGNOSIS — F419 Anxiety disorder, unspecified: Secondary | ICD-10-CM | POA: Diagnosis not present

## 2014-03-22 DIAGNOSIS — Z8781 Personal history of (healed) traumatic fracture: Secondary | ICD-10-CM | POA: Diagnosis not present

## 2014-03-26 DIAGNOSIS — Z87440 Personal history of urinary (tract) infections: Secondary | ICD-10-CM | POA: Diagnosis not present

## 2014-03-26 DIAGNOSIS — Z8781 Personal history of (healed) traumatic fracture: Secondary | ICD-10-CM | POA: Diagnosis not present

## 2014-03-26 DIAGNOSIS — F0390 Unspecified dementia without behavioral disturbance: Secondary | ICD-10-CM | POA: Diagnosis not present

## 2014-03-26 DIAGNOSIS — F419 Anxiety disorder, unspecified: Secondary | ICD-10-CM | POA: Diagnosis not present

## 2014-03-26 DIAGNOSIS — G20 Parkinson's disease: Secondary | ICD-10-CM | POA: Diagnosis not present

## 2014-03-26 DIAGNOSIS — F329 Major depressive disorder, single episode, unspecified: Secondary | ICD-10-CM | POA: Diagnosis not present

## 2014-03-29 DIAGNOSIS — F419 Anxiety disorder, unspecified: Secondary | ICD-10-CM | POA: Diagnosis not present

## 2014-03-29 DIAGNOSIS — F0390 Unspecified dementia without behavioral disturbance: Secondary | ICD-10-CM | POA: Diagnosis not present

## 2014-03-29 DIAGNOSIS — Z8781 Personal history of (healed) traumatic fracture: Secondary | ICD-10-CM | POA: Diagnosis not present

## 2014-03-29 DIAGNOSIS — F329 Major depressive disorder, single episode, unspecified: Secondary | ICD-10-CM | POA: Diagnosis not present

## 2014-03-29 DIAGNOSIS — G20 Parkinson's disease: Secondary | ICD-10-CM | POA: Diagnosis not present

## 2014-03-29 DIAGNOSIS — Z87440 Personal history of urinary (tract) infections: Secondary | ICD-10-CM | POA: Diagnosis not present

## 2014-04-02 DIAGNOSIS — G20 Parkinson's disease: Secondary | ICD-10-CM | POA: Diagnosis not present

## 2014-04-02 DIAGNOSIS — Z8781 Personal history of (healed) traumatic fracture: Secondary | ICD-10-CM | POA: Diagnosis not present

## 2014-04-02 DIAGNOSIS — F419 Anxiety disorder, unspecified: Secondary | ICD-10-CM | POA: Diagnosis not present

## 2014-04-02 DIAGNOSIS — Z87440 Personal history of urinary (tract) infections: Secondary | ICD-10-CM | POA: Diagnosis not present

## 2014-04-02 DIAGNOSIS — F0390 Unspecified dementia without behavioral disturbance: Secondary | ICD-10-CM | POA: Diagnosis not present

## 2014-04-02 DIAGNOSIS — F329 Major depressive disorder, single episode, unspecified: Secondary | ICD-10-CM | POA: Diagnosis not present

## 2014-04-03 DIAGNOSIS — Z961 Presence of intraocular lens: Secondary | ICD-10-CM | POA: Diagnosis not present

## 2014-04-06 DIAGNOSIS — Z87440 Personal history of urinary (tract) infections: Secondary | ICD-10-CM | POA: Diagnosis not present

## 2014-04-06 DIAGNOSIS — Z8781 Personal history of (healed) traumatic fracture: Secondary | ICD-10-CM | POA: Diagnosis not present

## 2014-04-06 DIAGNOSIS — G20 Parkinson's disease: Secondary | ICD-10-CM | POA: Diagnosis not present

## 2014-04-06 DIAGNOSIS — F329 Major depressive disorder, single episode, unspecified: Secondary | ICD-10-CM | POA: Diagnosis not present

## 2014-04-06 DIAGNOSIS — F419 Anxiety disorder, unspecified: Secondary | ICD-10-CM | POA: Diagnosis not present

## 2014-04-06 DIAGNOSIS — F0390 Unspecified dementia without behavioral disturbance: Secondary | ICD-10-CM | POA: Diagnosis not present

## 2014-04-08 ENCOUNTER — Encounter: Payer: Self-pay | Admitting: Neurology

## 2014-04-09 DIAGNOSIS — F329 Major depressive disorder, single episode, unspecified: Secondary | ICD-10-CM | POA: Diagnosis not present

## 2014-04-09 DIAGNOSIS — Z8781 Personal history of (healed) traumatic fracture: Secondary | ICD-10-CM | POA: Diagnosis not present

## 2014-04-09 DIAGNOSIS — Z87440 Personal history of urinary (tract) infections: Secondary | ICD-10-CM | POA: Diagnosis not present

## 2014-04-09 DIAGNOSIS — F419 Anxiety disorder, unspecified: Secondary | ICD-10-CM | POA: Diagnosis not present

## 2014-04-09 DIAGNOSIS — F0390 Unspecified dementia without behavioral disturbance: Secondary | ICD-10-CM | POA: Diagnosis not present

## 2014-04-09 DIAGNOSIS — G20 Parkinson's disease: Secondary | ICD-10-CM | POA: Diagnosis not present

## 2014-04-12 DIAGNOSIS — G20 Parkinson's disease: Secondary | ICD-10-CM | POA: Diagnosis not present

## 2014-04-12 DIAGNOSIS — F419 Anxiety disorder, unspecified: Secondary | ICD-10-CM | POA: Diagnosis not present

## 2014-04-12 DIAGNOSIS — Z8781 Personal history of (healed) traumatic fracture: Secondary | ICD-10-CM | POA: Diagnosis not present

## 2014-04-12 DIAGNOSIS — F0390 Unspecified dementia without behavioral disturbance: Secondary | ICD-10-CM | POA: Diagnosis not present

## 2014-04-12 DIAGNOSIS — F329 Major depressive disorder, single episode, unspecified: Secondary | ICD-10-CM | POA: Diagnosis not present

## 2014-04-12 DIAGNOSIS — Z87440 Personal history of urinary (tract) infections: Secondary | ICD-10-CM | POA: Diagnosis not present

## 2014-04-17 DIAGNOSIS — Z8781 Personal history of (healed) traumatic fracture: Secondary | ICD-10-CM | POA: Diagnosis not present

## 2014-04-17 DIAGNOSIS — Z87440 Personal history of urinary (tract) infections: Secondary | ICD-10-CM | POA: Diagnosis not present

## 2014-04-17 DIAGNOSIS — F329 Major depressive disorder, single episode, unspecified: Secondary | ICD-10-CM | POA: Diagnosis not present

## 2014-04-17 DIAGNOSIS — F419 Anxiety disorder, unspecified: Secondary | ICD-10-CM | POA: Diagnosis not present

## 2014-04-17 DIAGNOSIS — F0390 Unspecified dementia without behavioral disturbance: Secondary | ICD-10-CM | POA: Diagnosis not present

## 2014-04-17 DIAGNOSIS — G20 Parkinson's disease: Secondary | ICD-10-CM | POA: Diagnosis not present

## 2014-04-24 ENCOUNTER — Encounter: Payer: Self-pay | Admitting: Neurology

## 2014-04-24 DIAGNOSIS — Z87440 Personal history of urinary (tract) infections: Secondary | ICD-10-CM | POA: Diagnosis not present

## 2014-04-24 DIAGNOSIS — G20 Parkinson's disease: Secondary | ICD-10-CM | POA: Diagnosis not present

## 2014-04-24 DIAGNOSIS — F419 Anxiety disorder, unspecified: Secondary | ICD-10-CM | POA: Diagnosis not present

## 2014-04-24 DIAGNOSIS — Z8781 Personal history of (healed) traumatic fracture: Secondary | ICD-10-CM | POA: Diagnosis not present

## 2014-04-24 DIAGNOSIS — F0390 Unspecified dementia without behavioral disturbance: Secondary | ICD-10-CM | POA: Diagnosis not present

## 2014-04-24 DIAGNOSIS — F329 Major depressive disorder, single episode, unspecified: Secondary | ICD-10-CM | POA: Diagnosis not present

## 2014-04-29 DIAGNOSIS — Z8781 Personal history of (healed) traumatic fracture: Secondary | ICD-10-CM | POA: Diagnosis not present

## 2014-04-29 DIAGNOSIS — Z87440 Personal history of urinary (tract) infections: Secondary | ICD-10-CM | POA: Diagnosis not present

## 2014-04-29 DIAGNOSIS — G20 Parkinson's disease: Secondary | ICD-10-CM | POA: Diagnosis not present

## 2014-04-29 DIAGNOSIS — F329 Major depressive disorder, single episode, unspecified: Secondary | ICD-10-CM | POA: Diagnosis not present

## 2014-04-29 DIAGNOSIS — F0390 Unspecified dementia without behavioral disturbance: Secondary | ICD-10-CM | POA: Diagnosis not present

## 2014-04-29 DIAGNOSIS — F419 Anxiety disorder, unspecified: Secondary | ICD-10-CM | POA: Diagnosis not present

## 2014-04-30 ENCOUNTER — Encounter: Payer: Self-pay | Admitting: Neurology

## 2014-05-10 ENCOUNTER — Ambulatory Visit: Payer: Medicare Other | Admitting: Neurology

## 2014-05-10 ENCOUNTER — Encounter: Payer: Self-pay | Admitting: Neurology

## 2014-05-10 ENCOUNTER — Ambulatory Visit (INDEPENDENT_AMBULATORY_CARE_PROVIDER_SITE_OTHER): Payer: Medicare Other | Admitting: Neurology

## 2014-05-10 VITALS — BP 154/78 | HR 77

## 2014-05-10 DIAGNOSIS — R413 Other amnesia: Secondary | ICD-10-CM

## 2014-05-10 DIAGNOSIS — R269 Unspecified abnormalities of gait and mobility: Secondary | ICD-10-CM

## 2014-05-10 DIAGNOSIS — G20 Parkinson's disease: Secondary | ICD-10-CM | POA: Diagnosis not present

## 2014-05-10 DIAGNOSIS — F0280 Dementia in other diseases classified elsewhere without behavioral disturbance: Secondary | ICD-10-CM | POA: Diagnosis not present

## 2014-05-10 MED ORDER — CARBIDOPA-LEVODOPA 25-100 MG PO TABS: 1.0000 | ORAL_TABLET | Freq: Two times a day (BID) | ORAL | Status: DC

## 2014-05-10 MED ORDER — MIRTAZAPINE 15 MG PO TABS: 15.0000 mg | ORAL_TABLET | Freq: Every day | ORAL | Status: DC

## 2014-05-10 NOTE — Progress Notes (Signed)
Reason for visit: Parkinson's disease  [name] Oliver is an 80 y.o. female  History of present illness:  [name] Oliver is a 80 year old right-handed white female with Parkinson's disease and a mild dementia. The patient is on low-dose Sinemet taking 25/100 mg tablets twice daily. She was switched off of trazodone to Remeron for sleep at night, and within 48 hours, she had a dramatic improvement in her cognitive functioning levels. She has done quite well with her activity, and she is walking daily. She is back living at home, and she is operating motor vehicle. She is sleeping well at night, and she is tolerating the medications well. She returns for an evaluation. She has not wanted to go on medications for memory in the past.  Past Medical History  Diagnosis Date  . Chronic cough   . Hip fracture   . Pelvis fracture   . Memory difficulty 01/08/2014  . Primary Parkinsonism     Past Surgical History  Procedure Laterality Date  . Fracture surgery    . Abdominal hysterectomy    . Joint replacement    . Cataract extraction Bilateral     03/14/13  . Breast lumpectomy Right   . Abdominal hysterectomy    . Total hip arthroplasty Right     Family History  Problem Relation Age of Onset  . Diabetes Other   . Dementia Mother   . Bipolar disorder Sister   . Parkinsonism Sister     Social history:  reports that she has never smoked. She has never used smokeless tobacco. She reports that she does not drink alcohol or use illicit drugs.    Allergies  Allergen Reactions  . Morphine And Related Other (See Comments)    Hallucinations  . Prednisone Other (See Comments)    manic    Medications:  Current Outpatient Prescriptions on File Prior to Visit  Medication Sig Dispense Refill  . Cranberry Fruit 475 MG CAPS Take 1 capsule by mouth 2 (two) times daily.    . magnesium hydroxide (MILK OF MAGNESIA) 400 MG/5ML suspension Take 30 mLs by mouth daily.     No current facility-administered  medications on file prior to visit.    ROS:  Out of a complete 14 system review of symptoms, the patient complains only of the following symptoms, and all other reviewed systems are negative.  Cough Incontinence of the bladder Memory loss Confusion, anxiety  Blood pressure 154/78, pulse 77.  Physical Exam  General: The patient is alert and cooperative at the time of the examination.  Skin: No significant peripheral edema is noted.   Neurologic Exam  Mental status: The Mini-Mental Status Examination done today shows a total score 26/30.  Cranial nerves: Facial symmetry is present. Speech is low amplitude, dysphonic. Extraocular movements are full. Visual fields are full. Masking of the face is seen.  Motor: The patient has good strength in all 4 extremities.  Sensory examination: Soft touch sensation is symmetric on the face, arms, and legs.  Coordination: The patient has good finger-nose-finger and heel-to-shin bilaterally.  Gait and station: The patient is able to arise from a seated position crossed with some difficulty. With walking, there is some decreased arm swing, left greater than right. Tandem gait is slightly unsteady. Romberg is negative. No drift is seen.  Reflexes: Deep tendon reflexes are symmetric.   Assessment/Plan:  1. Parkinson's disease  2. Memory disorder  The patient does not wish to go on medications for memory. She is doing  quite well on the Sinemet and the Remeron currently, she will follow-up in 5 months. The family is to contact me if concerns arise.  Marlan Palau. Keith Willis MD 05/11/2014 10:51 AM  [facility] [address] [place], [place] [address]  Phone [phone] Fax [phone]

## 2014-05-10 NOTE — Patient Instructions (Signed)
Parkinson Disease Parkinson disease is a disorder of the central nervous system, which includes the brain and spinal cord. A person with this disease slowly loses the ability to completely control body movements. Within the brain, there is a group of nerve cells (basal ganglia) that help control movement. The basal ganglia are damaged and do not work properly in a person with Parkinson disease. In addition, the basal ganglia produce and use a brain chemical called dopamine. The dopamine chemical sends messages to other parts of the body to control and coordinate body movements. Dopamine levels are low in a person with Parkinson disease. If the dopamine levels are low, then the body does not receive the correct messages it needs to move normally.  CAUSES  The exact reason why the basal ganglia get damaged is not known. Some medical researchers have thought that infection, genes, environment, and certain medicines may contribute to the cause.  SYMPTOMS   An early symptom of Parkinson disease is often an uncontrolled shaking (tremor) of the hands. The tremor will often disappear when the affected hand is consciously used.  As the disease progresses, walking, talking, getting out of a chair, and new movements become more difficult.  Muscles get stiff and movements become slower.  Balance and coordination become harder.  Depression, trouble swallowing, urinary problems, constipation, and sleep problems can occur.  Later in the disease, memory and thought processes may deteriorate. DIAGNOSIS  There are no specific tests to diagnose Parkinson disease. You may be referred to a neurologist for evaluation. Your caregiver will ask about your medical history, symptoms, and perform a physical exam. Blood tests and imaging tests of your brain may be performed to rule out other diseases. The imaging tests may include an MRI or a CT scan. TREATMENT  The goal of treatment is to relieve symptoms. Medicines may be  prescribed once the symptoms become troublesome. Medicine will not stop the progression of the disease, but medicine can make movement and balance better and help control tremors. Speech and occupational therapy may also be prescribed. Sometimes, surgical treatment of the brain can be done in young people. HOME CARE INSTRUCTIONS  Get regular exercise and rest periods during the day to help prevent exhaustion and depression.  If getting dressed becomes difficult, replace buttons and zippers with Velcro and elastic on your clothing.  Take all medicine as directed by your caregiver.  Install grab bars or railings in your home to prevent falls.  Go to speech or occupational therapy as directed.  Keep all follow-up visits as directed by your caregiver. SEEK MEDICAL CARE IF:  Your symptoms are not controlled with your medicine.  You fall.  You have trouble swallowing or choke on your food. MAKE SURE YOU:  Understand these instructions.  Will watch your condition.  Will get help right away if you are not doing well or get worse. Document Released: 05/21/2000 Document Revised: 09/18/2012 Document Reviewed: 06/23/2011 ExitCare Patient Information 2015 [employer]. This information is not intended to replace advice given to you by your health care provider. Make sure you discuss any questions you have with your health care provider.  

## 2014-08-16 DIAGNOSIS — I781 Nevus, non-neoplastic: Secondary | ICD-10-CM | POA: Diagnosis not present

## 2014-08-16 DIAGNOSIS — Z6823 Body mass index (BMI) 23.0-23.9, adult: Secondary | ICD-10-CM | POA: Diagnosis not present

## 2014-09-23 DIAGNOSIS — Z1231 Encounter for screening mammogram for malignant neoplasm of breast: Secondary | ICD-10-CM | POA: Diagnosis not present

## 2014-09-30 DIAGNOSIS — G20 Parkinson's disease: Secondary | ICD-10-CM | POA: Diagnosis not present

## 2014-09-30 DIAGNOSIS — Z79899 Other long term (current) drug therapy: Secondary | ICD-10-CM | POA: Diagnosis not present

## 2014-09-30 DIAGNOSIS — F0280 Dementia in other diseases classified elsewhere without behavioral disturbance: Secondary | ICD-10-CM | POA: Diagnosis not present

## 2014-09-30 DIAGNOSIS — Z0000 Encounter for general adult medical examination without abnormal findings: Secondary | ICD-10-CM | POA: Diagnosis not present

## 2014-10-01 DIAGNOSIS — F0390 Unspecified dementia without behavioral disturbance: Secondary | ICD-10-CM | POA: Diagnosis not present

## 2014-10-01 DIAGNOSIS — E440 Moderate protein-calorie malnutrition: Secondary | ICD-10-CM | POA: Diagnosis not present

## 2014-10-01 DIAGNOSIS — G20 Parkinson's disease: Secondary | ICD-10-CM | POA: Diagnosis not present

## 2014-10-02 DIAGNOSIS — G20 Parkinson's disease: Secondary | ICD-10-CM | POA: Diagnosis not present

## 2014-10-09 ENCOUNTER — Ambulatory Visit (INDEPENDENT_AMBULATORY_CARE_PROVIDER_SITE_OTHER): Payer: Medicare Other | Admitting: Neurology

## 2014-10-09 ENCOUNTER — Encounter: Payer: Self-pay | Admitting: Neurology

## 2014-10-09 VITALS — BP 131/74 | HR 81 | Ht 64.0 in | Wt 127.0 lb

## 2014-10-09 DIAGNOSIS — R413 Other amnesia: Secondary | ICD-10-CM | POA: Diagnosis not present

## 2014-10-09 DIAGNOSIS — G20 Parkinson's disease: Secondary | ICD-10-CM | POA: Diagnosis not present

## 2014-10-09 DIAGNOSIS — R269 Unspecified abnormalities of gait and mobility: Secondary | ICD-10-CM | POA: Diagnosis not present

## 2014-10-09 MED ORDER — CARBIDOPA-LEVODOPA 25-100 MG PO TABS: 1.0000 | ORAL_TABLET | Freq: Two times a day (BID) | ORAL | Status: DC

## 2014-10-09 NOTE — Patient Instructions (Signed)
Parkinson Disease Parkinson disease is a disorder of the central nervous system, which includes the brain and spinal cord. A person with this disease slowly loses the ability to completely control body movements. Within the brain, there is a group of nerve cells (basal ganglia) that help control movement. The basal ganglia are damaged and do not work properly in a person with Parkinson disease. In addition, the basal ganglia produce and use a brain chemical called dopamine. The dopamine chemical sends messages to other parts of the body to control and coordinate body movements. Dopamine levels are low in a person with Parkinson disease. If the dopamine levels are low, then the body does not receive the correct messages it needs to move normally.  CAUSES  The exact reason why the basal ganglia get damaged is not known. Some medical researchers have thought that infection, genes, environment, and certain medicines may contribute to the cause.  SYMPTOMS   An early symptom of Parkinson disease is often an uncontrolled shaking (tremor) of the hands. The tremor will often disappear when the affected hand is consciously used.  As the disease progresses, walking, talking, getting out of a chair, and new movements become more difficult.  Muscles get stiff and movements become slower.  Balance and coordination become harder.  Depression, trouble swallowing, urinary problems, constipation, and sleep problems can occur.  Later in the disease, memory and thought processes may deteriorate. DIAGNOSIS  There are no specific tests to diagnose Parkinson disease. You may be referred to a neurologist for evaluation. Your caregiver will ask about your medical history, symptoms, and perform a physical exam. Blood tests and imaging tests of your brain may be performed to rule out other diseases. The imaging tests may include an MRI or a CT scan. TREATMENT  The goal of treatment is to relieve symptoms. Medicines may be  prescribed once the symptoms become troublesome. Medicine will not stop the progression of the disease, but medicine can make movement and balance better and help control tremors. Speech and occupational therapy may also be prescribed. Sometimes, surgical treatment of the brain can be done in young people. HOME CARE INSTRUCTIONS  Get regular exercise and rest periods during the day to help prevent exhaustion and depression.  If getting dressed becomes difficult, replace buttons and zippers with Velcro and elastic on your clothing.  Take all medicine as directed by your caregiver.  Install grab bars or railings in your home to prevent falls.  Go to speech or occupational therapy as directed.  Keep all follow-up visits as directed by your caregiver. SEEK MEDICAL CARE IF:  Your symptoms are not controlled with your medicine.  You fall.  You have trouble swallowing or choke on your food. MAKE SURE YOU:  Understand these instructions.  Will watch your condition.  Will get help right away if you are not doing well or get worse. Document Released: 05/21/2000 Document Revised: 09/18/2012 Document Reviewed: 06/23/2011 ExitCare Patient Information 2015 [employer]. This information is not intended to replace advice given to you by your health care provider. Make sure you discuss any questions you have with your health care provider.  

## 2014-10-09 NOTE — Progress Notes (Signed)
Reason for visit: Parkinsonism  [name] Oliver is an 81 y.o. female  History of present illness:  [name] Oliver is an 81 year old right-handed white female with a history of parkinsonism. The patient was placed on low-dose Sinemet taking the 25/100 mg tablets twice daily. She believes that she gained a significant benefit with the medication, and this benefit has been maintained. If anything, the family believes that her walking abilities and mobility is better. The patient has not had any falls, she does not use a cane or walker for ambulation. She is trying to stay active with walking, doing yard work. She does have some fatigue, lack of animation in the face. Her posture is somewhat stooped. She is sleeping fairly well at night on the Remeron, and this has also helped the depression issues. She has not had any significant problems with anxiety. She returns to the office today for an evaluation. Her memory if anything has improved over time, she is not on any medications for memory.  Past Medical History  Diagnosis Date  . Chronic cough   . Hip fracture   . Pelvis fracture   . Memory difficulty 01/08/2014  . Primary Parkinsonism     Past Surgical History  Procedure Laterality Date  . Fracture surgery    . Abdominal hysterectomy    . Joint replacement    . Cataract extraction Bilateral     03/14/13  . Breast lumpectomy Right   . Abdominal hysterectomy    . Total hip arthroplasty Right     Family History  Problem Relation Age of Onset  . Diabetes Other   . Dementia Mother   . Bipolar disorder Sister   . Parkinsonism Sister     Social history:  reports that she has never smoked. She has never used smokeless tobacco. She reports that she does not drink alcohol or use illicit drugs.    Allergies  Allergen Reactions  . Morphine And Related Other (See Comments)    Hallucinations  . Prednisone Other (See Comments)    manic    Medications:  Prior to Admission medications     Medication Sig Start Date End Date Taking? Authorizing Provider  carbidopa-levodopa (SINEMET IR) 25-100 MG per tablet Take 1 tablet by mouth 2 (two) times daily. 05/10/14  Yes York Spanielharles K Oline Belk, MD  Cranberry Fruit 475 MG CAPS Take 1 capsule by mouth 2 (two) times daily.   Yes Historical Provider, MD  mirtazapine (REMERON) 15 MG tablet Take 1 tablet (15 mg total) by mouth at bedtime. 05/10/14  Yes York Spanielharles K Addalie Calles, MD    ROS:  Out of a complete 14 system review of symptoms, the patient complains only of the following symptoms, and all other reviewed systems are negative.  Fatigue Memory loss Back pain  Blood pressure 131/74, pulse 81, height 5\' 4"  (1.626 m), weight 127 lb (57.607 kg).  Physical Exam  General: The patient is alert and cooperative at the time of the examination.  Skin: No significant peripheral edema is noted.   Neurologic Exam  Mental status: The patient is alert and oriented x 3 at the time of the examination. The patient has apparent normal recent and remote memory, with an apparently normal attention span and concentration ability. Mini-Mental Status Examination done today shows a total score 29/30.   Cranial nerves: Facial symmetry is present. Speech is normal, no aphasia or dysarthria is noted. Extraocular movements are full. Visual fields are full. Mild masking of the face is seen.  Motor: The patient has good strength in all 4 extremities.  Sensory examination: Soft touch sensation is symmetric on the face, arms, and legs.  Coordination: The patient has good finger-nose-finger and heel-to-shin bilaterally.  Gait and station: The patient is able to arise from a seated position with arms crossed. Once up, the patient ambulates independently, with slightly stooped posture. There is a slight decrease in arm swing bilaterally. Tandem gait is normal. Romberg is negative. No drift is seen.  Reflexes: Deep tendon reflexes are symmetric.   Assessment/Plan:  1.  Parkinsonism  2. Mild memory disturbance  The memory issues appear to have improved as time has gone on. The patient is to remain active. We will continue the Sinemet in low dose taking 25/100 mg tablets twice daily. The patient will follow-up in 6 months. She has had recent blood work done that includes a normal chemistry panel, liver panel, CBC. The patient has also had a cholesterol panel. The patient will contact our office if she has any concerns.  Marlan Palau. Keith Rickeya Manus MD 10/09/2014 8:25 PM  [facility] [address] [place], [place] [address]  Phone [phone] Fax [phone]

## 2014-12-20 ENCOUNTER — Other Ambulatory Visit: Payer: Self-pay | Admitting: Neurology

## 2014-12-20 NOTE — Telephone Encounter (Signed)
Prescribed at [facility] in Dec

## 2015-02-03 ENCOUNTER — Telehealth: Payer: Self-pay | Admitting: Neurology

## 2015-02-03 MED ORDER — MIRTAZAPINE 30 MG PO TABS: 30.0000 mg | ORAL_TABLET | Freq: Every day | ORAL | Status: DC

## 2015-02-03 NOTE — Telephone Encounter (Signed)
Patient's daughter Marcelle Smiling is calling and states she has seen significant changes in her mother. She has noticed increase anxiety, memory decline, more overall depression, and sleeping more.  She is not sure if this if the parkinson or her Rx mirtazapine 15 mg causing this.  Please call to discuss.  Thanks!

## 2015-02-03 NOTE — Telephone Encounter (Signed)
I called the daughter. The daughter had done well for about a year on 15 mg Remeron at night. This seemed to help her symptoms of depression. She seems more depressed now, sleeps throughout the day, says that she feels bad all the time. Cognitive functioning seems to have declined. I will increase the Remeron taking 30 milligrams at night, and this significantly worsens the drowsiness, we may switch to another medication.

## 2015-02-03 NOTE — Telephone Encounter (Signed)
I called the patient's daughter, Marcelle Smiling. She states that she has noticed over the summer that her mother has gradually become more confused. She has also become very anxious. Her balance is still good. She has more of a flat affect and she always "feels bad." Marcelle Smiling stated that the Remeron really helped her mother with these symptoms last year when she started it. She wondered if the dosage could be increased.

## 2015-04-02 DIAGNOSIS — I781 Nevus, non-neoplastic: Secondary | ICD-10-CM | POA: Diagnosis not present

## 2015-04-02 DIAGNOSIS — F331 Major depressive disorder, recurrent, moderate: Secondary | ICD-10-CM | POA: Diagnosis not present

## 2015-04-02 DIAGNOSIS — G20 Parkinson's disease: Secondary | ICD-10-CM | POA: Diagnosis not present

## 2015-04-02 DIAGNOSIS — Z79899 Other long term (current) drug therapy: Secondary | ICD-10-CM | POA: Diagnosis not present

## 2015-04-02 DIAGNOSIS — Z6823 Body mass index (BMI) 23.0-23.9, adult: Secondary | ICD-10-CM | POA: Diagnosis not present

## 2015-04-02 DIAGNOSIS — Z13220 Encounter for screening for lipoid disorders: Secondary | ICD-10-CM | POA: Diagnosis not present

## 2015-04-04 DIAGNOSIS — E46 Unspecified protein-calorie malnutrition: Secondary | ICD-10-CM | POA: Diagnosis not present

## 2015-04-04 DIAGNOSIS — G20 Parkinson's disease: Secondary | ICD-10-CM | POA: Diagnosis not present

## 2015-04-04 DIAGNOSIS — Z23 Encounter for immunization: Secondary | ICD-10-CM | POA: Diagnosis not present

## 2015-04-22 ENCOUNTER — Ambulatory Visit (INDEPENDENT_AMBULATORY_CARE_PROVIDER_SITE_OTHER): Payer: Medicare Other | Admitting: Neurology

## 2015-04-22 ENCOUNTER — Encounter: Payer: Self-pay | Admitting: Neurology

## 2015-04-22 VITALS — BP 154/82 | HR 87 | Ht 65.0 in | Wt 124.0 lb

## 2015-04-22 DIAGNOSIS — R269 Unspecified abnormalities of gait and mobility: Secondary | ICD-10-CM | POA: Diagnosis not present

## 2015-04-22 DIAGNOSIS — R413 Other amnesia: Secondary | ICD-10-CM

## 2015-04-22 DIAGNOSIS — G219 Secondary parkinsonism, unspecified: Secondary | ICD-10-CM | POA: Diagnosis not present

## 2015-04-22 MED ORDER — CARBIDOPA-LEVODOPA 25-100 MG PO TABS: 1.0000 | ORAL_TABLET | Freq: Two times a day (BID) | ORAL | Status: DC

## 2015-04-22 MED ORDER — MIRTAZAPINE 30 MG PO TABS: 30.0000 mg | ORAL_TABLET | Freq: Every day | ORAL | Status: DC

## 2015-04-22 MED ORDER — MEMANTINE HCL 28 X 5 MG & 21 X 10 MG PO TABS: ORAL_TABLET | ORAL | Status: DC

## 2015-04-22 NOTE — Progress Notes (Signed)
Reason for visit: Parkinsonism  [name] Oliver is an 81 y.o. female  History of present illness:  [name] Oliver is an 81 year old right-handed white female with a history of parkinsonism. The patient has initially gained excellent improvement with the low-dose Sinemet taking one half of a 25/100 mg tablet twice daily. The patient has had some memory issues, these issues have significantly worsened since last seen in May 2016. The patient has not been cooking as much, she has been losing weight even on the mirtazapine that she uses for sleep at night. The mirtazapine has helped the depression and anxiety issues as well. The patient is reporting a lot of fatigue, it is not clear that the fatigue is related to the use of mirtazapine. The patient has not had any falls. She returns for an evaluation.  Past Medical History  Diagnosis Date  . Chronic cough   . Hip fracture (HCC)   . Pelvis fracture (HCC)   . Memory difficulty 01/08/2014  . Primary Parkinsonism [facility](HCC)     Past Surgical History  Procedure Laterality Date  . Fracture surgery    . Abdominal hysterectomy    . Joint replacement    . Cataract extraction Bilateral     03/14/13  . Breast lumpectomy Right   . Abdominal hysterectomy    . Total hip arthroplasty Right     Family History  Problem Relation Age of Onset  . Diabetes Other   . Dementia Mother   . Bipolar disorder Sister   . Parkinsonism Sister     Social history:  reports that she has never smoked. She has never used smokeless tobacco. She reports that she does not drink alcohol or use illicit drugs.    Allergies  Allergen Reactions  . Morphine And Related Other (See Comments)    Hallucinations  . Prednisone Other (See Comments)    manic    Medications:  Prior to Admission medications   Medication Sig Start Date End Date Taking? Authorizing Provider  carbidopa-levodopa (SINEMET IR) 25-100 MG tablet Take 1 tablet by mouth 2 (two) times daily. 04/22/15  Yes York Spanielharles  K Eriberto Felch, MD  Cranberry Fruit 475 MG CAPS Take 1 capsule by mouth 2 (two) times daily.   Yes Historical Provider, MD  mirtazapine (REMERON) 30 MG tablet Take 1 tablet (30 mg total) by mouth at bedtime. 04/22/15  Yes York Spanielharles K Anjolie Majer, MD  memantine [facility](NAMENDA TITRATION PAK) tablet pack 5 mg/day for =1 week; 5 mg twice daily for =1 week; 15 mg/day given in 5 mg and 10 mg separated doses for =1 week; then 10 mg twice daily 04/22/15   York Spanielharles K Journey Castonguay, MD    ROS:  Out of a complete 14 system review of symptoms, the patient complains only of the following symptoms, and all other reviewed systems are negative.  Fatigue Memory loss Weight loss  Blood pressure 154/82, pulse 87, height 5\' 5"  (1.651 m), weight 124 lb (56.246 kg).  Physical Exam  General: The patient is alert and cooperative at the time of the examination.  Skin: No significant peripheral edema is noted.   Neurologic Exam  Mental status: The patient is alert and oriented x 2 at the time of the examination (not oriented to date). The Mini-Mental Status Examination done today shows a total score 24/30. The patient is able to name 3 animals in 30 seconds..   Cranial nerves: Facial symmetry is present. Speech is normal, no aphasia or dysarthria is noted. Extraocular movements are  full. Visual fields are full. Masking of the face is seen.  Motor: The patient has good strength in all 4 extremities.  Sensory examination: Soft touch sensation is symmetric on the face, arms, and legs.  Coordination: The patient has good finger-nose-finger and heel-to-shin bilaterally.  Gait and station: The patient is able to arise from a seated position with arms crossed. Once up, the patient is able to ambulate independently with decreased arm swing, slightly stooped posture. The patient has fairly good turns. Romberg is negative. No drift is seen.  Reflexes: Deep tendon reflexes are symmetric.   Assessment/Plan:  1. Parkinsonism  2. Memory  disturbance  3. Anxiety and depression   The patient has reported some worsening memory issues, but she is not a good candidate for Aricept given the recent weight loss. The patient will be placed on Namenda. The patient will continue her Sinemet and the Remeron. The patient is having a lot of fatigue issues, the etiology is not clear. The patient is sleeping well on the Remeron. The patient will follow-up in 5 months, sooner if needed. She will call after 1 month on the Namenda Dosepak for a maintenance prescription for the medication.     Marlan Palau MD 04/22/2015 6:47 PM  [facility] [address] [place], [place] [address]  Phone [phone] Fax [phone]

## 2015-04-22 NOTE — Patient Instructions (Addendum)
   We will start Namenda for the memory. We will start a titration pack, call for a prescription for the medication after one month.   Parkinson Disease Parkinson disease is a disorder of the central nervous system, which includes the brain and spinal cord. A person with this disease slowly loses the ability to completely control body movements. Within the brain, there is a group of nerve cells (basal ganglia) that help control movement. The basal ganglia are damaged and do not work properly in a person with Parkinson disease. In addition, the basal ganglia produce and use a brain chemical called dopamine. The dopamine chemical sends messages to other parts of the body to control and coordinate body movements. Dopamine levels are low in a person with Parkinson disease. If the dopamine levels are low, then the body does not receive the correct messages it needs to move normally.  CAUSES  The exact reason why the basal ganglia get damaged is not known. Some medical researchers have thought that infection, genes, environment, and certain medicines may contribute to the cause.  SYMPTOMS   An early symptom of Parkinson disease is often an uncontrolled shaking (tremor) of the hands. The tremor will often disappear when the affected hand is consciously used.  As the disease progresses, walking, talking, getting out of a chair, and new movements become more difficult.  Muscles get stiff and movements become slower.  Balance and coordination become harder.  Depression, trouble swallowing, urinary problems, constipation, and sleep problems can occur.  Later in the disease, memory and thought processes may deteriorate. DIAGNOSIS  There are no specific tests to diagnose Parkinson disease. You may be referred to a neurologist for evaluation. Your caregiver will ask about your medical history, symptoms, and perform a physical exam. Blood tests and imaging tests of your brain may be performed to rule out  other diseases. The imaging tests may include an MRI or a CT scan. TREATMENT  The goal of treatment is to relieve symptoms. Medicines may be prescribed once the symptoms become troublesome. Medicine will not stop the progression of the disease, but medicine can make movement and balance better and help control tremors. Speech and occupational therapy may also be prescribed. Sometimes, surgical treatment of the brain can be done in young people. HOME CARE INSTRUCTIONS  Get regular exercise and rest periods during the day to help prevent exhaustion and depression.  If getting dressed becomes difficult, replace buttons and zippers with Velcro and elastic on your clothing.  Take all medicine as directed by your caregiver.  Install grab bars or railings in your home to prevent falls.  Go to speech or occupational therapy as directed.  Keep all follow-up visits as directed by your caregiver. SEEK MEDICAL CARE IF:  Your symptoms are not controlled with your medicine.  You fall.  You have trouble swallowing or choke on your food. MAKE SURE YOU:  Understand these instructions.  Will watch your condition.  Will get help right away if you are not doing well or get worse.   This information is not intended to replace advice given to you by your health care provider. Make sure you discuss any questions you have with your health care provider.   Document Released: 05/21/2000 Document Revised: 09/18/2012 Document Reviewed: 06/23/2011 Elsevier Interactive Patient Education [employer]2016 Elsevier Inc.

## 2015-05-07 ENCOUNTER — Telehealth: Payer: Self-pay | Admitting: Neurology

## 2015-05-07 MED ORDER — MEMANTINE HCL ER 7 MG PO CP24
ORAL_CAPSULE | ORAL | Status: DC
Start: 2015-05-07 — End: 2015-07-21

## 2015-05-07 NOTE — Telephone Encounter (Signed)
Pt's daughter Luiz Blarennajane called sts she concerned pt is having side effect from memantine (NAMENDA TITRATION PAK) tablet pack . She is having dizziness which started today, when she awoke from a nap this morning about 11am she was dizzy and fells very nauseated. Please call and advise

## 2015-05-07 NOTE — Telephone Encounter (Signed)
I called the patient's daughter. She stated that the patient just recently started taking Namenda 5 mg twice daily. She states that the patient is now dizzy and feels like she may fall. I advised that it is a possible side effect of Namenda. I also advised not to take another dose until she hears back from us. Patient's daughter verbalized understanding.

## 2015-05-07 NOTE — Telephone Encounter (Signed)
I called the daughter. The patient is having a lot of side effects on Namenda at 10 mg dose. She is having a lot of dizziness, nausea, gait instability. She will stop the medication, I will try her on the long-acting preparation deceive she tolerates that better going to 7 mg daily for one week, then 14 mg daily. If she has recurrence of symptoms, she is to stop the medication.

## 2015-05-08 NOTE — Telephone Encounter (Addendum)
Pt called and states she called her pharmacy to see if new rx was ready. They told her they have not received any new rx's for her. Please call and advise [phone]

## 2015-05-08 NOTE — Telephone Encounter (Signed)
I called [facility] to check on Rx. We received confirmation of receipt 05/07/15. They informed me that they had to send in a PA for the medication. I called the patient back and explained.

## 2015-05-20 ENCOUNTER — Telehealth: Payer: Self-pay | Admitting: Neurology

## 2015-05-20 NOTE — Telephone Encounter (Signed)
Pts needs prior auth on memantine (NAMENDA XR) 7 MG CP24 24 hr capsule. The daughter want to know if a generic is available. May [phone], Tobi BastosAnna

## 2015-05-21 NOTE — Telephone Encounter (Signed)
I apologize for the delay, however I have been out of the office.  It appears the patient has adverse reactions to the Namenda IR (which is available in generic).  Unfortunately, XR is not available in generic.  Ins has been contacted and provided with clinical info.  Request is currently under review [id number] [id number]  [employer] has approved the request for coverage on Namenda effective until 06/21/2015 Ref ID # [id number] I spoke with Ms Tobi Bastosnna.  She is aware.

## 2015-07-04 DIAGNOSIS — E46 Unspecified protein-calorie malnutrition: Secondary | ICD-10-CM | POA: Diagnosis not present

## 2015-07-04 DIAGNOSIS — G20 Parkinson's disease: Secondary | ICD-10-CM | POA: Diagnosis not present

## 2015-07-21 ENCOUNTER — Other Ambulatory Visit: Payer: Self-pay

## 2015-07-21 MED ORDER — MEMANTINE HCL ER 7 MG PO CP24
ORAL_CAPSULE | ORAL | Status: DC
Start: 2015-07-21 — End: 2015-10-27

## 2015-09-19 ENCOUNTER — Ambulatory Visit (INDEPENDENT_AMBULATORY_CARE_PROVIDER_SITE_OTHER): Payer: Medicare Other | Admitting: Neurology

## 2015-09-19 ENCOUNTER — Encounter: Payer: Self-pay | Admitting: Neurology

## 2015-09-19 VITALS — BP 144/82 | HR 87 | Ht 65.0 in | Wt 123.0 lb

## 2015-09-19 DIAGNOSIS — R413 Other amnesia: Secondary | ICD-10-CM | POA: Diagnosis not present

## 2015-09-19 DIAGNOSIS — R269 Unspecified abnormalities of gait and mobility: Secondary | ICD-10-CM | POA: Diagnosis not present

## 2015-09-19 DIAGNOSIS — G219 Secondary parkinsonism, unspecified: Secondary | ICD-10-CM

## 2015-09-19 MED ORDER — CARBIDOPA-LEVODOPA 25-100 MG PO TABS: 1.0000 | ORAL_TABLET | Freq: Two times a day (BID) | ORAL | Status: DC

## 2015-09-19 NOTE — Progress Notes (Signed)
Reason for visit: Parkinson's disease  [name] Oliver is an 82 y.o. female  History of present illness:  Ms. [name] Oliver is an 82 year old right-handed white female with a history of mild Parkinson's disease and a mild gait disorder associated with some memory issues. The patient has become more stooped since her last visit. She has been able to maintain her weight, she is no longer losing a significant amount of weight, but she does not eat well, particularly in the evening hours. The patient denies any falls, she tries to stay active and she walks on a daily basis. She was not able to tolerate generic Namenda, she was switched to the long-acting preparation and she has been able to be maintained on the 14 mg daily dose. The patient has had some trouble with fatigue. When she is standing she seems to suddenly become wiped out, and she has to sit for a few moments. She has not had any blackout episodes. She does report some back pain and some muscle cramps at times. She is sleeping fairly well at night. She denies any issues with swallowing.  Past Medical History  Diagnosis Date  . Chronic cough   . Hip fracture (HCC)   . Pelvis fracture (HCC)   . Memory difficulty 01/08/2014  . Primary Parkinsonism [facility](HCC)     Past Surgical History  Procedure Laterality Date  . Fracture surgery    . Abdominal hysterectomy    . Joint replacement    . Cataract extraction Bilateral     03/14/13  . Breast lumpectomy Right   . Abdominal hysterectomy    . Total hip arthroplasty Right     Family History  Problem Relation Age of Onset  . Diabetes Other   . Dementia Mother   . Bipolar disorder Sister   . Parkinsonism Sister     Social history:  reports that she has never smoked. She has never used smokeless tobacco. She reports that she does not drink alcohol or use illicit drugs.    Allergies  Allergen Reactions  . Morphine And Related Other (See Comments)    Hallucinations  . Prednisone Other (See  Comments)    manic    Medications:  Prior to Admission medications   Medication Sig Start Date End Date Taking? Authorizing Provider  carbidopa-levodopa (SINEMET IR) 25-100 MG tablet Take 1 tablet by mouth 2 (two) times daily. 04/22/15  Yes York Spanielharles K Willis, MD  Cranberry Fruit 475 MG CAPS Take 1 capsule by mouth 2 (two) times daily.   Yes Historical Provider, MD  memantine (NAMENDA XR) 7 MG CP24 24 hr capsule One capsule daily for one week, then take 2 capsules daily 07/21/15  Yes York Spanielharles K Willis, MD  mirtazapine (REMERON) 30 MG tablet Take 1 tablet (30 mg total) by mouth at bedtime. 04/22/15  Yes York Spanielharles K Willis, MD    ROS:  Out of a complete 14 system review of symptoms, the patient complains only of the following symptoms, and all other reviewed systems are negative.  Fatigue Back pain, muscle cramps Memory disturbance  Blood pressure 144/82, pulse 87, height 5\' 5"  (1.651 m), weight 123 lb (55.792 kg).   Blood pressure, right arm, sitting is 108/80. Blood pressure, standing, right arm is 106/76.  Physical Exam  General: The patient is alert and cooperative at the time of the examination.  Skin: No significant peripheral edema is noted.   Neurologic Exam  Mental status: The patient is alert and oriented x 3 at  the time of the examination. The Mini-Mental Status Examination done today shows a total score 27/30. The patient is able to name 7 animals in 30 seconds.   Cranial nerves: Facial symmetry is present. Speech is normal, no aphasia or dysarthria is noted. Extraocular movements are full. Visual fields are full.  Motor: The patient has good strength in all 4 extremities.  Sensory examination: Soft touch sensation is symmetric on the face, arms, and legs.  Coordination: The patient has good finger-nose-finger and heel-to-shin bilaterally.  Gait and station: The patient is able to arise from a seated position with arms crossed. Once up, she is able to ambulate  independently, some decrease in arm swing is seen bilaterally. The patient does have some hesitancy with turns, shuffles with turns. Romberg is negative. No drift is seen.  Reflexes: Deep tendon reflexes are symmetric.   Assessment/Plan:  1. Parkinson's disease  2. Memory disturbance  The patient is having episodes of feeling suddenly wiped out, she has to sit for a few moments and rapidly recovers. I am concerned that these episodes represent orthostatic hypotension, but this could not be demonstrated today in the office. The patient may go on a higher salt level in the diet, and drink Gatorade during the day. The patient is having some hesitancy with turns, she does not wish to go up on the Sinemet dose at this time, we will follow this over time. The patient is doing fairly well with the Namenda XR capsule taking 14 mg daily. A prescription was called in for the Sinemet, she will follow-up in 6 months, sooner if needed.  Marlan Palau MD 09/21/2015 8:01 PM  [facility] [address] [place], [place] [address]  Phone [phone] Fax [phone]

## 2015-09-24 DIAGNOSIS — E782 Mixed hyperlipidemia: Secondary | ICD-10-CM | POA: Diagnosis not present

## 2015-09-24 DIAGNOSIS — I10 Essential (primary) hypertension: Secondary | ICD-10-CM | POA: Diagnosis not present

## 2015-10-13 DIAGNOSIS — Z1231 Encounter for screening mammogram for malignant neoplasm of breast: Secondary | ICD-10-CM | POA: Diagnosis not present

## 2015-10-27 ENCOUNTER — Telehealth: Payer: Self-pay | Admitting: *Deleted

## 2015-10-27 MED ORDER — MEMANTINE HCL 5 MG PO TABS: 5.0000 mg | ORAL_TABLET | Freq: Two times a day (BID) | ORAL | Status: DC

## 2015-10-27 NOTE — Telephone Encounter (Signed)
I called patient. The patient cannot afford the extended release Namenda, I'll switch her to the 5 mg Namenda tablets taking 1 twice daily which is the generic. The patient is amenable to this.

## 2015-10-27 NOTE — Telephone Encounter (Signed)
Caller Audria NineNNA DANIEL                CID  [id number]  Patient [name] MccallumORMA Ouzts           Pt's Dr Anne HahnWILLIS       Area Code 540 Phone# [phone] * DOB [phone]      RE PT NEEDS TO BE SWITCH MEDICATION- NAMEDA TO        A GENERIC VERSION - TOO EXPENSIVE P.C.B.            Disp:Y/N N If Y = C/B If No Response In 20minutes ============================================================

## 2015-11-10 ENCOUNTER — Other Ambulatory Visit: Payer: Self-pay

## 2015-11-10 MED ORDER — MIRTAZAPINE 30 MG PO TABS: 30.0000 mg | ORAL_TABLET | Freq: Every day | ORAL | Status: AC

## 2015-11-10 NOTE — Telephone Encounter (Signed)
Retailed per faxed request from pharmacy.

## 2016-01-27 DIAGNOSIS — G20 Parkinson's disease: Secondary | ICD-10-CM | POA: Diagnosis not present

## 2016-01-27 DIAGNOSIS — I781 Nevus, non-neoplastic: Secondary | ICD-10-CM | POA: Diagnosis not present

## 2016-01-27 DIAGNOSIS — F331 Major depressive disorder, recurrent, moderate: Secondary | ICD-10-CM | POA: Diagnosis not present

## 2016-01-27 DIAGNOSIS — E46 Unspecified protein-calorie malnutrition: Secondary | ICD-10-CM | POA: Diagnosis not present

## 2016-01-28 DIAGNOSIS — E559 Vitamin D deficiency, unspecified: Secondary | ICD-10-CM | POA: Diagnosis not present

## 2016-01-28 DIAGNOSIS — R410 Disorientation, unspecified: Secondary | ICD-10-CM | POA: Diagnosis not present

## 2016-01-28 DIAGNOSIS — D519 Vitamin B12 deficiency anemia, unspecified: Secondary | ICD-10-CM | POA: Diagnosis not present

## 2016-01-28 DIAGNOSIS — R5383 Other fatigue: Secondary | ICD-10-CM | POA: Diagnosis not present

## 2016-01-28 DIAGNOSIS — R531 Weakness: Secondary | ICD-10-CM | POA: Diagnosis not present

## 2016-02-10 ENCOUNTER — Telehealth: Payer: Self-pay | Admitting: Neurology

## 2016-02-10 NOTE — Telephone Encounter (Signed)
Patient's daughter is calling stating there is a decline in the patient's health and she needs to be seen soon. The patient does have an appointment 04-01-16. Please call to discuss.

## 2016-02-10 NOTE — Telephone Encounter (Signed)
Returned call and spoke to pt's daughter. She has noticed increased confusion and fatigue in pt over the past 3 weeks which she "hasn't bounced back out of." Pt did see PCP and had labs done, negative for UTI per report. Sooner appt scheduled 02/24/16.

## 2016-02-11 NOTE — Telephone Encounter (Signed)
Returned call to pt's daughter. Appt re-scheduled as requested.

## 2016-02-11 NOTE — Telephone Encounter (Signed)
Pt's daughter called in stating that 9/19 will not work and is wondering if 9/15 early morning , it will be easier for her. Please call [phone] Huntley Estellenna Jane Daniel.

## 2016-02-20 ENCOUNTER — Ambulatory Visit (INDEPENDENT_AMBULATORY_CARE_PROVIDER_SITE_OTHER): Payer: Medicare Other | Admitting: Neurology

## 2016-02-20 ENCOUNTER — Encounter: Payer: Self-pay | Admitting: Neurology

## 2016-02-20 VITALS — BP 163/83 | HR 83 | Ht 65.0 in | Wt 119.0 lb

## 2016-02-20 DIAGNOSIS — E538 Deficiency of other specified B group vitamins: Secondary | ICD-10-CM

## 2016-02-20 DIAGNOSIS — G20 Parkinson's disease: Secondary | ICD-10-CM

## 2016-02-20 DIAGNOSIS — F0280 Dementia in other diseases classified elsewhere without behavioral disturbance: Secondary | ICD-10-CM

## 2016-02-20 DIAGNOSIS — G219 Secondary parkinsonism, unspecified: Secondary | ICD-10-CM | POA: Diagnosis not present

## 2016-02-20 DIAGNOSIS — R269 Unspecified abnormalities of gait and mobility: Secondary | ICD-10-CM | POA: Diagnosis not present

## 2016-02-20 DIAGNOSIS — R413 Other amnesia: Secondary | ICD-10-CM | POA: Diagnosis not present

## 2016-02-20 MED ORDER — CYANOCOBALAMIN 1000 MCG/ML IJ SOLN
1000.0000 ug | Freq: Once | INTRAMUSCULAR | Status: AC
  Administered 2016-02-20: 1000 ug via INTRAMUSCULAR

## 2016-02-20 NOTE — Progress Notes (Signed)
Reason for visit: Parkinson's disease  [name] Oliver is an 82 y.o. female  History of present illness:  [name] Oliver is a 82 year old right-handed white female with a history of Parkinson's disease associated with a mild memory disorder. The patient has had ongoing issues with decreased appetite, some weight loss. The patient is now using boost to help maintain her weight. She is off Aricept, on Namenda. She is taking Remeron at night to help boost appetite and to help her sleep. She sleeps well at night. She has reported some increased fatigue, feeling poorly since her husband went for a pacemaker replacement. The patient seems to get a bit more confused when the fatigue worsens. She denies any numbness or weakness on the arms or legs. There have been no changes in balance.  Blood work recently done shows a low normal vitamin B12 level of 225, otherwise the patient has had a TSH of 2.07, normal CBC and comprehensive metabolic profile.  Past Medical History:  Diagnosis Date  . Chronic cough   . Hip fracture (HCC)   . Memory difficulty 01/08/2014  . Pelvis fracture (HCC)   . Primary Parkinsonism [facility])     Past Surgical History:  Procedure Laterality Date  . ABDOMINAL HYSTERECTOMY    . ABDOMINAL HYSTERECTOMY    . BREAST LUMPECTOMY Right   . CATARACT EXTRACTION Bilateral    03/14/13  . FRACTURE SURGERY    . JOINT REPLACEMENT    . TOTAL HIP ARTHROPLASTY Right     Family History  Problem Relation Age of Onset  . Diabetes Other   . Dementia Mother   . Bipolar disorder Sister   . Parkinsonism Sister     Social history:  reports that she has never smoked. She has never used smokeless tobacco. She reports that she does not drink alcohol or use drugs.    Allergies  Allergen Reactions  . Morphine And Related Other (See Comments)    Hallucinations  . Prednisone Other (See Comments)    manic    Medications:  Prior to Admission medications   Medication Sig Start Date End Date  Taking? Authorizing Provider  carbidopa-levodopa (SINEMET IR) 25-100 MG tablet Take 1 tablet by mouth 2 (two) times daily. 09/19/15  Yes [name] Spaniel, MD  Cranberry Fruit 475 MG CAPS Take 1 capsule by mouth 2 (two) times daily.   Yes Historical Provider, MD  memantine (NAMENDA) 5 MG tablet Take 1 tablet (5 mg total) by mouth 2 (two) times daily. 10/27/15  Yes [name] Spaniel, MD  mirtazapine (REMERON) 30 MG tablet Take 1 tablet (30 mg total) by mouth at bedtime. 11/10/15  Yes [name] Spaniel, MD    ROS:  Out of a complete 14 system review of symptoms, the patient complains only of the following symptoms, and all other reviewed systems are negative.  Fatigue Back pain Memory loss Confusion Daytime sleepiness  Blood pressure (!) 163/83, pulse 83, height 5\' 5"  (1.651 m), weight 119 lb (54 kg).  Physical Exam  General: The patient is alert and cooperative at the time of the examination.  Skin: No significant peripheral edema is noted.   Neurologic Exam  Mental status: The patient is alert and oriented x 3 at the time of the examination. The patient has apparent normal recent and remote memory, with an apparently normal attention span and concentration ability. The Mini-Mental Status Examination done today shows a total score 24/30. The patient is able to name 8 animals in 30  seconds.   Cranial nerves: Facial symmetry is present. Speech is normal, no aphasia or dysarthria is noted. Extraocular movements are full. Visual fields are full. Masking of the face is seen.  Motor: The patient has good strength in all 4 extremities.  Sensory examination: Soft touch sensation is symmetric on the face, arms, and legs.  Coordination: The patient has good finger-nose-finger and heel-to-shin bilaterally.  Gait and station: The patient is able to arise from a seated position with arms crossed. Once up, she ambulates independently, decreased arm swing bilaterally. The patient is able to perform  tandem gait. Romberg is negative.  Reflexes: Deep tendon reflexes are symmetric.   Assessment/Plan:  1. Parkinson's disease  2. Reports of fatigue  3. Mild memory disturbance  The patient is having increased fatigue, some increase in confusion. The patient appears to be sleeping well with Remeron, but she appears to be fidgety during the day, there may be some underlying anxiety and depression issues at work. The patient has had blood work and urinalysis done that did not show any obvious etiology of her increased symptoms. The patient has had a vitamin B12 level of 225, on the low end of normal. We will check a methylmalonic acid level. We will administer a vitamin B12 injection today see this helps the fatigue. The patient follow-up in 4 months. Clinical examination today is otherwise unremarkable.  Marlan Palau. Keith Rayanna Matusik MD 02/20/2016 12:29 PM  [facility] [address] [place], [place] [address]  Phone [phone] Fax [phone]

## 2016-02-20 NOTE — Progress Notes (Signed)
Dr. Anne HahnWillis ordered  B 12 injection.  Under aseptic technique cyanocobalamin 106800mcg/1ml IM given L deltoid.   .  Tolerated well.  Bandaid applied.

## 2016-02-24 ENCOUNTER — Ambulatory Visit: Payer: Self-pay | Admitting: Neurology

## 2016-02-24 ENCOUNTER — Telehealth: Payer: Self-pay | Admitting: Neurology

## 2016-02-24 LAB — METHYLMALONIC ACID, SERUM: Methylmalonic Acid: 494 nmol/L — ABNORMAL HIGH (ref 0–378)

## 2016-02-24 NOTE — Telephone Encounter (Signed)
I called patient. The methylmalonic acid level was somewhat elevated, this suggests that the low normal vitamin B12 level was functionally low.   I'll have the patient come in in 2 weeks for another B12 shot, she seems to gain some benefit with the initial injection.

## 2016-03-04 ENCOUNTER — Telehealth: Payer: Self-pay | Admitting: Neurology

## 2016-03-04 NOTE — Telephone Encounter (Signed)
Returned call to pt's daughter. Reviewed lab results: "methylmalonic acid level was somewhat elevated which suggests that the low normal vitamin B12 level was functionally low." Daughter asks if orders for vitamin B injections could be sent to PCP to be given at Dr. Scharlene GlossHall's [place]. Also requests that all results be called to her Huntley Estelle(Anna Jane Daniel[facility] and also shared w/ Dr. Margo AyeHall.

## 2016-03-04 NOTE — Telephone Encounter (Signed)
I called and talked with the family. The patient did gain some benefit with energy, some improvement in cognitive functioning on B12, but after 2 weeks the benefit has gone away. The family wants to continue the B12 injections through primary care physician, I have no problem with this.  I will send a primary care physician a note regarding this issue.. The vitamin B12 level was in the low normal range at 225, methylmalonic acid level was elevated suggesting a functional deficiency.

## 2016-03-04 NOTE — Telephone Encounter (Signed)
Pt' daughter, Huntley Estellenna Jane Daniel called stating the pt can not remember what her lab work results were. Tobi Bastosnna also wants to know about pts B12 deficiency and if injections and be arranged somewhere else. Please call [phone]

## 2016-03-07 ENCOUNTER — Other Ambulatory Visit: Payer: Self-pay | Admitting: Neurology

## 2016-03-11 ENCOUNTER — Telehealth: Payer: Self-pay | Admitting: Neurology

## 2016-03-11 MED ORDER — CYANOCOBALAMIN 1000 MCG/ML IJ SOLN: 1000.0000 ug | INTRAMUSCULAR | 3 refills | Status: DC

## 2016-03-11 NOTE — Telephone Encounter (Signed)
I have called in a prescription for injectable vitamin B12.

## 2016-03-11 NOTE — Telephone Encounter (Signed)
Pt's daughter said PCP does not keep B12 in stock. She said she was told that she will need to bring the medication with them. Pls sent RX to [facility]

## 2016-03-11 NOTE — Addendum Note (Signed)
Addended by: Stephanie AcreWILLIS, CHARLES on: 03/11/2016 05:07 PM   Modules accepted: Orders

## 2016-03-11 NOTE — Telephone Encounter (Addendum)
Pt's daughter called said B12 shots are go

## 2016-03-12 DIAGNOSIS — D519 Vitamin B12 deficiency anemia, unspecified: Secondary | ICD-10-CM | POA: Diagnosis not present

## 2016-04-01 ENCOUNTER — Ambulatory Visit: Payer: Medicare Other | Admitting: Neurology

## 2016-04-01 DIAGNOSIS — Z79899 Other long term (current) drug therapy: Secondary | ICD-10-CM | POA: Diagnosis not present

## 2016-04-01 DIAGNOSIS — D518 Other vitamin B12 deficiency anemias: Secondary | ICD-10-CM | POA: Diagnosis not present

## 2016-04-01 DIAGNOSIS — D519 Vitamin B12 deficiency anemia, unspecified: Secondary | ICD-10-CM | POA: Diagnosis not present

## 2016-04-01 DIAGNOSIS — I10 Essential (primary) hypertension: Secondary | ICD-10-CM | POA: Diagnosis not present

## 2016-04-01 DIAGNOSIS — E119 Type 2 diabetes mellitus without complications: Secondary | ICD-10-CM | POA: Diagnosis not present

## 2016-04-01 DIAGNOSIS — E039 Hypothyroidism, unspecified: Secondary | ICD-10-CM | POA: Diagnosis not present

## 2016-04-01 DIAGNOSIS — E46 Unspecified protein-calorie malnutrition: Secondary | ICD-10-CM | POA: Diagnosis not present

## 2016-04-01 DIAGNOSIS — E440 Moderate protein-calorie malnutrition: Secondary | ICD-10-CM | POA: Diagnosis not present

## 2016-04-06 DIAGNOSIS — R7989 Other specified abnormal findings of blood chemistry: Secondary | ICD-10-CM | POA: Diagnosis not present

## 2016-04-06 DIAGNOSIS — F0390 Unspecified dementia without behavioral disturbance: Secondary | ICD-10-CM | POA: Diagnosis not present

## 2016-04-06 DIAGNOSIS — G20 Parkinson's disease: Secondary | ICD-10-CM | POA: Diagnosis not present

## 2016-04-06 DIAGNOSIS — E46 Unspecified protein-calorie malnutrition: Secondary | ICD-10-CM | POA: Diagnosis not present

## 2016-04-09 DIAGNOSIS — D519 Vitamin B12 deficiency anemia, unspecified: Secondary | ICD-10-CM | POA: Diagnosis not present

## 2016-04-21 ENCOUNTER — Other Ambulatory Visit: Payer: Self-pay | Admitting: Neurology

## 2016-05-02 ENCOUNTER — Other Ambulatory Visit: Payer: Self-pay | Admitting: Neurology

## 2016-05-07 DIAGNOSIS — D519 Vitamin B12 deficiency anemia, unspecified: Secondary | ICD-10-CM | POA: Diagnosis not present

## 2016-05-07 DIAGNOSIS — Z6821 Body mass index (BMI) 21.0-21.9, adult: Secondary | ICD-10-CM | POA: Diagnosis not present

## 2016-05-07 DIAGNOSIS — G20 Parkinson's disease: Secondary | ICD-10-CM | POA: Diagnosis not present

## 2016-05-11 DIAGNOSIS — F411 Generalized anxiety disorder: Secondary | ICD-10-CM | POA: Diagnosis not present

## 2016-05-11 DIAGNOSIS — R030 Elevated blood-pressure reading, without diagnosis of hypertension: Secondary | ICD-10-CM | POA: Diagnosis not present

## 2016-05-21 DIAGNOSIS — E440 Moderate protein-calorie malnutrition: Secondary | ICD-10-CM | POA: Diagnosis not present

## 2016-05-21 DIAGNOSIS — Z9181 History of falling: Secondary | ICD-10-CM | POA: Diagnosis not present

## 2016-05-21 DIAGNOSIS — G20 Parkinson's disease: Secondary | ICD-10-CM | POA: Diagnosis not present

## 2016-05-21 DIAGNOSIS — D519 Vitamin B12 deficiency anemia, unspecified: Secondary | ICD-10-CM | POA: Diagnosis not present

## 2016-05-21 DIAGNOSIS — F0280 Dementia in other diseases classified elsewhere without behavioral disturbance: Secondary | ICD-10-CM | POA: Diagnosis not present

## 2016-05-21 DIAGNOSIS — F331 Major depressive disorder, recurrent, moderate: Secondary | ICD-10-CM | POA: Diagnosis not present

## 2016-05-21 DIAGNOSIS — F411 Generalized anxiety disorder: Secondary | ICD-10-CM | POA: Diagnosis not present

## 2016-06-02 DIAGNOSIS — F411 Generalized anxiety disorder: Secondary | ICD-10-CM | POA: Diagnosis not present

## 2016-06-02 DIAGNOSIS — E440 Moderate protein-calorie malnutrition: Secondary | ICD-10-CM | POA: Diagnosis not present

## 2016-06-02 DIAGNOSIS — D519 Vitamin B12 deficiency anemia, unspecified: Secondary | ICD-10-CM | POA: Diagnosis not present

## 2016-06-02 DIAGNOSIS — F331 Major depressive disorder, recurrent, moderate: Secondary | ICD-10-CM | POA: Diagnosis not present

## 2016-06-02 DIAGNOSIS — G20 Parkinson's disease: Secondary | ICD-10-CM | POA: Diagnosis not present

## 2016-06-02 DIAGNOSIS — F0280 Dementia in other diseases classified elsewhere without behavioral disturbance: Secondary | ICD-10-CM | POA: Diagnosis not present

## 2016-06-04 DIAGNOSIS — F0280 Dementia in other diseases classified elsewhere without behavioral disturbance: Secondary | ICD-10-CM | POA: Diagnosis not present

## 2016-06-04 DIAGNOSIS — G20 Parkinson's disease: Secondary | ICD-10-CM | POA: Diagnosis not present

## 2016-06-04 DIAGNOSIS — D519 Vitamin B12 deficiency anemia, unspecified: Secondary | ICD-10-CM | POA: Diagnosis not present

## 2016-06-04 DIAGNOSIS — E440 Moderate protein-calorie malnutrition: Secondary | ICD-10-CM | POA: Diagnosis not present

## 2016-06-04 DIAGNOSIS — F331 Major depressive disorder, recurrent, moderate: Secondary | ICD-10-CM | POA: Diagnosis not present

## 2016-06-04 DIAGNOSIS — F411 Generalized anxiety disorder: Secondary | ICD-10-CM | POA: Diagnosis not present

## 2016-06-08 DIAGNOSIS — F411 Generalized anxiety disorder: Secondary | ICD-10-CM | POA: Diagnosis not present

## 2016-06-08 DIAGNOSIS — G20 Parkinson's disease: Secondary | ICD-10-CM | POA: Diagnosis not present

## 2016-06-08 DIAGNOSIS — E440 Moderate protein-calorie malnutrition: Secondary | ICD-10-CM | POA: Diagnosis not present

## 2016-06-08 DIAGNOSIS — F331 Major depressive disorder, recurrent, moderate: Secondary | ICD-10-CM | POA: Diagnosis not present

## 2016-06-08 DIAGNOSIS — F0280 Dementia in other diseases classified elsewhere without behavioral disturbance: Secondary | ICD-10-CM | POA: Diagnosis not present

## 2016-06-08 DIAGNOSIS — D519 Vitamin B12 deficiency anemia, unspecified: Secondary | ICD-10-CM | POA: Diagnosis not present

## 2016-06-10 DIAGNOSIS — F411 Generalized anxiety disorder: Secondary | ICD-10-CM | POA: Diagnosis not present

## 2016-06-10 DIAGNOSIS — F331 Major depressive disorder, recurrent, moderate: Secondary | ICD-10-CM | POA: Diagnosis not present

## 2016-06-10 DIAGNOSIS — F0280 Dementia in other diseases classified elsewhere without behavioral disturbance: Secondary | ICD-10-CM | POA: Diagnosis not present

## 2016-06-10 DIAGNOSIS — E440 Moderate protein-calorie malnutrition: Secondary | ICD-10-CM | POA: Diagnosis not present

## 2016-06-10 DIAGNOSIS — D519 Vitamin B12 deficiency anemia, unspecified: Secondary | ICD-10-CM | POA: Diagnosis not present

## 2016-06-10 DIAGNOSIS — G20 Parkinson's disease: Secondary | ICD-10-CM | POA: Diagnosis not present

## 2016-06-14 ENCOUNTER — Ambulatory Visit (INDEPENDENT_AMBULATORY_CARE_PROVIDER_SITE_OTHER): Payer: Medicare Other | Admitting: Neurology

## 2016-06-14 ENCOUNTER — Encounter: Payer: Self-pay | Admitting: Neurology

## 2016-06-14 VITALS — BP 143/82 | HR 83 | Ht 65.0 in | Wt 118.5 lb

## 2016-06-14 DIAGNOSIS — R269 Unspecified abnormalities of gait and mobility: Secondary | ICD-10-CM

## 2016-06-14 DIAGNOSIS — G219 Secondary parkinsonism, unspecified: Secondary | ICD-10-CM | POA: Diagnosis not present

## 2016-06-14 DIAGNOSIS — R413 Other amnesia: Secondary | ICD-10-CM | POA: Diagnosis not present

## 2016-06-14 DIAGNOSIS — R5382 Chronic fatigue, unspecified: Secondary | ICD-10-CM | POA: Insufficient documentation

## 2016-06-14 NOTE — Progress Notes (Signed)
Reason for visit: Parkinson's disease  [name] Oliver is an 83 y.o. female  History of present illness:  [name] Oliver is an 83 year old right-handed white female with a history of Parkinson's disease. The patient has suffered from significant fatigue. She has been noted to have a low normal vitamin B12 level, she went on B12 injections which seemed to help the fatigue for about a week after each injection, but she still has severe fatigue. She is sleeping better at night with Remeron, she has had some decline in memory and increased confusion in while on Namenda. The family claims that she is very sensitive to medications. She recently was tried on Namzaric, but she could not tolerate the drug. She has been able to maintain her weight, but she is not gaining weight. She returns to this office for an evaluation. She has not had falls, she has severe fatigue in the afternoon timeframe.  Past Medical History:  Diagnosis Date  . Chronic cough   . Hip fracture (HCC)   . Memory difficulty 01/08/2014  . Pelvis fracture (HCC)   . Primary Parkinsonism [name])     Past Surgical History:  Procedure Laterality Date  . ABDOMINAL HYSTERECTOMY    . ABDOMINAL HYSTERECTOMY    . BREAST LUMPECTOMY Right   . CATARACT EXTRACTION Bilateral    03/14/13  . FRACTURE SURGERY    . JOINT REPLACEMENT    . TOTAL HIP ARTHROPLASTY Right     Family History  Problem Relation Age of Onset  . Dementia Mother   . Bipolar disorder Sister   . Parkinsonism Sister   . Diabetes Other     Social history:  reports that she has never smoked. She has never used smokeless tobacco. She reports that she does not drink alcohol or use drugs.    Allergies  Allergen Reactions  . Morphine And Related Other (See Comments)    Hallucinations  . Prednisone Other (See Comments)    manic  . Namzaric [Memantine Hcl-Donepezil Hcl] Anxiety    Medications:  Prior to Admission medications   Medication Sig Start Date End Date Taking?  Authorizing Provider  ALPRAZolam Prudy Feeler) 0.25 MG tablet  05/11/16  Yes Historical Provider, MD  carbidopa-levodopa (SINEMET IR) 25-100 MG tablet take 1 tablet by mouth twice a day 03/18/16  Yes York Spaniel, MD  Cranberry Fruit 475 MG CAPS Take 1 capsule by mouth 2 (two) times daily.   Yes Historical Provider, MD  cyanocobalamin (,VITAMIN B-12,) 1000 MCG/ML injection Inject 1 mL (1,000 mcg total) into the muscle every 30 (thirty) days. 03/11/16  Yes York Spaniel, MD  memantine [facility]) 5 MG tablet take 1 tablet twice a day 04/22/16  Yes York Spaniel, MD  mirtazapine (REMERON) 30 MG tablet Take 1 tablet (30 mg total) by mouth at bedtime. 11/10/15  Yes York Spaniel, MD    ROS:  Out of a complete 14 system review of symptoms, the patient complains only of the following symptoms, and all other reviewed systems are negative.  Fatigue Memory loss Confusion, decreased concentration, depression  Blood pressure (!) 143/82, pulse 83, height 5\' 5"  (1.651 m), weight 118 lb 8 oz (53.8 kg).  Physical Exam  General: The patient is alert and cooperative at the time of the examination.  Skin: No significant peripheral edema is noted.   Neurologic Exam  Mental status: The patient is alert and oriented x 3 at the time of the examination. The patient has apparent normal recent  and remote memory, with an apparently normal attention span and concentration ability. Mini-Mental Status Examination done today shows a total score 27/30.   Cranial nerves: Facial symmetry is present. Speech is normal, no aphasia or dysarthria is noted. Extraocular movements are full. Visual fields are full. Masking of the face is seen.  Motor: The patient has good strength in all 4 extremities.  Sensory examination: Soft touch sensation is symmetric on the face, arms, and legs.  Coordination: The patient has good finger-nose-finger and heel-to-shin bilaterally.  Gait and station: The patient has some  hesitation with initiation of ambulation. Once she begins walking, she has relatively good stride, decreased arm swing bilaterally. She has some hesitation with turns. Tandem gait was not attempted. Romberg is negative. No drift is seen.  Reflexes: Deep tendon reflexes are symmetric.   Assessment/Plan:  1. Parkinson's disease  2. Chronic fatigue  3. Gait disorder  4. Memory disorder  The patient does not appear to have symptoms consistent with a REM sleep disorder. The patient is having severe fatigue. The family indicates that she is very sensitive to medications. We will stop the Namenda. We may consider adding Symmetrel if she is still having significant fatigue. The Sinemet may be adding to this, but it is the treatment of choice for her Parkinson's disease. She will follow-up in about 4 months.  Marlan Palau. Keith Ieisha Gao MD 06/14/2016 1:13 PM  [facility] [address] [place], [place] [id number]  Phone [phone] Fax [phone]

## 2016-06-14 NOTE — Patient Instructions (Signed)
   We will stop the nemenda. Call in 3 to 4 weeks.

## 2016-06-15 DIAGNOSIS — F331 Major depressive disorder, recurrent, moderate: Secondary | ICD-10-CM | POA: Diagnosis not present

## 2016-06-15 DIAGNOSIS — F0280 Dementia in other diseases classified elsewhere without behavioral disturbance: Secondary | ICD-10-CM | POA: Diagnosis not present

## 2016-06-15 DIAGNOSIS — G20 Parkinson's disease: Secondary | ICD-10-CM | POA: Diagnosis not present

## 2016-06-15 DIAGNOSIS — F411 Generalized anxiety disorder: Secondary | ICD-10-CM | POA: Diagnosis not present

## 2016-06-15 DIAGNOSIS — D519 Vitamin B12 deficiency anemia, unspecified: Secondary | ICD-10-CM | POA: Diagnosis not present

## 2016-06-15 DIAGNOSIS — E440 Moderate protein-calorie malnutrition: Secondary | ICD-10-CM | POA: Diagnosis not present

## 2016-06-17 DIAGNOSIS — E440 Moderate protein-calorie malnutrition: Secondary | ICD-10-CM | POA: Diagnosis not present

## 2016-06-17 DIAGNOSIS — G20 Parkinson's disease: Secondary | ICD-10-CM | POA: Diagnosis not present

## 2016-06-17 DIAGNOSIS — F0280 Dementia in other diseases classified elsewhere without behavioral disturbance: Secondary | ICD-10-CM | POA: Diagnosis not present

## 2016-06-17 DIAGNOSIS — D519 Vitamin B12 deficiency anemia, unspecified: Secondary | ICD-10-CM | POA: Diagnosis not present

## 2016-06-17 DIAGNOSIS — F331 Major depressive disorder, recurrent, moderate: Secondary | ICD-10-CM | POA: Diagnosis not present

## 2016-06-17 DIAGNOSIS — F411 Generalized anxiety disorder: Secondary | ICD-10-CM | POA: Diagnosis not present

## 2016-06-25 DIAGNOSIS — F411 Generalized anxiety disorder: Secondary | ICD-10-CM | POA: Diagnosis not present

## 2016-06-25 DIAGNOSIS — F0280 Dementia in other diseases classified elsewhere without behavioral disturbance: Secondary | ICD-10-CM | POA: Diagnosis not present

## 2016-06-25 DIAGNOSIS — E440 Moderate protein-calorie malnutrition: Secondary | ICD-10-CM | POA: Diagnosis not present

## 2016-06-25 DIAGNOSIS — F331 Major depressive disorder, recurrent, moderate: Secondary | ICD-10-CM | POA: Diagnosis not present

## 2016-06-25 DIAGNOSIS — D519 Vitamin B12 deficiency anemia, unspecified: Secondary | ICD-10-CM | POA: Diagnosis not present

## 2016-06-25 DIAGNOSIS — G20 Parkinson's disease: Secondary | ICD-10-CM | POA: Diagnosis not present

## 2016-07-07 DIAGNOSIS — D519 Vitamin B12 deficiency anemia, unspecified: Secondary | ICD-10-CM | POA: Diagnosis not present

## 2016-07-13 DIAGNOSIS — Z961 Presence of intraocular lens: Secondary | ICD-10-CM | POA: Diagnosis not present

## 2016-07-14 ENCOUNTER — Inpatient Hospital Stay (HOSPITAL_COMMUNITY)
Admission: EM | Admit: 2016-07-14 | Discharge: 2016-07-18 | DRG: 470 | Disposition: A | Discharge status: Skilled Nursing Facility | Payer: Medicare Other | Attending: Internal Medicine | Admitting: Internal Medicine

## 2016-07-14 ENCOUNTER — Emergency Department (HOSPITAL_COMMUNITY): Payer: Medicare Other

## 2016-07-14 ENCOUNTER — Telehealth: Payer: Self-pay | Admitting: Neurology

## 2016-07-14 ENCOUNTER — Encounter (HOSPITAL_COMMUNITY): Payer: Self-pay

## 2016-07-14 DIAGNOSIS — Z96649 Presence of unspecified artificial hip joint: Secondary | ICD-10-CM

## 2016-07-14 DIAGNOSIS — D649 Anemia, unspecified: Secondary | ICD-10-CM | POA: Diagnosis present

## 2016-07-14 DIAGNOSIS — S72002A Fracture of unspecified part of neck of left femur, initial encounter for closed fracture: Principal | ICD-10-CM | POA: Diagnosis present

## 2016-07-14 DIAGNOSIS — Y92002 Bathroom of unspecified non-institutional (private) residence single-family (private) house as the place of occurrence of the external cause: Secondary | ICD-10-CM

## 2016-07-14 DIAGNOSIS — I10 Essential (primary) hypertension: Secondary | ICD-10-CM | POA: Diagnosis not present

## 2016-07-14 DIAGNOSIS — R52 Pain, unspecified: Secondary | ICD-10-CM | POA: Diagnosis not present

## 2016-07-14 DIAGNOSIS — D62 Acute posthemorrhagic anemia: Secondary | ICD-10-CM | POA: Diagnosis not present

## 2016-07-14 DIAGNOSIS — F0280 Dementia in other diseases classified elsewhere without behavioral disturbance: Secondary | ICD-10-CM | POA: Diagnosis not present

## 2016-07-14 DIAGNOSIS — W19XXXA Unspecified fall, initial encounter: Secondary | ICD-10-CM | POA: Diagnosis present

## 2016-07-14 DIAGNOSIS — Z833 Family history of diabetes mellitus: Secondary | ICD-10-CM

## 2016-07-14 DIAGNOSIS — M79652 Pain in left thigh: Secondary | ICD-10-CM | POA: Diagnosis not present

## 2016-07-14 DIAGNOSIS — R2689 Other abnormalities of gait and mobility: Secondary | ICD-10-CM

## 2016-07-14 DIAGNOSIS — Z820 Family history of epilepsy and other diseases of the nervous system: Secondary | ICD-10-CM

## 2016-07-14 DIAGNOSIS — Z96641 Presence of right artificial hip joint: Secondary | ICD-10-CM | POA: Diagnosis not present

## 2016-07-14 DIAGNOSIS — G20 Parkinson's disease: Secondary | ICD-10-CM | POA: Diagnosis not present

## 2016-07-14 DIAGNOSIS — F419 Anxiety disorder, unspecified: Secondary | ICD-10-CM | POA: Diagnosis present

## 2016-07-14 DIAGNOSIS — M79605 Pain in left leg: Secondary | ICD-10-CM | POA: Diagnosis not present

## 2016-07-14 DIAGNOSIS — M6281 Muscle weakness (generalized): Secondary | ICD-10-CM

## 2016-07-14 DIAGNOSIS — S72009A Fracture of unspecified part of neck of unspecified femur, initial encounter for closed fracture: Secondary | ICD-10-CM

## 2016-07-14 DIAGNOSIS — W010XXA Fall on same level from slipping, tripping and stumbling without subsequent striking against object, initial encounter: Secondary | ICD-10-CM | POA: Diagnosis present

## 2016-07-14 DIAGNOSIS — S32511A Fracture of superior rim of right pubis, initial encounter for closed fracture: Secondary | ICD-10-CM | POA: Diagnosis not present

## 2016-07-14 HISTORY — DX: Unspecified dementia, unspecified severity, without behavioral disturbance, psychotic disturbance, mood disturbance, and anxiety: F03.90

## 2016-07-14 LAB — CBC WITH DIFFERENTIAL/PLATELET
BASOS ABS: 0 10*3/uL (ref 0.0–0.1)
BASOS PCT: 0 %
EOS ABS: 0.1 10*3/uL (ref 0.0–0.7)
EOS PCT: 1 %
HEMATOCRIT: 36.3 % (ref 36.0–46.0)
Hemoglobin: 12.3 g/dL (ref 12.0–15.0)
Lymphocytes Relative: 17 %
Lymphs Abs: 1.7 10*3/uL (ref 0.7–4.0)
MCH: 33.2 pg (ref 26.0–34.0)
MCHC: 33.9 g/dL (ref 30.0–36.0)
MCV: 97.8 fL (ref 78.0–100.0)
MONO ABS: 0.7 10*3/uL (ref 0.1–1.0)
MONOS PCT: 8 %
Neutro Abs: 7.1 10*3/uL (ref 1.7–7.7)
Neutrophils Relative %: 74 %
PLATELETS: 281 10*3/uL (ref 150–400)
RBC: 3.71 MIL/uL — ABNORMAL LOW (ref 3.87–5.11)
RDW: 12.8 % (ref 11.5–15.5)
WBC: 9.7 10*3/uL (ref 4.0–10.5)

## 2016-07-14 LAB — BASIC METABOLIC PANEL
ANION GAP: 8 (ref 5–15)
BUN: 21 mg/dL — ABNORMAL HIGH (ref 6–20)
CALCIUM: 9.3 mg/dL (ref 8.9–10.3)
CO2: 26 mmol/L (ref 22–32)
CREATININE: 0.85 mg/dL (ref 0.44–1.00)
Chloride: 106 mmol/L (ref 101–111)
Glucose, Bld: 118 mg/dL — ABNORMAL HIGH (ref 65–99)
Potassium: 3.9 mmol/L (ref 3.5–5.1)
SODIUM: 140 mmol/L (ref 135–145)

## 2016-07-14 MED ORDER — MEMANTINE HCL 5 MG PO TABS: 5.0000 mg | ORAL_TABLET | Freq: Two times a day (BID) | ORAL | 5 refills | Status: AC

## 2016-07-14 MED ORDER — FENTANYL CITRATE (PF) 100 MCG/2ML IJ SOLN
50.0000 ug | INTRAMUSCULAR | Status: DC | PRN
  Administered 2016-07-15: 50 ug via INTRAVENOUS
  Filled 2016-07-14: qty 2

## 2016-07-14 MED ORDER — FENTANYL CITRATE (PF) 100 MCG/2ML IJ SOLN
50.0000 ug | Freq: Once | INTRAMUSCULAR | Status: AC
  Administered 2016-07-14: 50 ug via INTRAVENOUS
  Filled 2016-07-14: qty 2

## 2016-07-14 NOTE — Telephone Encounter (Signed)
I called the daughter. The patient went off of the namenda, but her mentation has worsened. The daughter questions whether the patient was taking the medications properly, this could be a factor in her increased confusion as well.  Daughter has now taking control of the medications and she is monitoring the patient closely in this regard.  We will restart the Namenda taking 5 mg twice daily.

## 2016-07-14 NOTE — Addendum Note (Signed)
Addended by: Stephanie AcreWILLIS, Lachlan Mckim on: 07/14/2016 06:18 PM   Modules accepted: Orders

## 2016-07-14 NOTE — ED Triage Notes (Signed)
Pt states she was sitting on a stool when she fell, landing on her left side, having pain to left hip

## 2016-07-14 NOTE — ED Provider Notes (Signed)
[facility]-EMERGENCY DEPT Provider Note   CSN: [id number] Arrival date & time: 07/14/16  2156  By signing my name below, I, Nelwyn SalisburyJoshua Fowler, attest that this documentation has been prepared under the direction and in the presence of Marily MemosJason Denia Mcvicar, MD . Electronically Signed: Nelwyn SalisburyJoshua Fowler, Scribe. 07/14/2016. 10:02 PM.  History   Chief Complaint Chief Complaint  Patient presents with  . Fall    left hip pain   The history is provided by the patient and the EMS personnel. No language interpreter was used.    HPI Comments:  [name] Oliver is a 83 y.o. female with pmhx of hip fractures brought in by ambulance to the Emergency Department complaining of constant, moderate left hip pain s/p fall occurring just PTA. Pt states that she was sitting on a stool when she lost her balance and fell. She was given 2 of morphine by EMS PTA with some relief. Pt denies any chest pain, headache, neck pain or syncope.     Past Medical History:  Diagnosis Date  . Chronic cough   . Hip fracture (HCC)   . Memory difficulty 01/08/2014  . Pelvis fracture (HCC)   . Primary Parkinsonism [facility](HCC)     Patient Active Problem List   Diagnosis Date Noted  . Femoral neck fracture (HCC) 07/15/2016  . Chronic fatigue 06/14/2016  . Memory difficulty 01/08/2014  . Parkinson's disease dementia (HCC) 10/28/2013  . Abnormality of gait 09/26/2013  . Parkinsonism (HCC) 09/26/2013  . UTI (urinary tract infection) 09/24/2013  . Urinary retention 09/23/2013  . Unspecified constipation 09/23/2013  . Accelerated hypertension 09/20/2013  . Fall 09/19/2013  . Pelvic fracture (HCC) 09/19/2013  . Leukocytosis 09/19/2013    Past Surgical History:  Procedure Laterality Date  . ABDOMINAL HYSTERECTOMY    . ABDOMINAL HYSTERECTOMY    . BREAST LUMPECTOMY Right   . CATARACT EXTRACTION Bilateral    03/14/13  . FRACTURE SURGERY    . JOINT REPLACEMENT    . TOTAL HIP ARTHROPLASTY Right     OB History    Gravida Para Term Preterm AB Living    3 2 2   1      SAB TAB Ectopic Multiple Live Births   1               Home Medications    Prior to Admission medications   Medication Sig Start Date End Date Taking? Authorizing Provider  carbidopa-levodopa (SINEMET IR) 25-100 MG tablet take 1 tablet by mouth twice a day 03/18/16  Yes York Spanielharles K Willis, MD  Cranberry Fruit 475 MG CAPS Take 1 capsule by mouth 2 (two) times daily.   Yes Historical Provider, MD  memantine (NAMENDA) 5 MG tablet Take 1 tablet (5 mg total) by mouth 2 (two) times daily. 07/14/16  Yes York Spanielharles K Willis, MD  mirtazapine (REMERON) 30 MG tablet Take 1 tablet (30 mg total) by mouth at bedtime. 11/10/15  Yes York Spanielharles K Willis, MD  ALPRAZolam Prudy Feeler(XANAX) 0.25 MG tablet  05/11/16   Historical Provider, MD  cyanocobalamin (,VITAMIN B-12,) 1000 MCG/ML injection Inject 1 mL (1,000 mcg total) into the muscle every 30 (thirty) days. 03/11/16   York Spanielharles K Willis, MD    Family History Family History  Problem Relation Age of Onset  . Dementia Mother   . Bipolar disorder Sister   . Parkinsonism Sister   . Diabetes Other     Social History Social History  Substance Use Topics  . Smoking status: Never Smoker  . Smokeless tobacco: Never  Used  . Alcohol use No     Allergies   Morphine and related; Prednisone; and Namzaric [memantine hcl-donepezil hcl]   Review of Systems Review of Systems  Cardiovascular: Negative for chest pain.  Musculoskeletal: Positive for arthralgias. Negative for neck pain.  Neurological: Negative for syncope and headaches.  All other systems reviewed and are negative.    Physical Exam Updated Vital Signs BP 156/76 (BP Location: Right Arm)   Pulse 92   Resp 16   SpO2 94%   Physical Exam  Constitutional: She is oriented to person, place, and time. She appears well-developed and well-nourished. No distress.  HENT:  Head: Normocephalic and atraumatic.  Eyes: Conjunctivae are normal.  Cardiovascular: Normal rate, regular rhythm and normal  heart sounds.   Pulmonary/Chest: Effort normal and breath sounds normal. She has no wheezes. She has no rales.  Abdominal: She exhibits no distension.  Musculoskeletal:  Tenderness over left hip with no pelvic pain. No cervical pain. No obvious deformity or laceration to head.  Neurological: She is alert and oriented to person, place, and time. No cranial nerve deficit.  CN intact  Skin: Skin is warm and dry.  Psychiatric: She has a normal mood and affect.  Nursing note and vitals reviewed.    ED Treatments / Results  DIAGNOSTIC STUDIES:  Oxygen Saturation is 98% on room air, normal by my interpretation.    COORDINATION OF CARE:  12:27 AM Discussed treatment plan with pt at bedside and pt agreed to plan.  Labs (all labs ordered are listed, but only abnormal results are displayed) Labs Reviewed  CBC WITH DIFFERENTIAL/PLATELET - Abnormal; Notable for the following:       Result Value   RBC 3.71 (*)    All other components within normal limits  BASIC METABOLIC PANEL - Abnormal; Notable for the following:    Glucose, Bld 118 (*)    BUN 21 (*)    All other components within normal limits    EKG  EKG Interpretation  Date/Time:  Wednesday July 14 2016 22:14:06 EST Ventricular Rate:  94 PR Interval:    QRS Duration: 114 QT Interval:  365 QTC Calculation: 457 R Axis:   -52 Text Interpretation:  Sinus rhythm Incomplete RBBB and LAFB Artifact in lead(s) I III aVR aVL aVF V1 Confirmed by [facility] MD, Barbara Cower [phone]) on 07/14/2016 10:52:17 PM       Radiology Dg Hip Unilat W Or Wo Pelvis 2-3 Views Left  Result Date: 07/14/2016 CLINICAL DATA:  Hip pain with injury EXAM: DG HIP (WITH OR WITHOUT PELVIS) 2-3V LEFT COMPARISON:  None. FINDINGS: The patient is status post right hip replacement without acute dislocation. Apparent old right inferior pubic ramus fracture. Acute left femoral neck fracture with close to 1/2 bone with of superior displacement of distal fracture fragment. Left  femoral head projects in joint. IMPRESSION: 1. Acute displaced left femoral neck fracture. Left femoral head projects in joint. 2. Status post right hip replacement without acute dislocation. Apparent old fracture deformity of the right inferior ramus. Electronically Signed   By: Jasmine Pang M.D.   On: 07/14/2016 23:01   Dg Femur Min 2 Views Left  Result Date: 07/14/2016 CLINICAL DATA:  Hip pain EXAM: LEFT FEMUR 2 VIEWS COMPARISON:  07/14/2016 radiographs FINDINGS: The distal femur is imaged. No fracture or malalignment of the distal femur. IMPRESSION: Negative.  Please see separately dictated left hip report Electronically Signed   By: Jasmine Pang M.D.   On: 07/14/2016 23:02  Procedures Procedures (including critical care time)  Medications Ordered in ED Medications  fentaNYL (SUBLIMAZE) injection 50 mcg (50 mcg Intravenous Given 07/15/16 0020)  fentaNYL (SUBLIMAZE) injection 50 mcg (50 mcg Intravenous Given 07/14/16 2209)     Initial Impression / Assessment and Plan / ED Course  I have reviewed the triage vital signs and the nursing notes.  Pertinent labs & imaging results that were available during my care of the patient were reviewed by me and considered in my medical decision making (see chart for details).     83 yo F w/ left femoral neck fracture after a fall off of a stool. D/w Dr. Romeo Apple who agreed to him or Dr. Hilda Lias seeing patient in morning and planning surgery. D/w Dr. Sharl Ma, [facility], who will admit.   Final Clinical Impressions(s) / ED Diagnoses   Final diagnoses:  Closed fracture of neck of left femur, initial encounter (HCC)   I personally performed the services described in this documentation, which was scribed in my presence. The recorded information has been reviewed and is accurate.       Marily Memos, MD 07/15/16 [id number]

## 2016-07-14 NOTE — Telephone Encounter (Signed)
Dr. Willis, please advise.  

## 2016-07-14 NOTE — Telephone Encounter (Signed)
Pt's daughter called said the pt has taken herself off namenda. Daughter is wanting her taking namenda, please call and clarify. Daughter is leaving today to return home which is 2 hours away.

## 2016-07-15 ENCOUNTER — Observation Stay (HOSPITAL_COMMUNITY): Payer: Medicare Other

## 2016-07-15 ENCOUNTER — Inpatient Hospital Stay (HOSPITAL_COMMUNITY): Payer: Medicare Other

## 2016-07-15 ENCOUNTER — Inpatient Hospital Stay (HOSPITAL_COMMUNITY): Payer: Medicare Other | Admitting: Anesthesiology

## 2016-07-15 ENCOUNTER — Encounter (HOSPITAL_COMMUNITY)
Admission: EM | Disposition: A | Discharge status: Skilled Nursing Facility | Payer: Self-pay | Source: Home / Self Care | Attending: Internal Medicine

## 2016-07-15 ENCOUNTER — Encounter (HOSPITAL_COMMUNITY): Payer: Self-pay

## 2016-07-15 DIAGNOSIS — R262 Difficulty in walking, not elsewhere classified: Secondary | ICD-10-CM | POA: Diagnosis not present

## 2016-07-15 DIAGNOSIS — S299XXA Unspecified injury of thorax, initial encounter: Secondary | ICD-10-CM | POA: Diagnosis not present

## 2016-07-15 DIAGNOSIS — Z833 Family history of diabetes mellitus: Secondary | ICD-10-CM | POA: Diagnosis not present

## 2016-07-15 DIAGNOSIS — Z9181 History of falling: Secondary | ICD-10-CM | POA: Diagnosis not present

## 2016-07-15 DIAGNOSIS — W19XXXA Unspecified fall, initial encounter: Secondary | ICD-10-CM | POA: Diagnosis not present

## 2016-07-15 DIAGNOSIS — F411 Generalized anxiety disorder: Secondary | ICD-10-CM | POA: Diagnosis not present

## 2016-07-15 DIAGNOSIS — W010XXA Fall on same level from slipping, tripping and stumbling without subsequent striking against object, initial encounter: Secondary | ICD-10-CM | POA: Diagnosis present

## 2016-07-15 DIAGNOSIS — F419 Anxiety disorder, unspecified: Secondary | ICD-10-CM | POA: Diagnosis not present

## 2016-07-15 DIAGNOSIS — F0280 Dementia in other diseases classified elsewhere without behavioral disturbance: Secondary | ICD-10-CM | POA: Diagnosis not present

## 2016-07-15 DIAGNOSIS — M6281 Muscle weakness (generalized): Secondary | ICD-10-CM | POA: Diagnosis not present

## 2016-07-15 DIAGNOSIS — Y92002 Bathroom of unspecified non-institutional (private) residence single-family (private) house as the place of occurrence of the external cause: Secondary | ICD-10-CM | POA: Diagnosis not present

## 2016-07-15 DIAGNOSIS — S72002D Fracture of unspecified part of neck of left femur, subsequent encounter for closed fracture with routine healing: Secondary | ICD-10-CM | POA: Diagnosis not present

## 2016-07-15 DIAGNOSIS — R278 Other lack of coordination: Secondary | ICD-10-CM | POA: Diagnosis not present

## 2016-07-15 DIAGNOSIS — S72009A Fracture of unspecified part of neck of unspecified femur, initial encounter for closed fracture: Secondary | ICD-10-CM

## 2016-07-15 DIAGNOSIS — D649 Anemia, unspecified: Secondary | ICD-10-CM | POA: Diagnosis not present

## 2016-07-15 DIAGNOSIS — D519 Vitamin B12 deficiency anemia, unspecified: Secondary | ICD-10-CM | POA: Diagnosis not present

## 2016-07-15 DIAGNOSIS — S72002A Fracture of unspecified part of neck of left femur, initial encounter for closed fracture: Principal | ICD-10-CM

## 2016-07-15 DIAGNOSIS — Z96641 Presence of right artificial hip joint: Secondary | ICD-10-CM | POA: Diagnosis not present

## 2016-07-15 DIAGNOSIS — G3183 Dementia with Lewy bodies: Secondary | ICD-10-CM | POA: Diagnosis not present

## 2016-07-15 DIAGNOSIS — D62 Acute posthemorrhagic anemia: Secondary | ICD-10-CM | POA: Diagnosis not present

## 2016-07-15 DIAGNOSIS — Z820 Family history of epilepsy and other diseases of the nervous system: Secondary | ICD-10-CM | POA: Diagnosis not present

## 2016-07-15 DIAGNOSIS — G20 Parkinson's disease: Secondary | ICD-10-CM

## 2016-07-15 HISTORY — PX: HIP ARTHROPLASTY: SHX981

## 2016-07-15 LAB — PREPARE RBC (CROSSMATCH)

## 2016-07-15 LAB — ABO/RH: ABO/RH(D): A NEG

## 2016-07-15 LAB — MRSA PCR SCREENING: MRSA BY PCR: NEGATIVE

## 2016-07-15 SURGERY — HEMIARTHROPLASTY, HIP, DIRECT ANTERIOR APPROACH, FOR FRACTURE
Anesthesia: Spinal | Site: Hip | Laterality: Left

## 2016-07-15 MED ORDER — MORPHINE SULFATE (PF) 2 MG/ML IV SOLN: 0.5000 mg | INTRAVENOUS | Status: DC | PRN

## 2016-07-15 MED ORDER — FENTANYL CITRATE (PF) 100 MCG/2ML IJ SOLN
50.0000 ug | Freq: Once | INTRAMUSCULAR | Status: AC
  Administered 2016-07-15: 50 ug via INTRAVENOUS

## 2016-07-15 MED ORDER — SODIUM CHLORIDE 0.9 % IR SOLN
Status: DC | PRN
  Administered 2016-07-15: 3000 mL

## 2016-07-15 MED ORDER — ONDANSETRON HCL 4 MG PO TABS: 4.0000 mg | ORAL_TABLET | Freq: Four times a day (QID) | ORAL | Status: DC | PRN

## 2016-07-15 MED ORDER — ONDANSETRON HCL 4 MG/2ML IJ SOLN
4.0000 mg | Freq: Four times a day (QID) | INTRAMUSCULAR | Status: DC | PRN
  Administered 2016-07-15: 4 mg via INTRAVENOUS
  Filled 2016-07-15: qty 2

## 2016-07-15 MED ORDER — HYDROMORPHONE HCL 1 MG/ML IJ SOLN: 0.1000 mg | INTRAMUSCULAR | Status: DC | PRN

## 2016-07-15 MED ORDER — ACETAMINOPHEN 10 MG/ML IV SOLN
1000.0000 mg | Freq: Once | INTRAVENOUS | Status: AC
  Administered 2016-07-15: 1000 mg via INTRAVENOUS
  Filled 2016-07-15: qty 100

## 2016-07-15 MED ORDER — POLYETHYLENE GLYCOL 3350 17 G PO PACK: 17.0000 g | PACK | Freq: Every day | ORAL | Status: DC | PRN

## 2016-07-15 MED ORDER — ASPIRIN EC 325 MG PO TBEC
325.0000 mg | DELAYED_RELEASE_TABLET | Freq: Every day | ORAL | Status: DC
  Administered 2016-07-16 – 2016-07-18 (×3): 325 mg via ORAL
  Filled 2016-07-15 (×3): qty 1

## 2016-07-15 MED ORDER — SORBITOL 70 % SOLN: 30.0000 mL | Freq: Every day | Status: DC | PRN

## 2016-07-15 MED ORDER — ACETAMINOPHEN 325 MG PO TABS: 650.0000 mg | ORAL_TABLET | Freq: Four times a day (QID) | ORAL | Status: DC | PRN

## 2016-07-15 MED ORDER — PHENOL 1.4 % MT LIQD: 1.0000 | OROMUCOSAL | Status: DC | PRN

## 2016-07-15 MED ORDER — METOCLOPRAMIDE HCL 10 MG PO TABS: 5.0000 mg | ORAL_TABLET | Freq: Three times a day (TID) | ORAL | Status: DC | PRN

## 2016-07-15 MED ORDER — METOCLOPRAMIDE HCL 5 MG/ML IJ SOLN: 5.0000 mg | Freq: Three times a day (TID) | INTRAMUSCULAR | Status: DC | PRN

## 2016-07-15 MED ORDER — MAGNESIUM CITRATE PO SOLN: 1.0000 | Freq: Once | ORAL | Status: DC | PRN

## 2016-07-15 MED ORDER — CARBIDOPA-LEVODOPA 25-100 MG PO TABS
1.0000 | ORAL_TABLET | Freq: Two times a day (BID) | ORAL | Status: DC
  Administered 2016-07-15 – 2016-07-18 (×7): 1 via ORAL
  Filled 2016-07-15 (×7): qty 1

## 2016-07-15 MED ORDER — CEFAZOLIN SODIUM-DEXTROSE 2-4 GM/100ML-% IV SOLN
2.0000 g | INTRAVENOUS | Status: AC
  Administered 2016-07-15: 2 g via INTRAVENOUS

## 2016-07-15 MED ORDER — CHLORHEXIDINE GLUCONATE 4 % EX LIQD: 60.0000 mL | Freq: Once | CUTANEOUS | Status: DC

## 2016-07-15 MED ORDER — TRAMADOL HCL 50 MG PO TABS
50.0000 mg | ORAL_TABLET | Freq: Once | ORAL | Status: AC
  Administered 2016-07-15: 50 mg via ORAL
  Filled 2016-07-15: qty 1

## 2016-07-15 MED ORDER — MIDAZOLAM HCL 2 MG/2ML IJ SOLN
INTRAMUSCULAR | Status: AC
  Filled 2016-07-15: qty 2

## 2016-07-15 MED ORDER — ACETAMINOPHEN 500 MG PO TABS
1000.0000 mg | ORAL_TABLET | Freq: Four times a day (QID) | ORAL | Status: AC
  Administered 2016-07-15 – 2016-07-16 (×4): 1000 mg via ORAL
  Filled 2016-07-15 (×3): qty 2

## 2016-07-15 MED ORDER — HYDROMORPHONE HCL 1 MG/ML IJ SOLN: 0.2500 mg | INTRAMUSCULAR | Status: DC | PRN

## 2016-07-15 MED ORDER — FENTANYL CITRATE (PF) 100 MCG/2ML IJ SOLN
50.0000 ug | INTRAMUSCULAR | Status: DC | PRN
  Administered 2016-07-15 (×2): 50 ug via INTRAVENOUS
  Filled 2016-07-15 (×3): qty 2

## 2016-07-15 MED ORDER — BUPIVACAINE IN DEXTROSE 0.75-8.25 % IT SOLN
INTRATHECAL | Status: AC
  Filled 2016-07-15: qty 2

## 2016-07-15 MED ORDER — ALPRAZOLAM 0.25 MG PO TABS
0.2500 mg | ORAL_TABLET | Freq: Three times a day (TID) | ORAL | Status: DC | PRN
  Administered 2016-07-15: 0.25 mg via ORAL
  Filled 2016-07-15: qty 1

## 2016-07-15 MED ORDER — MIDAZOLAM HCL 2 MG/2ML IJ SOLN
1.0000 mg | INTRAMUSCULAR | Status: DC
  Administered 2016-07-15: 2 mg via INTRAVENOUS

## 2016-07-15 MED ORDER — DOCUSATE SODIUM 100 MG PO CAPS
100.0000 mg | ORAL_CAPSULE | Freq: Two times a day (BID) | ORAL | Status: DC
  Administered 2016-07-15 – 2016-07-18 (×4): 100 mg via ORAL
  Filled 2016-07-15 (×5): qty 1

## 2016-07-15 MED ORDER — DEXTROSE 5 % IV SOLN
500.0000 mg | Freq: Four times a day (QID) | INTRAVENOUS | Status: DC | PRN
  Administered 2016-07-15: 500 mg via INTRAVENOUS
  Filled 2016-07-15 (×3): qty 5

## 2016-07-15 MED ORDER — BUPIVACAINE-EPINEPHRINE (PF) 0.5% -1:200000 IJ SOLN
INTRAMUSCULAR | Status: AC
  Filled 2016-07-15: qty 30

## 2016-07-15 MED ORDER — HYDROCODONE-ACETAMINOPHEN 5-325 MG PO TABS
1.0000 | ORAL_TABLET | Freq: Four times a day (QID) | ORAL | Status: DC | PRN
  Administered 2016-07-15: 1 via ORAL
  Filled 2016-07-15: qty 1

## 2016-07-15 MED ORDER — 0.9 % SODIUM CHLORIDE (POUR BTL) OPTIME
TOPICAL | Status: DC | PRN
  Administered 2016-07-15: 1000 mL

## 2016-07-15 MED ORDER — FENTANYL CITRATE (PF) 100 MCG/2ML IJ SOLN
25.0000 ug | INTRAMUSCULAR | Status: DC | PRN
  Administered 2016-07-15 (×2): 25 ug via INTRAVENOUS
  Filled 2016-07-15 (×2): qty 2

## 2016-07-15 MED ORDER — ONDANSETRON HCL 4 MG/2ML IJ SOLN: 4.0000 mg | Freq: Four times a day (QID) | INTRAMUSCULAR | Status: DC | PRN

## 2016-07-15 MED ORDER — FENTANYL CITRATE (PF) 100 MCG/2ML IJ SOLN
50.0000 ug | INTRAMUSCULAR | Status: DC | PRN
  Administered 2016-07-15 (×2): 50 ug via INTRAVENOUS
  Filled 2016-07-15 (×2): qty 2

## 2016-07-15 MED ORDER — MENTHOL 3 MG MT LOZG: 1.0000 | LOZENGE | OROMUCOSAL | Status: DC | PRN

## 2016-07-15 MED ORDER — CEFAZOLIN SODIUM-DEXTROSE 2-4 GM/100ML-% IV SOLN
INTRAVENOUS | Status: AC
  Filled 2016-07-15: qty 100

## 2016-07-15 MED ORDER — HYDROCODONE-ACETAMINOPHEN 5-325 MG PO TABS
1.0000 | ORAL_TABLET | ORAL | Status: DC | PRN
  Administered 2016-07-16 – 2016-07-18 (×3): 1 via ORAL
  Filled 2016-07-15 (×3): qty 1

## 2016-07-15 MED ORDER — SODIUM CHLORIDE 0.9 % IV SOLN
Freq: Once | INTRAVENOUS | Status: AC
  Administered 2016-07-15: 09:00:00 via INTRAVENOUS

## 2016-07-15 MED ORDER — FENTANYL CITRATE (PF) 100 MCG/2ML IJ SOLN
INTRAMUSCULAR | Status: DC | PRN
  Administered 2016-07-15: 25 ug via INTRAVENOUS
  Administered 2016-07-15: 25 ug via INTRATHECAL

## 2016-07-15 MED ORDER — METHOCARBAMOL 1000 MG/10ML IJ SOLN
500.0000 mg | Freq: Four times a day (QID) | INTRAVENOUS | Status: DC | PRN
  Filled 2016-07-15: qty 5

## 2016-07-15 MED ORDER — METHOCARBAMOL 500 MG PO TABS
500.0000 mg | ORAL_TABLET | Freq: Four times a day (QID) | ORAL | Status: DC | PRN
  Administered 2016-07-15 – 2016-07-18 (×6): 500 mg via ORAL
  Filled 2016-07-15 (×6): qty 1

## 2016-07-15 MED ORDER — HEPARIN SODIUM (PORCINE) 5000 UNIT/ML IJ SOLN
5000.0000 [IU] | Freq: Three times a day (TID) | INTRAMUSCULAR | Status: DC
  Administered 2016-07-15: 5000 [IU] via SUBCUTANEOUS
  Filled 2016-07-15: qty 1

## 2016-07-15 MED ORDER — PROPOFOL 10 MG/ML IV BOLUS
INTRAVENOUS | Status: AC
  Filled 2016-07-15: qty 20

## 2016-07-15 MED ORDER — LIDOCAINE HCL (PF) 1 % IJ SOLN
INTRAMUSCULAR | Status: AC
  Filled 2016-07-15: qty 5

## 2016-07-15 MED ORDER — FENTANYL CITRATE (PF) 100 MCG/2ML IJ SOLN
25.0000 ug | Freq: Once | INTRAMUSCULAR | Status: AC
  Administered 2016-07-15: 25 ug via INTRAVENOUS

## 2016-07-15 MED ORDER — MIRTAZAPINE 30 MG PO TABS
30.0000 mg | ORAL_TABLET | Freq: Every day | ORAL | Status: DC
  Administered 2016-07-15 – 2016-07-17 (×3): 30 mg via ORAL
  Filled 2016-07-15 (×3): qty 1

## 2016-07-15 MED ORDER — SEVOFLURANE IN SOLN
RESPIRATORY_TRACT | Status: AC
  Filled 2016-07-15: qty 250

## 2016-07-15 MED ORDER — POVIDONE-IODINE 10 % EX SWAB: 2.0000 "application " | Freq: Once | CUTANEOUS | Status: DC

## 2016-07-15 MED ORDER — MEMANTINE HCL 10 MG PO TABS
5.0000 mg | ORAL_TABLET | Freq: Two times a day (BID) | ORAL | Status: DC
  Administered 2016-07-15 – 2016-07-18 (×7): 5 mg via ORAL
  Filled 2016-07-15 (×7): qty 1

## 2016-07-15 MED ORDER — FENTANYL CITRATE (PF) 100 MCG/2ML IJ SOLN
INTRAMUSCULAR | Status: AC
  Filled 2016-07-15: qty 2

## 2016-07-15 MED ORDER — BUPIVACAINE IN DEXTROSE 0.75-8.25 % IT SOLN
INTRATHECAL | Status: DC | PRN
  Administered 2016-07-15: 2 mL via INTRATHECAL

## 2016-07-15 MED ORDER — LACTATED RINGERS IV SOLN
INTRAVENOUS | Status: DC
  Administered 2016-07-15: 13:00:00 via INTRAVENOUS

## 2016-07-15 MED ORDER — PROPOFOL 500 MG/50ML IV EMUL
INTRAVENOUS | Status: DC | PRN
  Administered 2016-07-15: 16:00:00 via INTRAVENOUS
  Administered 2016-07-15: 25 ug/kg/min via INTRAVENOUS

## 2016-07-15 MED ORDER — CEFAZOLIN SODIUM-DEXTROSE 2-4 GM/100ML-% IV SOLN
2.0000 g | Freq: Four times a day (QID) | INTRAVENOUS | Status: AC
  Administered 2016-07-15 – 2016-07-16 (×2): 2 g via INTRAVENOUS
  Filled 2016-07-15 (×2): qty 100

## 2016-07-15 MED ORDER — BUPIVACAINE-EPINEPHRINE (PF) 0.5% -1:200000 IJ SOLN
INTRAMUSCULAR | Status: DC | PRN
  Administered 2016-07-15: 60 mL

## 2016-07-15 SURGICAL SUPPLY — 58 items
BAG HAMPER (MISCELLANEOUS) ×3 IMPLANT
BIT DRILL 2.8X128 (BIT) ×2 IMPLANT
BIT DRILL 2.8X128MM (BIT) ×1
BLADE 10 SAFETY STRL DISP (BLADE) ×3 IMPLANT
BLADE HEX COATED 2.75 (ELECTRODE) ×3 IMPLANT
BLADE SAGITTAL 25.0X1.27X90 (BLADE) ×2 IMPLANT
BLADE SAGITTAL 25.0X1.27X90MM (BLADE) ×1
CAPT HIP HEMI 1 ×3 IMPLANT
CHLORAPREP W/TINT 26ML (MISCELLANEOUS) ×3 IMPLANT
CLOTH BEACON ORANGE TIMEOUT ST (SAFETY) ×3 IMPLANT
COVER LIGHT HANDLE STERIS (MISCELLANEOUS) ×6 IMPLANT
COVER PROBE W GEL 5X96 (DRAPES) ×3 IMPLANT
DECANTER SPIKE VIAL GLASS SM (MISCELLANEOUS) ×6 IMPLANT
DRAPE HIP W/POCKET STRL (DRAPE) ×3 IMPLANT
DRSG MEPILEX BORDER 4X12 (GAUZE/BANDAGES/DRESSINGS) ×3 IMPLANT
ELECT REM PT RETURN 9FT ADLT (ELECTROSURGICAL) ×3
ELECTRODE REM PT RTRN 9FT ADLT (ELECTROSURGICAL) ×1 IMPLANT
FACESHIELD LNG OPTICON STERILE (SAFETY) ×3 IMPLANT
GLOVE BIOGEL M 7.0 STRL (GLOVE) ×3 IMPLANT
GLOVE BIOGEL PI IND STRL 6.5 (GLOVE) ×1 IMPLANT
GLOVE BIOGEL PI IND STRL 7.0 (GLOVE) ×3 IMPLANT
GLOVE BIOGEL PI INDICATOR 6.5 (GLOVE) ×2
GLOVE BIOGEL PI INDICATOR 7.0 (GLOVE) ×6
GLOVE ECLIPSE 6.5 STRL STRAW (GLOVE) ×3 IMPLANT
GLOVE ECLIPSE 7.0 STRL STRAW (GLOVE) ×3 IMPLANT
GLOVE SKINSENSE NS SZ8.0 LF (GLOVE) ×4
GLOVE SKINSENSE STRL SZ8.0 LF (GLOVE) ×2 IMPLANT
GLOVE SS N UNI LF 8.5 STRL (GLOVE) ×3 IMPLANT
GOWN STRL REUS W/TWL LRG LVL3 (GOWN DISPOSABLE) ×12 IMPLANT
GOWN STRL REUS W/TWL XL LVL3 (GOWN DISPOSABLE) ×3 IMPLANT
HANDPIECE INTERPULSE COAX TIP (DISPOSABLE) ×2
INST SET MAJOR BONE (KITS) ×3 IMPLANT
IV NS IRRIG 3000ML ARTHROMATIC (IV SOLUTION) ×3 IMPLANT
KIT BLADEGUARD II DBL (SET/KITS/TRAYS/PACK) ×3 IMPLANT
KIT ROOM TURNOVER APOR (KITS) ×3 IMPLANT
MANIFOLD NEPTUNE II (INSTRUMENTS) ×3 IMPLANT
MARKER SKIN DUAL TIP RULER LAB (MISCELLANEOUS) ×3 IMPLANT
NEEDLE HYPO 21X1.5 SAFETY (NEEDLE) ×3 IMPLANT
NS IRRIG 1000ML POUR BTL (IV SOLUTION) ×3 IMPLANT
PACK TOTAL JOINT (CUSTOM PROCEDURE TRAY) ×3 IMPLANT
PAD ARMBOARD 7.5X6 YLW CONV (MISCELLANEOUS) ×3 IMPLANT
PASSER SUT SWANSON 36MM LOOP (INSTRUMENTS) ×3 IMPLANT
PILLOW HIP ABDUCTION MED (ORTHOPEDIC SUPPLIES) ×3 IMPLANT
PIN STMN SNGL STERILE 9X3.6MM (PIN) IMPLANT
SET BASIN LINEN APH (SET/KITS/TRAYS/PACK) ×3 IMPLANT
SET HNDPC FAN SPRY TIP SCT (DISPOSABLE) ×1 IMPLANT
STAPLER VISISTAT 35W (STAPLE) ×3 IMPLANT
SUT BRALON NAB BRD #1 30IN (SUTURE) ×9 IMPLANT
SUT ETHIBOND 5 LR DA (SUTURE) ×6 IMPLANT
SUT MNCRL 0 VIOLET CTX 36 (SUTURE) ×1 IMPLANT
SUT MON AB 2-0 CT1 36 (SUTURE) ×3 IMPLANT
SUT MONOCRYL 0 CTX 36 (SUTURE) ×2
SUT VIC AB 1 CT1 27 (SUTURE) ×4
SUT VIC AB 1 CT1 27XBRD ANTBC (SUTURE) ×2 IMPLANT
SYR 30ML LL (SYRINGE) ×3 IMPLANT
SYR BULB IRRIGATION 50ML (SYRINGE) ×3 IMPLANT
TRAY FOLEY METER SIL LF 16FR (CATHETERS) ×3 IMPLANT
YANKAUER SUCT 12FT TUBE ARGYLE (SUCTIONS) ×3 IMPLANT

## 2016-07-15 NOTE — Progress Notes (Signed)
Patient admitted to the hospital early this morning by Dr. Sharl MaLama.  Patient seen and examined. She continues to complain of pain in her left hip. She also has a headache. No chest pain or shortness of breath.  Patient was admitted to the hospital after a fall which per daughter was a mechanical fall. She has a history of Parkinson's. She was found to have a left-sided hip fracture. Orthopedics has been consulted for likely plan on surgery. Continue pain management. Physical therapy will see the patient after surgery. She will likely need skilled nursing facility placement.  [name] Oliver

## 2016-07-15 NOTE — Anesthesia Procedure Notes (Signed)
Spinal  Patient location during procedure: OR Start time: 07/15/2016 2:32 PM Preanesthetic Checklist Completed: patient identified, site marked, surgical consent, pre-op evaluation, timeout performed, IV checked, risks and benefits discussed and monitors and equipment checked Spinal Block Patient position: left lateral decubitus Prep: Betadine Patient monitoring: heart rate, cardiac monitor, continuous pulse ox and blood pressure Approach: left paramedian Location: L3-4 Injection technique: single-shot Needle Needle type: Spinocan  Needle gauge: 22 G Needle length: 9 cm Assessment Sensory level: T8 Additional Notes  ATTEMPTS  2 TRAY ZO:[id number] TRAY EXPIRATION DATE:06/06/17 CRNA unsuccessful, secondary to spine curvature.  Dr. Jayme CloudGonzalez in and successful

## 2016-07-15 NOTE — Op Note (Signed)
07/15/2016  4:02 PM  PATIENT:  [name] MccallumNorma Oliver  83 y.o. female  PRE-OPERATIVE DIAGNOSIS:  left femoral neck fracture  POST-OPERATIVE DIAGNOSIS:  left femoral neck fracture  PROCEDURE:  Procedure(s): BIPOLAR REPLACEMENT LEFT HIP (Left)   Size 4 Summit basic press-fit femoral stem, size 46 bipolar head with a 1.5 neck length  Findings oblique fracture left femoral neck displaced, acetabulum normal.  Surgical dictation for  bipolar left hip replacement  The patient was identified in the preoperative holding area. The site was marked. The chart was reviewed and updated. The patient was taken to the operating room for spinal anesthesia. A Foley catheter was inserted. The patient was placed in lateral decubitus position with the operative site up. Stulberg positioner was used to hold the patient in place and axillary roll was provided as needed.  Standard preoperative antibiotics were given per protocol using SCIP recommendations.  After the sterile prep and drape the timeout was completed. All implants were accounted for x-rays were visible and everyone agreed on the surgical site left hip    The incision was made over the greater trochanter and deepened through subcutaneous tissue. The fascia was exposed and incised in line with the skin incision. The greater trochanteric bursa was excised. Blunt dissection was carried out in line of the fibers of the gluteus medius. The gluteus medius was subperiosteally dissected from the greater trochanter in line with the vastus lateralis and included the gluteus minimus. These structures were tagged and retracted proximally. 2    The hip capsule was excised and the hip was exposed. The hip was dislocated anteriorly. A cutting guide was used to perform the proximal neck cut. The fractured head was removed. It measured 46 mm in diameter  The acetabulum was evaluated and there was no significant arthritic changes noted.  We next turned our attention to the  femur. A starter hole reamer was passed followed by box osteotome followed by canal finder.  We broached the canal up to a size 4  We then performed trial reduction with a size 4 femur  Size 46 head with 1.5 neck length.  Leg length was confirmed first followed by shuck test followed by flexion internal rotation test followed by external rotation extension test followed by sleep test.  Once I was satisfied with the reduction and stability the trial components were removed. Drill holes were passed through the greater trochanter and #5 Ethibond suture was passed through the drill holes. The implant was placed. The head was attached and the hip was reduced.  The hip was taken back through the same test as performed during the trial and I was satisfied with the reduction and stability  The wound was irrigated thoroughly. The abductors were repaired including the vastus lateralis with #1 Bralon suture. The hip was then abducted and the fascia was closed with #1 Bralon suture.  Skin was closed with 0 Monocryl suture.  Skin staples were used to reapproximate the skin edges   SURGEON:  Surgeon(s) and Role:    * Vickki HearingStanley E Janicia Monterrosa, MD - Primary  PHYSICIAN ASSISTANT:   ASSISTANTS: [place]ty Ashley   ANESTHESIA:   spinal  EBL:  Total I/O In: 700 [I.V.:700] Out: 600 [Urine:400; Blood:200]  BLOOD ADMINISTERED:none  DRAINS: none   LOCAL MEDICATIONS USED:  MARCAINE     SPECIMEN:  No Specimen  DISPOSITION OF SPECIMEN:  N/A  COUNTS:  YES  TOURNIQUET:  * No tourniquets in log *  DICTATION: .Dragon Dictation  PLAN OF CARE:  Admit to inpatient   PATIENT DISPOSITION:  PACU - hemodynamically stable.   Delay start of Pharmacological VTE agent (>24hrs) due to surgical blood loss or risk of bleeding: yes 07/15/2016  4:02 PM  PATIENT:  [name] Oliver  83 y.o. female  PRE-OPERATIVE DIAGNOSIS:  left femoral neck fracture  POST-OPERATIVE DIAGNOSIS:  left femoral neck fracture  PROCEDURE:   Procedure(s): BIPOLAR REPLACEMENT LEFT HIP (Left)   Size 4 Summit basic press-fit femoral stem, size 46 bipolar head with a 1.5 neck length  Findings oblique fracture left femoral neck displaced, acetabulum normal.  Surgical dictation for  bipolar left hip replacement  The patient was identified in the preoperative holding area. The site was marked. The chart was reviewed and updated. The patient was taken to the operating room for spinal anesthesia. A Foley catheter was inserted. The patient was placed in lateral decubitus position with the operative site up. Stulberg positioner was used to hold the patient in place and axillary roll was provided as needed.  Standard preoperative antibiotics were given per protocol using SCIP recommendations.  After the sterile prep and drape the timeout was completed. All implants were accounted for x-rays were visible and everyone agreed on the surgical site left hip    The incision was made over the greater trochanter and deepened through subcutaneous tissue. The fascia was exposed and incised in line with the skin incision. The greater trochanteric bursa was excised. Blunt dissection was carried out in line of the fibers of the gluteus medius. The gluteus medius was subperiosteally dissected from the greater trochanter in line with the vastus lateralis and included the gluteus minimus. These structures were tagged and retracted proximally. 2    The hip capsule was excised and the hip was exposed. The hip was dislocated anteriorly. A cutting guide was used to perform the proximal neck cut. The fractured head was removed. It measured 46 mm in diameter  The acetabulum was evaluated and there was no significant arthritic changes noted.  We next turned our attention to the femur. A starter hole reamer was passed followed by box osteotome followed by canal finder.  We broached the canal up to a size 4  We then performed trial reduction with a size 4 femur   Size 46 head with 1.5 neck length.  Leg length was confirmed first followed by shuck test followed by flexion internal rotation test followed by external rotation extension test followed by sleep test.  Once I was satisfied with the reduction and stability the trial components were removed. Drill holes were passed through the greater trochanter and #5 Ethibond suture was passed through the drill holes. The implant was placed. The head was attached and the hip was reduced.  The hip was taken back through the same test as performed during the trial and I was satisfied with the reduction and stability  The wound was irrigated thoroughly. The abductors were repaired including the vastus lateralis with #1 Bralon suture. The hip was then abducted and the fascia was closed with #1 Bralon suture.  Skin was closed with 0 Monocryl suture.  Skin staples were used to reapproximate the skin edges   SURGEON:  Surgeon(s) and Role:    * Vickki Hearing, MD - Primary  PHYSICIAN ASSISTANT:   ASSISTANTS: [place]   ANESTHESIA:   spinal  EBL:  Total I/O In: 700 [I.V.:700] Out: 600 [Urine:400; Blood:200]  BLOOD ADMINISTERED:none  DRAINS: none   LOCAL MEDICATIONS USED:  MARCAINE  SPECIMEN:  No Specimen  DISPOSITION OF SPECIMEN:  N/A  COUNTS:  YES  TOURNIQUET:  * No tourniquets in log *  DICTATION: .Dragon Dictation  PLAN OF CARE: Admit to inpatient   PATIENT DISPOSITION:  PACU - hemodynamically stable.   Delay start of Pharmacological VTE agent (>24hrs) due to surgical blood loss or risk of bleeding: yes  [phone]

## 2016-07-15 NOTE — Consult Note (Signed)
Reason for Consult: Left hip fracture Referring Physician: Dr. Pearletha Forge  [name] Oliver is an 83 y.o. female.   Medical history from record  HPI: [name] Oliver is a 83 y.o. female with pmhx of hip fractures brought in by ambulance to the Emergency Department complaining of constant, moderate left hip pain s/p fall occurring just PTA. Pt states that she was sitting on a stool when she lost her balance and fell. She was given 2 of morphine by EMS PTA with some relief. Pt denies any chest pain, headache, neck pain or syncope.    The patient has dementia cannot give a history of the patient's daughter says that she simply fell. She has Parkinson's has poor balance and simply fell. She does indicate she has significant pain despite taking fentanyl last evening. Currently comfortable   Past Medical History:  Diagnosis Date  . Chronic cough   . Dementia   . Hip fracture ([place])   . Memory difficulty 01/08/2014  . Pelvis fracture ([place])   . Primary Parkinsonism [facility])     Past Surgical History:  Procedure Laterality Date  . ABDOMINAL HYSTERECTOMY    . ABDOMINAL HYSTERECTOMY    . BREAST LUMPECTOMY Right   . CATARACT EXTRACTION Bilateral    03/14/13  . FRACTURE SURGERY    . JOINT REPLACEMENT    . TOTAL HIP ARTHROPLASTY Right     Family History  Problem Relation Age of Onset  . Dementia Mother   . Bipolar disorder Sister   . Parkinsonism Sister   . Diabetes Other     Social History:  reports that she has never smoked. She has never used smokeless tobacco. She reports that she does not drink alcohol or use drugs.  Allergies:  Allergies  Allergen Reactions  . Morphine And Related Other (See Comments)    Hallucinations  . Prednisone Other (See Comments)    manic  . Namzaric [Memantine Hcl-Donepezil Hcl] Anxiety    Current Facility-Administered Medications:  .  0.9 %  sodium chloride infusion, , Intravenous, Once, Carole Civil, MD .  ALPRAZolam Duanne Moron) tablet 0.25 mg, 0.25 mg, Oral,  TID PRN, Oswald Hillock, MD .  carbidopa-levodopa (SINEMET IR) 25-100 MG per tablet immediate release 1 tablet, 1 tablet, Oral, BID, Oswald Hillock, MD .  fentaNYL (SUBLIMAZE) injection 50 mcg, 50 mcg, Intravenous, Q2H PRN, Oswald Hillock, MD, 50 mcg at 07/15/16 0530 .  heparin injection 5,000 Units, 5,000 Units, Subcutaneous, Q8H, Oswald Hillock, MD, 5,000 Units at 07/15/16 0139 .  HYDROcodone-acetaminophen (NORCO/VICODIN) 5-325 MG per tablet 1-2 tablet, 1-2 tablet, Oral, Q6H PRN, Oswald Hillock, MD, 1 tablet at 07/15/16 0139 .  memantine (NAMENDA) tablet 5 mg, 5 mg, Oral, BID, Oswald Hillock, MD .  mirtazapine (REMERON) tablet 30 mg, 30 mg, Oral, QHS, Oswald Hillock, MD  Current Outpatient Prescriptions:  .  carbidopa-levodopa (SINEMET IR) 25-100 MG tablet, take 1 tablet by mouth twice a day, Disp: 60 tablet, Rfl: 11 .  Cranberry Fruit 475 MG CAPS, Take 1 capsule by mouth 2 (two) times daily., Disp: , Rfl:  .  cyanocobalamin (,VITAMIN B-12,) 1000 MCG/ML injection, Inject 1 mL (1,000 mcg total) into the muscle every 30 (thirty) days., Disp: 10 mL, Rfl: 3 .  memantine (NAMENDA) 5 MG tablet, Take 1 tablet (5 mg total) by mouth 2 (two) times daily., Disp: 60 tablet, Rfl: 5 .  mirtazapine (REMERON) 30 MG tablet, Take 1 tablet (30 mg total) by mouth at bedtime.,  Disp: 30 tablet, Rfl: 11 .  ALPRAZolam (XANAX) 0.25 MG tablet, , Disp: , Rfl:   Results for orders placed or performed during the hospital encounter of 07/14/16 (from the past 48 hour(s))  CBC with Differential     Status: Abnormal   Collection Time: 07/14/16 10:10 PM  Result Value Ref Range   WBC 9.7 4.0 - 10.5 K/uL   RBC 3.71 (L) 3.87 - 5.11 MIL/uL   Hemoglobin 12.3 12.0 - 15.0 g/dL   HCT 36.3 36.0 - 46.0 %   MCV 97.8 78.0 - 100.0 fL   MCH 33.2 26.0 - 34.0 pg   MCHC 33.9 30.0 - 36.0 g/dL   RDW 12.8 11.5 - 15.5 %   Platelets 281 150 - 400 K/uL   Neutrophils Relative % 74 %   Neutro Abs 7.1 1.7 - 7.7 K/uL   Lymphocytes Relative 17 %   Lymphs  Abs 1.7 0.7 - 4.0 K/uL   Monocytes Relative 8 %   Monocytes Absolute 0.7 0.1 - 1.0 K/uL   Eosinophils Relative 1 %   Eosinophils Absolute 0.1 0.0 - 0.7 K/uL   Basophils Relative 0 %   Basophils Absolute 0.0 0.0 - 0.1 K/uL  Basic metabolic panel     Status: Abnormal   Collection Time: 07/14/16 10:10 PM  Result Value Ref Range   Sodium 140 135 - 145 mmol/L   Potassium 3.9 3.5 - 5.1 mmol/L   Chloride 106 101 - 111 mmol/L   CO2 26 22 - 32 mmol/L   Glucose, Bld 118 (H) 65 - 99 mg/dL   BUN 21 (H) 6 - 20 mg/dL   Creatinine, Ser 0.85 0.44 - 1.00 mg/dL   Calcium 9.3 8.9 - 10.3 mg/dL   GFR calc non Af Amer >60 >60 mL/min   GFR calc Af Amer >60 >60 mL/min    Comment: (NOTE) The eGFR has been calculated using the CKD EPI equation. This calculation has not been validated in all clinical situations. eGFR's persistently <60 mL/min signify possible Chronic Kidney Disease.    Anion gap 8 5 - 15    Chest Portable 1 View  Result Date: 07/15/2016 CLINICAL DATA:  Left hip fracture after a fall. EXAM: PORTABLE CHEST 1 VIEW COMPARISON:  09/19/2013 FINDINGS: Shallow inspiration. Heart size and pulmonary vascularity are normal for technique. No focal airspace disease or consolidation in the lungs. No blunting of costophrenic angles. No pneumothorax. Calcified and tortuous aorta. IMPRESSION: Shallow inspiration.  No evidence of active pulmonary disease. Electronically Signed   By: Lucienne Capers M.D.   On: 07/15/2016 01:36   Dg Hip Unilat W Or Wo Pelvis 2-3 Views Left  Result Date: 07/14/2016 CLINICAL DATA:  Hip pain with injury EXAM: DG HIP (WITH OR WITHOUT PELVIS) 2-3V LEFT COMPARISON:  None. FINDINGS: The patient is status post right hip replacement without acute dislocation. Apparent old right inferior pubic ramus fracture. Acute left femoral neck fracture with close to 1/2 bone with of superior displacement of distal fracture fragment. Left femoral head projects in joint. IMPRESSION: 1. Acute displaced  left femoral neck fracture. Left femoral head projects in joint. 2. Status post right hip replacement without acute dislocation. Apparent old fracture deformity of the right inferior ramus. Electronically Signed   By: Donavan Foil M.D.   On: 07/14/2016 23:01   Dg Femur Min 2 Views Left  Result Date: 07/14/2016 CLINICAL DATA:  Hip pain EXAM: LEFT FEMUR 2 VIEWS COMPARISON:  07/14/2016 radiographs FINDINGS: The distal femur is imaged.  No fracture or malalignment of the distal femur. IMPRESSION: Negative.  Please see separately dictated left hip report Electronically Signed   By: Donavan Foil M.D.   On: 07/14/2016 23:02    Review of Systems  Unable to perform ROS: Dementia  Neurological:       Poor balance and daytime sleepiness and fatigue   Blood pressure 142/66, pulse 92, resp. rate 18, SpO2 97 %. Physical Exam  Constitutional: She is oriented to person, place, and time. She appears well-nourished.  Eyes: Right eye exhibits no discharge. Left eye exhibits no discharge. No scleral icterus.  Neck: Neck supple. No JVD present. No tracheal deviation present.  Cardiovascular: Intact distal pulses.   Respiratory: Effort normal. No stridor.  GI: Soft. She exhibits no distension.  Neurological: She is alert and oriented to person, place, and time. She has normal reflexes. She exhibits normal muscle tone. Coordination normal.  Skin: Skin is warm and dry. No rash noted. No erythema. No pallor.  Psychiatric: She has a normal mood and affect. Her behavior is normal. Thought content normal.  upper extremities: Normal alignment range of motion stability and strength  Lower extremities: Right lower extremity normal alignment stability strength range of motion  Left lower extremity is actually flexed up it's tender to the proximal femur alignment is abnormal motion stability and strength could not be tested because of pain  Assessment/Plan: Left femoral neck fracture    Risks and benefits and  complications explained including alternative treatments. The patient has opted for surgical treatment  Left hip bipolar partial replacement   I discussed this with the patient's daughter and husband present  Clinical issue fractured left hip displaced in an 83 year old female with Parkinson's disease. Options are total hip versus bipolar hip versus no treatment. The pros and cons of the procedure was discussed with total hip high risk of dislocation with Parkinson's disease and dementia and the bipolar providing pain relief immediate weightbearing and low risk of dislocation. Risks and benefits of surgery including pulmonary embolus, bleeding, dislocation, DVT, blood clot, pneumonia, urinary tract infection discussed  Outcome most likely will be pain relief but decreased walking ability  The 2 family members present demonstrated understanding and they agree with left hip partial replacement/bipolar   Arther Abbott 07/15/2016, 7:15 AM

## 2016-07-15 NOTE — H&P (Signed)
TRH H&P    Patient Demographics:    [name] Oliver, is a 83 y.o. female  MRN: [id number]  DOB - 08/16/1932  Admit Date - 07/14/2016  Referring MD/NP/PA: Dr. Erin Hearing  Outpatient Primary MD for the patient is Dwana Melena, MD  Patient coming from: Home  Chief Complaint  Patient presents with  . Fall    left hip pain      HPI:    [name] Oliver  is a 83 y.o. female,With history of Parkinson disease, hip fracture came to hospital after patient fell at home and complained of left hip pain. Patient said that she was about daily bath and fell in the bathroom after she lost her balance. She denies passing out. In the ED patient was found to have left hip fracture. She denies chest pain or shortness of breath. No nausea vomiting or diarrhea. ED physician spoke to orthopedic surgeon, and they will see the patient in a.m.    Review of systems:    In addition to the HPI above,  No Fever-chills, No Headache, No changes with Vision or hearing, No problems swallowing food or Liquids, No Chest pain, Cough or Shortness of Breath, No Abdominal pain, No Nausea or Vomiting, bowel movements are regular, No Blood in stool or Urine, No dysuria, No new skin rashes or bruises, No new weakness, tingling, numbness in any extremity, No recent weight gain or loss, No polyuria, polydypsia or polyphagia, No significant Mental Stressors.  A full 10 point Review of Systems was done, except as stated above, all other Review of Systems were negative.   With Past History of the following :    Past Medical History:  Diagnosis Date  . Chronic cough   . Hip fracture ([facility])   . Memory difficulty 01/08/2014  . Pelvis fracture ([facility])   . Primary Parkinsonism [facility])       Past Surgical History:  Procedure Laterality Date  . ABDOMINAL HYSTERECTOMY    . ABDOMINAL HYSTERECTOMY    . BREAST LUMPECTOMY Right   . CATARACT EXTRACTION Bilateral      03/14/13  . FRACTURE SURGERY    . JOINT REPLACEMENT    . TOTAL HIP ARTHROPLASTY Right       Social History:      Social History  Substance Use Topics  . Smoking status: Never Smoker  . Smokeless tobacco: Never Used  . Alcohol use No       Family History :     Family History  Problem Relation Age of Onset  . Dementia Mother   . Bipolar disorder Sister   . Parkinsonism Sister   . Diabetes Other       Home Medications:   Prior to Admission medications   Medication Sig Start Date End Date Taking? Authorizing Provider  carbidopa-levodopa (SINEMET IR) 25-100 MG tablet take 1 tablet by mouth twice a day 03/18/16  Yes York Spaniel, MD  Cranberry Fruit 475 MG CAPS Take 1 capsule by mouth 2 (two) times daily.   Yes Historical Provider, MD  memantine (  NAMENDA) 5 MG tablet Take 1 tablet (5 mg total) by mouth 2 (two) times daily. 07/14/16  Yes York Spaniel, MD  mirtazapine (REMERON) 30 MG tablet Take 1 tablet (30 mg total) by mouth at bedtime. 11/10/15  Yes York Spaniel, MD  ALPRAZolam Prudy Feeler) 0.25 MG tablet  05/11/16   Historical Provider, MD  cyanocobalamin (,VITAMIN B-12,) 1000 MCG/ML injection Inject 1 mL (1,000 mcg total) into the muscle every 30 (thirty) days. 03/11/16   York Spaniel, MD     Allergies:     Allergies  Allergen Reactions  . Morphine And Related Other (See Comments)    Hallucinations  . Prednisone Other (See Comments)    manic  . Namzaric [Memantine Hcl-Donepezil Hcl] Anxiety     Physical Exam:   Vitals  Blood pressure 156/76, pulse 92, resp. rate 16, SpO2 94 %.  1.  General: Appears in no acute distress  2. Psychiatric:  Intact judgement and  insight, awake alert, oriented x 3.  3. Neurologic: No focal neurological deficits, all cranial nerves intact.Strength 5/5 all 4 extremities, sensation intact all 4 extremities, plantars down going.  4. Eyes :  anicteric sclerae, moist conjunctivae with no lid lag. PERRLA.  5. ENMT:   Oropharynx clear with moist mucous membranes and good dentition  6. Neck:  supple, no cervical lymphadenopathy appriciated, No thyromegaly  7. Respiratory : Normal respiratory effort, good air movement bilaterally,clear to  auscultation bilaterally  8. Cardiovascular : RRR, no gallops, rubs or murmurs, no leg edema  9. Gastrointestinal:  Positive bowel sounds, abdomen soft, non-tender to palpation,no hepatosplenomegaly, no rigidity or guarding       10. Skin:  No cyanosis, normal texture and turgor, no rash, lesions or ulcers  11.Musculoskeletal:  Positive tenderness on palpation in left hip    Data Review:    CBC  Recent Labs Lab 07/14/16 2210  WBC 9.7  HGB 12.3  HCT 36.3  PLT 281  MCV 97.8  MCH 33.2  MCHC 33.9  RDW 12.8  LYMPHSABS 1.7  MONOABS 0.7  EOSABS 0.1  BASOSABS 0.0   ------------------------------------------------------------------------------------------------------------------  Chemistries   Recent Labs Lab 07/14/16 2210  NA 140  K 3.9  CL 106  CO2 26  GLUCOSE 118*  BUN 21*  CREATININE 0.85  CALCIUM 9.3   ------------------------------------------------------------------------------------------------------------------  ------------------------------------------------------------------------------------------------------------------  ---------------------------------------------------------------------------------------------------------------      Imaging Results:    Dg Hip Unilat W Or Wo Pelvis 2-3 Views Left  Result Date: 07/14/2016 CLINICAL DATA:  Hip pain with injury EXAM: DG HIP (WITH OR WITHOUT PELVIS) 2-3V LEFT COMPARISON:  None. FINDINGS: The patient is status post right hip replacement without acute dislocation. Apparent old right inferior pubic ramus fracture. Acute left femoral neck fracture with close to 1/2 bone with of superior displacement of distal fracture fragment. Left femoral head projects in joint. IMPRESSION: 1.  Acute displaced left femoral neck fracture. Left femoral head projects in joint. 2. Status post right hip replacement without acute dislocation. Apparent old fracture deformity of the right inferior ramus. Electronically Signed   By: Jasmine Pang M.D.   On: 07/14/2016 23:01   Dg Femur Min 2 Views Left  Result Date: 07/14/2016 CLINICAL DATA:  Hip pain EXAM: LEFT FEMUR 2 VIEWS COMPARISON:  07/14/2016 radiographs FINDINGS: The distal femur is imaged. No fracture or malalignment of the distal femur. IMPRESSION: Negative.  Please see separately dictated left hip report Electronically Signed   By: Jasmine Pang M.D.   On: 07/14/2016 23:02  My personal review of EKG: Rhythm NSR   Assessment & Plan:    Active Problems:   Femoral neck fracture (H[facility])   Hip fracture (H[facility])   1. Femoral neck fracture-  Xray  of hip shows femoral neck fracture, will place under observation, initiate hip fracture protocol. Orthopedic surgeon has been consulted by ED physician, plan to see patient in a.m. 2. Parkinson's disease-continue Sinemet 3. Dementia- no behavior disturbance, continue Namenda, Remeron, Xanax when necessary   DVT Prophylaxis-   Heparin  AM Labs Ordered, also please review Full Orders  Family Communication: Admission, patients condition and plan of care including tests being ordered have been discussed with the patient and her husband at bedside who indicate understanding and agree with the plan and Code Status.  Code Status:  Full code  Admission status: Observation    Time spent in minutes : 60 minutes   Kataleah Bejar S M.D on 07/15/2016 at 12:33 AM  Between 7am to 7pm - Pager - [phone]. After 7pm go to [email or web address] - password [email or web address]  [facility] - Office  [phone]

## 2016-07-15 NOTE — Anesthesia Postprocedure Evaluation (Signed)
Anesthesia Post Note  Patient: [name] Oliver  Procedure(s) Performed: Procedure(s) (LRB): BIPOLAR REPLACEMENT LEFT HIP (Left)  Patient location during evaluation: PACU Anesthesia Type: Spinal Level of consciousness: awake and alert and confused Pain management: pain level controlled Vital Signs Assessment: post-procedure vital signs reviewed and stable Respiratory status: spontaneous breathing and patient connected to nasal cannula oxygen Cardiovascular status: blood pressure returned to baseline Postop Assessment: no signs of nausea or vomiting Anesthetic complications: no     Last Vitals:  Vitals:   07/15/16 1350 07/15/16 1603  BP: 137/80 130/62  Pulse:    Resp: (!) 21 14  Temp:  36.3 C    Last Pain:  Vitals:   07/15/16 1603  TempSrc:   PainSc: 0-No pain                 Kilea Mccarey

## 2016-07-15 NOTE — Anesthesia Preprocedure Evaluation (Addendum)
Anesthesia Evaluation  Patient identified by MRN, date of birth, ID band Patient awake    Reviewed: Allergy & Precautions, NPO status , Patient's Chart, lab work & pertinent test results  Airway Mallampati: III  TM Distance: >3 FB Neck ROM: Limited   Comment: Very limited neck extension Dental  (+) Edentulous Upper, Partial Lower   Pulmonary  Chronic cough, cxr neg.   breath sounds clear to auscultation       Cardiovascular negative cardio ROS   Rhythm:Regular Rate:Normal     Neuro/Psych Parkinson's Dx with dementia.    GI/Hepatic   Endo/Other    Renal/GU      Musculoskeletal   Abdominal   Peds  Hematology   Anesthesia Other Findings   Reproductive/Obstetrics                            Anesthesia Physical Anesthesia Plan  ASA: III  Anesthesia Plan: Spinal   Post-op Pain Management:    Induction:   Airway Management Planned: Simple Face Mask  Additional Equipment:   Intra-op Plan:   Post-operative Plan:   Informed Consent: I have reviewed the patients History and Physical, chart, labs and discussed the procedure including the risks, benefits and alternatives for the proposed anesthesia with the patient or authorized representative who has indicated his/her understanding and acceptance.     Plan Discussed with:   Anesthesia Plan Comments:         Anesthesia Quick Evaluation

## 2016-07-15 NOTE — Brief Op Note (Signed)
07/14/2016 - 07/15/2016  4:02 PM  PATIENT:  [name] MccallumNorma Oliver  83 y.o. female  PRE-OPERATIVE DIAGNOSIS:  left femoral neck fracture  POST-OPERATIVE DIAGNOSIS:  left femoral neck fracture  PROCEDURE:  Procedure(s): BIPOLAR REPLACEMENT LEFT HIP (Left)   Size 4 Summit basic press-fit femoral stem, size 46 bipolar head with a 1.5 neck length  Findings oblique fracture left femoral neck displaced, acetabulum normal.  Surgical dictation for  bipolar left hip replacement  The patient was identified in the preoperative holding area. The site was marked. The chart was reviewed and updated. The patient was taken to the operating room for spinal anesthesia. A Foley catheter was inserted. The patient was placed in lateral decubitus position with the operative site up. Stulberg positioner was used to hold the patient in place and axillary roll was provided as needed.  Standard preoperative antibiotics were given per protocol using SCIP recommendations.  After the sterile prep and drape the timeout was completed. All implants were accounted for x-rays were visible and everyone agreed on the surgical site left hip    The incision was made over the greater trochanter and deepened through subcutaneous tissue. The fascia was exposed and incised in line with the skin incision. The greater trochanteric bursa was excised. Blunt dissection was carried out in line of the fibers of the gluteus medius. The gluteus medius was subperiosteally dissected from the greater trochanter in line with the vastus lateralis and included the gluteus minimus. These structures were tagged and retracted proximally. 2    The hip capsule was excised and the hip was exposed. The hip was dislocated anteriorly. A cutting guide was used to perform the proximal neck cut. The fractured head was removed. It measured 46 mm in diameter  The acetabulum was evaluated and there was no significant arthritic changes noted.  We next turned our  attention to the femur. A starter hole reamer was passed followed by box osteotome followed by canal finder.  We broached the canal up to a size 4  We then performed trial reduction with a size 4 femur  Size 46 head with 1.5 neck length.  Leg length was confirmed first followed by shuck test followed by flexion internal rotation test followed by external rotation extension test followed by sleep test.  Once I was satisfied with the reduction and stability the trial components were removed. Drill holes were passed through the greater trochanter and #5 Ethibond suture was passed through the drill holes. The implant was placed. The head was attached and the hip was reduced.  The hip was taken back through the same test as performed during the trial and I was satisfied with the reduction and stability  The wound was irrigated thoroughly. The abductors were repaired including the vastus lateralis with #1 Bralon suture. The hip was then abducted and the fascia was closed with #1 Bralon suture.  Skin was closed with 0 Monocryl suture.  Skin staples were used to reapproximate the skin edges   SURGEON:  Surgeon(s) and Role:    * Vickki HearingStanley E Harrison, MD - Primary  PHYSICIAN ASSISTANT:   ASSISTANTS: [place] NationBetty Ashley   ANESTHESIA:   spinal  EBL:  Total I/O In: 700 [I.V.:700] Out: 600 [Urine:400; Blood:200]  BLOOD ADMINISTERED:none  DRAINS: none   LOCAL MEDICATIONS USED:  MARCAINE     SPECIMEN:  No Specimen  DISPOSITION OF SPECIMEN:  N/A  COUNTS:  YES  TOURNIQUET:  * No tourniquets in log *  DICTATION: .Dragon Dictation  PLAN  OF CARE: Admit to inpatient   PATIENT DISPOSITION:  PACU - hemodynamically stable.   Delay start of Pharmacological VTE agent (>24hrs) due to surgical blood loss or risk of bleeding: yes

## 2016-07-15 NOTE — Transfer of Care (Signed)
Immediate Anesthesia Transfer of Care Note  Patient: [name] Oliver  Procedure(s) Performed: Procedure(s): BIPOLAR REPLACEMENT LEFT HIP (Left)  Patient Location: PACU  Anesthesia Type:Spinal  Level of Consciousness: awake  Airway & Oxygen Therapy: Patient Spontanous Breathing and Patient connected to face mask oxygen  Post-op Assessment: Report given to RN and Post -op Vital signs reviewed and stable  Post vital signs: Reviewed and stable  Last Vitals:  Vitals:   07/15/16 1345 07/15/16 1350  BP: 134/78 137/80  Pulse:    Resp: 12 (!) 21  Temp:      Last Pain:  Vitals:   07/15/16 1316  TempSrc: Oral  PainSc: 7          Complications: No apparent anesthesia complications

## 2016-07-15 NOTE — OR Nursing (Signed)
Per sherri in lab  2 units prbc are available for patient

## 2016-07-15 NOTE — OR Nursing (Signed)
Per morgan  PCR  Swab was completed , Chg bath was completed,  Partial removed and taken to PACU,   Unable to remove ring to left hand.

## 2016-07-16 DIAGNOSIS — S72002D Fracture of unspecified part of neck of left femur, subsequent encounter for closed fracture with routine healing: Secondary | ICD-10-CM

## 2016-07-16 DIAGNOSIS — D649 Anemia, unspecified: Secondary | ICD-10-CM

## 2016-07-16 LAB — CBC
HCT: 31.9 % — ABNORMAL LOW (ref 36.0–46.0)
Hemoglobin: 10.7 g/dL — ABNORMAL LOW (ref 12.0–15.0)
MCH: 33.1 pg (ref 26.0–34.0)
MCHC: 33.5 g/dL (ref 30.0–36.0)
MCV: 98.8 fL (ref 78.0–100.0)
PLATELETS: 240 10*3/uL (ref 150–400)
RBC: 3.23 MIL/uL — AB (ref 3.87–5.11)
RDW: 12.6 % (ref 11.5–15.5)
WBC: 9.3 10*3/uL (ref 4.0–10.5)

## 2016-07-16 LAB — BASIC METABOLIC PANEL
Anion gap: 6 (ref 5–15)
BUN: 14 mg/dL (ref 6–20)
CO2: 26 mmol/L (ref 22–32)
CREATININE: 0.69 mg/dL (ref 0.44–1.00)
Calcium: 8.8 mg/dL — ABNORMAL LOW (ref 8.9–10.3)
Chloride: 105 mmol/L (ref 101–111)
GFR calc Af Amer: >60 mL/min (ref >60)
Glucose, Bld: 103 mg/dL — ABNORMAL HIGH (ref 65–99)
Potassium: 3.9 mmol/L (ref 3.5–5.1)
SODIUM: 137 mmol/L (ref 135–145)

## 2016-07-16 NOTE — Evaluation (Signed)
Physical Therapy Evaluation Patient Details Name: [name] Oliver MRN: [id number] DOB: 10/25/1932 Today's Date: 07/16/2016   History of Present Illness  83 y.o. female,With history of Parkinson disease, hip fracture came to hospital after patient fell at home and complained of left hip pain. Patient said that she was about to take her daily bath and fell in the bathroom after she lost her balance.  Pt is now s/p L bipolar hip replacement - lateral approach.   Clinical Impression  Pt received in bed, son initially present but stepped out for mobility portion, and husband arrived at the end.  Pt is agreeable to PT evaluation.  She states she is normally independent with ambulation.  During evaluation today, she demonstrates poor motor planning and poor initiation likely due to her PMH of Parkinson's disease.  She required Max A +2 for supine<>sit, and once sitting she demonstrates a slight posterior lean and requires multiple cues to lean fwd.  She was able to attempt sit<>stand with RW and Max A +2, however she is not able to come into full upright posture, and demonstrates poor weight bearing through any extremity.  Therefore, STEDY was utilized to assist pt with transferring bed <>chair.  Pt is not able to d/c home at this time due to increased need for assistance with all functional mobility.  She will need SNF.    Follow Up Recommendations SNF    Equipment Recommendations  Other (comment) (TBD at SNF)    Recommendations for Other Services       Precautions / Restrictions Precautions Precautions: Fall Restrictions Weight Bearing Restrictions: Yes LLE Weight Bearing: Weight bearing as tolerated      Mobility  Bed Mobility Overal bed mobility: Needs Assistance Bed Mobility: Supine to Sit     Supine to sit: Max assist;+2 for physical assistance;HOB elevated        Transfers Overall transfer level: Needs assistance Equipment used: Rolling walker (2 wheeled) Transfers: Sit to/from  Stand Sit to Stand: Max assist;+2 physical assistance         General transfer comment: x 2 trials of sit<>stand with RW, however pt is not able to obtain full upright posture, and is not even placing weight through R LE (good LE).  She has difficulty placing weight through UE's as well.  Therefore, obtained STEDY, and pt was able to transfer bed<>chair with STEDY.   Ambulation/Gait                Stairs            Wheelchair Mobility    Modified Rankin (Stroke Patients Only)       Balance Overall balance assessment: History of Falls;Needs assistance Sitting-balance support: Bilateral upper extremity supported;Feet supported Sitting balance-Leahy Scale: Poor Sitting balance - Comments: Pt demonstrates posterior lean, and requires multiple cues to use hands to hold onto the edge of the mattress.  Pt also requires visual cues to lean fwd with cues such as "get your nose over your toes/knees" Pt requries assistance to initiate this movement.     Standing balance support: Bilateral upper extremity supported Standing balance-Leahy Scale: Zero                               Pertinent Vitals/Pain Pain Assessment: Faces Faces Pain Scale: Hurts even more Pain Location: L hip Pain Descriptors / Indicators: Grimacing;Guarding Pain Intervention(s): Limited activity within patient's tolerance;Monitored during session;Repositioned    Home Living  Living Arrangements: Spouse/significant other   Type of Home: House Home Access: Stairs to enter   Secretary/administrator of Steps: 1 Home Layout: One level        Prior Function Level of Independence: Independent               Hand Dominance   Dominant Hand: Right    Extremity/Trunk Assessment   Upper Extremity Assessment Upper Extremity Assessment: Generalized weakness    Lower Extremity Assessment Lower Extremity Assessment: Generalized weakness       Communication   Communication: No  difficulties  Cognition Arousal/Alertness: Awake/alert Behavior During Therapy: Flat affect (PMH of Parkinson's and dementia)                   General Comments: Pt had difficulty following commands.  She is able to verbally respond, however she is extremely slow to initate any movement.      General Comments General comments (skin integrity, edema, etc.): Abrasion noted on R shin    Exercises Total Joint Exercises Ankle Circles/Pumps: PROM;Both;10 reps;Supine;Limitations Ankle Circles/Pumps Limitations: Pt does not perform full ROM when asked to perform AP's.  Pt requires assist for passive range of motion to complete full range.  Quad Sets: Limitations Quad Sets Limitations: Attempted to have pt complete, however she is not able to comprehend or initiate quad contraction on the left at all.   Heel Slides: AAROM;Left;5 reps;Limitations Heel Slides Limitations: Pt demonstrates poor initiation with all exercises.     Assessment/Plan    PT Assessment Patient needs continued PT services  PT Problem List Decreased strength;Decreased range of motion;Decreased activity tolerance;Decreased balance;Decreased mobility;Decreased coordination;Decreased cognition;Decreased knowledge of use of DME;Decreased safety awareness;Decreased knowledge of precautions;Decreased skin integrity;Pain          PT Treatment Interventions DME instruction;Gait training;Functional mobility training;Therapeutic activities;Therapeutic exercise;Balance training;Neuromuscular re-education;Cognitive remediation;Patient/family education    PT Goals (Current goals can be found in the Care Plan section)  Acute Rehab PT Goals Patient Stated Goal: To get strong enough to get back home. PT Goal Formulation: With patient Time For Goal Achievement: 07/23/16 Potential to Achieve Goals: Fair    Frequency 7X/week   Barriers to discharge        Co-evaluation PT/OT/SLP Co-Evaluation/Treatment: Yes Reason for  Co-Treatment: Complexity of the patient's impairments (multi-system involvement);Necessary to address cognition/behavior during functional activity PT goals addressed during session: Mobility/safety with mobility;Balance;Proper use of DME;Strengthening/ROM         End of Session Equipment Utilized During Treatment: Gait belt;Other (comment) (STEDY) Activity Tolerance: Patient limited by pain Patient left: in chair;with call bell/phone within reach;with family/visitor present Nurse Communication: Mobility status;Need for lift equipment (Tamika, RN notified of pt's mobility status, and recommendations to use gait belt and STEDY for transfers. )    Functional Assessment Tool Used: [employer] "6-clicks"  Functional Limitation: Mobility: Walking and moving around Mobility: Walking and Moving Around Current Status [phone]): At least 40 percent but less than 60 percent impaired, limited or restricted Mobility: Walking and Moving Around Goal Status [phone]): At least 20 percent but less than 40 percent impaired, limited or restricted    Time: 0846-0925 PT Time Calculation (min) (ACUTE ONLY): 39 min   Charges:   PT Evaluation $PT Eval Low Complexity: 1 Procedure PT Treatments $Therapeutic Activity: 8-22 mins   PT G Codes:   PT G-Codes **NOT FOR INPATIENT CLASS** Functional Assessment Tool Used: [employer] "6-clicks"  Functional Limitation: Mobility: Walking and moving around Mobility: Walking and Moving  Around Current Status ([id number]): At least 40 percent but less than 60 percent impaired, limited or restricted Mobility: Walking and Moving Around Goal Status [phone]): At least 20 percent but less than 40 percent impaired, limited or restricted    Beth Itzayana Pardy, PT, DPT X: [phone]

## 2016-07-16 NOTE — Progress Notes (Signed)
PROGRESS NOTE    [name] Oliver  ZOX:[id number] DOB: September 28, 1932 DOA: 07/14/2016 PCP: Dwana Melena, MD    Brief Narrative:  83 year old female with history of Parkinson's disease and poor balance, presented after a mechanical fall. Have a Left Hip Fracture. Seen by Orthopedics and Underwent Operative Management on 2/8. Plan is for skilled nursing facility placement upon discharge.  Assessment & Plan:   Active Problems:   Fall   Parkinson's disease dementia (HCC)   Femoral neck fracture (HCC)   Hip fracture (HCC)   Closed fracture of neck of left femur (HCC)   1. Left hip fracture. Patient was seen by orthopedics and underwent operative management on 2/8. Will continue with physical therapy. Continue pain management  2. Parkinson's disease. Continue on Sinemet.  3. Anxiety. Continue Xanax when necessary.  4. Anemia. Likely related to blood loss. Continue to monitor   DVT prophylaxis: ASA and SCDs Code Status: full Family Communication: discussed with son and daughter at the bedside Disposition Plan: discharge to SNF when improved   Consultants:   orthopedics  Procedures:  BIPOLAR REPLACEMENT LEFT HIP (Left)   Antimicrobials:      Subjective: Feeling better. She is sore in her left leg, but overall feeling better  Objective: Vitals:   07/15/16 1830 07/15/16 1845 07/15/16 2136 07/16/16 0500  BP: 134/62 125/66 127/64 128/60  Pulse: 79 78 88 80  Resp: 18 19 20 20   Temp: 97.6 F (36.4 C) 97.8 F (36.6 C) 98.2 F (36.8 C) 98.5 F (36.9 C)  TempSrc: Axillary Axillary Oral Oral  SpO2: 97% 97% 96% 98%  Weight:      Height:        Intake/Output Summary (Last 24 hours) at 07/16/16 1430 Last data filed at 07/16/16 0900  Gross per 24 hour  Intake             1640 ml  Output              600 ml  Net             1040 ml   Filed Weights   07/15/16 0925  Weight: 58.5 kg (128 lb 14.4 oz)    Examination:  General exam: Appears calm and comfortable  Respiratory  system: Clear to auscultation. Respiratory effort normal. Cardiovascular system: S1 & S2 heard, RRR. No JVD, murmurs, rubs, gallops or clicks. No pedal edema. Gastrointestinal system: Abdomen is nondistended, soft and nontender. No organomegaly or masses felt. Normal bowel sounds heard. Central nervous system: Alert and oriented. No focal neurological deficits. Extremities: Symmetric 5 x 5 power. Skin: No rashes, lesions or ulcers Psychiatry: Judgement and insight appear normal. Mood & affect appropriate.     Data Reviewed: I have personally reviewed following labs and imaging studies  CBC:  Recent Labs Lab 07/14/16 2210 07/16/16 0433  WBC 9.7 9.3  NEUTROABS 7.1  --   HGB 12.3 10.7*  HCT 36.3 31.9*  MCV 97.8 98.8  PLT 281 240   Basic Metabolic Panel:  Recent Labs Lab 07/14/16 2210 07/16/16 0433  NA 140 137  K 3.9 3.9  CL 106 105  CO2 26 26  GLUCOSE 118* 103*  BUN 21* 14  CREATININE 0.85 0.69  CALCIUM 9.3 8.8*   GFR: Estimated Creatinine Clearance: 45.6 mL/min (by C-G formula based on SCr of 0.69 mg/dL). Liver Function Tests: No results for input(s): AST, ALT, ALKPHOS, BILITOT, PROT, ALBUMIN in the last 168 hours. No results for input(s): LIPASE, AMYLASE in the last 168  hours. No results for input(s): AMMONIA in the last 168 hours. Coagulation Profile: No results for input(s): INR, PROTIME in the last 168 hours. Cardiac Enzymes: No results for input(s): CKTOTAL, CKMB, CKMBINDEX, TROPONINI in the last 168 hours. BNP (last 3 results) No results for input(s): PROBNP in the last 8760 hours. HbA1C: No results for input(s): HGBA1C in the last 72 hours. CBG: No results for input(s): GLUCAP in the last 168 hours. Lipid Profile: No results for input(s): CHOL, HDL, LDLCALC, TRIG, CHOLHDL, LDLDIRECT in the last 72 hours. Thyroid Function Tests: No results for input(s): TSH, T4TOTAL, FREET4, T3FREE, THYROIDAB in the last 72 hours. Anemia Panel: No results for input(s):  VITAMINB12, FOLATE, FERRITIN, TIBC, IRON, RETICCTPCT in the last 72 hours. Sepsis Labs: No results for input(s): PROCALCITON, LATICACIDVEN in the last 168 hours.  Recent Results (from the past 240 hour(s))  MRSA PCR Screening     Status: None   Collection Time: 07/15/16 10:47 AM  Result Value Ref Range Status   MRSA by PCR NEGATIVE NEGATIVE Final    Comment:        The GeneXpert MRSA Assay (FDA approved for NASAL specimens only), is one component of a comprehensive MRSA colonization surveillance program. It is not intended to diagnose MRSA infection nor to guide or monitor treatment for MRSA infections.          Radiology Studies: Dg Pelvis Portable  Result Date: 07/15/2016 CLINICAL DATA:  Status post LEFT hip replacement. LEFT femoral neck fracture. EXAM: PORTABLE PELVIS 1-2 VIEWS COMPARISON:  07/14/2016. FINDINGS: Patient is status post open reduction internal fixation of a LEFT femoral neck fracture with a bipolar prosthesis. Satisfactory position and alignment. Remote RIGHT THA. IMPRESSION: No adverse features. Electronically Signed   By: Elsie Stain M.D.   On: 07/15/2016 16:34   Chest Portable 1 View  Result Date: 07/15/2016 CLINICAL DATA:  Left hip fracture after a fall. EXAM: PORTABLE CHEST 1 VIEW COMPARISON:  09/19/2013 FINDINGS: Shallow inspiration. Heart size and pulmonary vascularity are normal for technique. No focal airspace disease or consolidation in the lungs. No blunting of costophrenic angles. No pneumothorax. Calcified and tortuous aorta. IMPRESSION: Shallow inspiration.  No evidence of active pulmonary disease. Electronically Signed   By: Burman Nieves M.D.   On: 07/15/2016 01:36   Dg Hip Unilat W Or Wo Pelvis 2-3 Views Left  Result Date: 07/14/2016 CLINICAL DATA:  Hip pain with injury EXAM: DG HIP (WITH OR WITHOUT PELVIS) 2-3V LEFT COMPARISON:  None. FINDINGS: The patient is status post right hip replacement without acute dislocation. Apparent old right  inferior pubic ramus fracture. Acute left femoral neck fracture with close to 1/2 bone with of superior displacement of distal fracture fragment. Left femoral head projects in joint. IMPRESSION: 1. Acute displaced left femoral neck fracture. Left femoral head projects in joint. 2. Status post right hip replacement without acute dislocation. Apparent old fracture deformity of the right inferior ramus. Electronically Signed   By: Jasmine Pang M.D.   On: 07/14/2016 23:01   Dg Femur Min 2 Views Left  Result Date: 07/14/2016 CLINICAL DATA:  Hip pain EXAM: LEFT FEMUR 2 VIEWS COMPARISON:  07/14/2016 radiographs FINDINGS: The distal femur is imaged. No fracture or malalignment of the distal femur. IMPRESSION: Negative.  Please see separately dictated left hip report Electronically Signed   By: Jasmine Pang M.D.   On: 07/14/2016 23:02        Scheduled Meds: . aspirin EC  325 mg Oral Q breakfast  .  carbidopa-levodopa  1 tablet Oral BID  . docusate sodium  100 mg Oral BID  . memantine  5 mg Oral BID  . mirtazapine  30 mg Oral QHS   Continuous Infusions:   LOS: 1 day    Time spent: 25mins    Densil Ottey, MD [facility] Pager [phone]  If 7PM-7AM, please contact night-coverage [email or web address] Password [email or web address] 07/16/2016, 2:30 PM

## 2016-07-16 NOTE — Evaluation (Signed)
Occupational Therapy Evaluation Patient Details Name: [name] Oliver MRN: [id number] DOB: 11/12/1932 Today's Date: 07/16/2016    History of Present Illness 83 y.o. female,With history of Parkinson disease, hip fracture came to hospital after patient fell at home and complained of left hip pain. Patient said that she was about to take her daily bath and fell in the bathroom after she lost her balance.  Pt is now s/p L bipolar hip replacement - lateral approach.    Clinical Impression   Pt awake, alert, son present at beginning of evaluation. Pt reports independence with ADL completion and functional mobility, does require assistance with donning undergarments at times. Pt requires max assist with ADL completion at this time, as well as Max A +2 for bed mobility and transfer tasks. Pt required use of STEDY for transferring bed<>chair due to inability to stand upright with sit<>stand. Recommend SNF on discharge due to increased assistance required for all ADL and functional mobility completion. Husband and pt agreeable to SNF.      Follow Up Recommendations  SNF    Equipment Recommendations  None recommended by OT       Precautions / Restrictions Precautions Precautions: Fall Restrictions Weight Bearing Restrictions: Yes LLE Weight Bearing: Weight bearing as tolerated      Mobility Bed Mobility Overal bed mobility: Needs Assistance Bed Mobility: Supine to Sit     Supine to sit: Max assist;+2 for physical assistance;HOB elevated        Transfers Overall transfer level: Needs assistance Equipment used: Rolling walker (2 wheeled) Transfers: Sit to/from Stand Sit to Stand: Max assist;+2 physical assistance         General transfer comment: x 2 trials of sit<>stand with RW, however pt is not able to obtain full upright posture, and is not even placing weight through R LE (good LE).  She has difficulty placing weight through UE's as well.  Therefore, obtained STEDY, and pt was  able to transfer bed<>chair with STEDY.          ADL Overall ADL's : Needs assistance/impaired Eating/Feeding: Set up;Cueing for sequencing;Sitting                   Lower Body Dressing: Total assistance;Bed level                                 Pertinent Vitals/Pain Pain Assessment: Faces Faces Pain Scale: Hurts even more Pain Location: L hip Pain Descriptors / Indicators: Grimacing;Guarding Pain Intervention(s): Limited activity within patient's tolerance;Monitored during session;Repositioned     Hand Dominance Right   Extremity/Trunk Assessment Upper Extremity Assessment Upper Extremity Assessment: Generalized weakness   Lower Extremity Assessment Lower Extremity Assessment: Defer to PT evaluation       Communication Communication Communication: No difficulties   Cognition Arousal/Alertness: Awake/alert Behavior During Therapy: Flat affect (PMH of Parkinson's and dementia)                   General Comments: Pt had difficulty following commands.  She is able to verbally respond, however she is extremely slow to initate any movement.                Home Living   Living Arrangements: Spouse/significant other   Type of Home: House Home Access: Stairs to enter Entrance Stairs-Number of Steps: 1   Home Layout: One level     Bathroom Shower/Tub: Chief Strategy OfficerTub/shower unit   Bathroom Toilet:  Standard                Prior Functioning/Environment Level of Independence: Independent                 OT Problem List: Decreased strength;Decreased activity tolerance;Impaired balance (sitting and/or standing);Decreased cognition;Decreased safety awareness;Impaired UE functional use;Pain                    Co-evaluation PT/OT/SLP Co-Evaluation/Treatment: Yes Reason for Co-Treatment: Complexity of the patient's impairments (multi-system involvement);Necessary to address cognition/behavior during functional activity;For  patient/therapist safety;To address functional/ADL transfers PT goals addressed during session: Mobility/safety with mobility;Balance;Proper use of DME;Strengthening/ROM OT goals addressed during session: ADL's and self-care;Other (comment) (functional transfers)      End of Session Equipment Utilized During Treatment: Gait belt;Rolling walker (stedy ) Nurse Communication: Mobility status  Activity Tolerance: Patient tolerated treatment well Patient left: in chair;with call bell/phone within reach;with family/visitor present;with chair alarm set   Time: 1610-9604 OT Time Calculation (min): 37 min Charges:  OT General Charges $OT Visit: 1 Procedure OT Evaluation $OT Eval Low Complexity: 1 Procedure Ezra Sites, OTR/L  [phone] 07/16/2016, 11:04 AM

## 2016-07-16 NOTE — NC FL2 (Signed)
[place] MEDICAID FL2 LEVEL OF CARE SCREENING TOOL     IDENTIFICATION  Patient Name: [name] Oliver Showers Birthdate: 12/06/1932 Sex: female Admission Date (Current Location): 07/14/2016  [facility] [place] [place]   Facility and Address:  [facility],  [address], [place] [id number]      Provider Number: [id number]  Attending Physician Name and Address:  Erick BlinksJehanzeb Memon, MD  Relative Name and Phone Number:       Current Level of Care: Hospital Recommended Level of Care: Skilled Nursing Facility Prior Approval Number:    Date Approved/Denied:   PASRR Number: [id number]  Discharge Plan: SNF    Current Diagnoses: Patient Active Problem List   Diagnosis Date Noted  . Femoral neck fracture (HCC) 07/15/2016  . Hip fracture (HCC) 07/15/2016  . Closed fracture of neck of left femur (HCC)   . Chronic fatigue 06/14/2016  . Memory difficulty 01/08/2014  . Parkinson's disease dementia (HCC) 10/28/2013  . Abnormality of gait 09/26/2013  . Parkinsonism (HCC) 09/26/2013  . UTI (urinary tract infection) 09/24/2013  . Urinary retention 09/23/2013  . Unspecified constipation 09/23/2013  . Accelerated hypertension 09/20/2013  . Fall 09/19/2013  . Pelvic fracture (HCC) 09/19/2013  . Leukocytosis 09/19/2013    Orientation RESPIRATION BLADDER Height & Weight     Self, Time, Situation, Place  Normal Continent Weight: 128 lb 14.4 oz (58.5 kg) Height:  5\' 3"  (160 cm)  BEHAVIORAL SYMPTOMS/MOOD NEUROLOGICAL BOWEL NUTRITION STATUS  Other (Comment) (none)  (n/a) Continent Diet (Heart healthy)  AMBULATORY STATUS COMMUNICATION OF NEEDS Skin   Total Care Verbally Surgical wounds, Bruising                       Personal Care Assistance Level of Assistance  Bathing, Feeding, Dressing Bathing Assistance: Maximum assistance Feeding assistance: Limited assistance Dressing Assistance: Maximum assistance     Functional Limitations Info  Sight, Hearing, Speech  Sight Info: Adequate Hearing Info: Adequate Speech Info: Adequate    SPECIAL CARE FACTORS FREQUENCY  PT (By licensed PT), OT (By licensed OT)     PT Frequency: 5 OT Frequency: 5            Contractures Contractures Info: Not present    Additional Factors Info  Code Status, Allergies, Psychotropic Code Status Info: Full code Allergies Info: Morphine and Related, Prednisone, Namzaric (Memantine Hcl-donepezil Hcl) Psychotropic Info: Xanax         Current Medications (07/16/2016):  This is the current hospital active medication list Current Facility-Administered Medications  Medication Dose Route Frequency Provider Last Rate Last Dose  . acetaminophen (TYLENOL) tablet 1,000 mg  1,000 mg Oral Q6H Vickki HearingStanley E Harrison, MD   1,000 mg at 07/16/16 [phone]  . ALPRAZolam Prudy Feeler(XANAX) tablet 0.25 mg  0.25 mg Oral TID PRN Meredeth IdeGagan S Lama, MD   0.25 mg at 07/15/16 2037  . aspirin EC tablet 325 mg  325 mg Oral Q breakfast Vickki HearingStanley E Harrison, MD   325 mg at 07/16/16 0957  . carbidopa-levodopa (SINEMET IR) 25-100 MG per tablet immediate release 1 tablet  1 tablet Oral BID Meredeth IdeGagan S Lama, MD   1 tablet at 07/16/16 0957  . docusate sodium (COLACE) capsule 100 mg  100 mg Oral BID Vickki HearingStanley E Harrison, MD   100 mg at 07/15/16 2037  . HYDROcodone-acetaminophen (NORCO/VICODIN) 5-325 MG per tablet 1 tablet  1 tablet Oral Q4H PRN Vickki HearingStanley E Harrison, MD      . HYDROmorphone (DILAUDID) injection 0.1  mg  0.1 mg Intravenous Q2H PRN Vickki Hearing, MD      . magnesium citrate solution 1 Bottle  1 Bottle Oral Once PRN Vickki Hearing, MD      . memantine [facility]) tablet 5 mg  5 mg Oral BID Meredeth Ide, MD   5 mg at 07/16/16 0957  . menthol-cetylpyridinium (CEPACOL) lozenge 3 mg  1 lozenge Oral PRN Vickki Hearing, MD       Or  . phenol (CHLORASEPTIC) mouth spray 1 spray  1 spray Mouth/Throat PRN Vickki Hearing, MD      . methocarbamol (ROBAXIN) tablet 500 mg  500 mg Oral Q6H PRN Vickki Hearing, MD   500  mg at 07/16/16 0957   Or  . methocarbamol (ROBAXIN) 500 mg in dextrose 5 % 50 mL IVPB  500 mg Intravenous Q6H PRN Vickki Hearing, MD      . metoCLOPramide (REGLAN) tablet 5-10 mg  5-10 mg Oral Q8H PRN Vickki Hearing, MD       Or  . metoCLOPramide (REGLAN) injection 5-10 mg  5-10 mg Intravenous Q8H PRN Vickki Hearing, MD      . mirtazapine (REMERON) tablet 30 mg  30 mg Oral QHS Meredeth Ide, MD   30 mg at 07/15/16 2038  . morphine 2 MG/ML injection 0.5 mg  0.5 mg Intravenous Q2H PRN Vickki Hearing, MD      . ondansetron [facility]) injection 4 mg  4 mg Intravenous Q6H PRN Erick Blinks, MD   4 mg at 07/15/16 1610  . ondansetron (ZOFRAN) tablet 4 mg  4 mg Oral Q6H PRN Vickki Hearing, MD       Or  . ondansetron [facility]) injection 4 mg  4 mg Intravenous Q6H PRN Vickki Hearing, MD      . polyethylene glycol (MIRALAX / GLYCOLAX) packet 17 g  17 g Oral Daily PRN Vickki Hearing, MD      . sorbitol 70 % solution 30 mL  30 mL Oral Daily PRN Vickki Hearing, MD         Discharge Medications: Please see discharge summary for a list of discharge medications.  Relevant Imaging Results:  Relevant Lab Results:   Additional Information SSN: [id number]  Karn [place], [place] [phone]

## 2016-07-16 NOTE — Progress Notes (Signed)
Patient ID: [name] Oliver, female   DOB: 05/11/1933, 83 y.o.   MRN: [id number] POD 1 LEFT BIPOLAR DIRECT LATERAL APPROACH  BP 128/60 (BP Location: Right Arm)   Pulse 80   Temp 98.5 F (36.9 C) (Oral)   Resp 20   Ht 5\' 3"  (1.6 m)   Wt 128 lb 14.4 oz (58.5 kg)   SpO2 98%   BMI 22.83 kg/m   SLEEPING  SCANT DRAINAGE OVER THE DRESSING   CBC Latest Ref Rng & Units 07/16/2016 07/14/2016 09/24/2013  WBC 4.0 - 10.5 K/uL 9.3 9.7 9.2  Hemoglobin 12.0 - 15.0 g/dL 10.7(L) 12.3 11.2(L)  Hematocrit 36.0 - 46.0 % 31.9(L) 36.3 33.2(L)  Platelets 150 - 400 K/uL 240 281 246   BMP Latest Ref Rng & Units 07/16/2016 07/14/2016 09/21/2013  Glucose 65 - 99 mg/dL 147(W103(H) 295(A118(H) 98  BUN 6 - 20 mg/dL 14 21(H21(H) 14  Creatinine 0.44 - 1.00 mg/dL 0.860.69 5.780.85 4.690.76  Sodium 135 - 145 mmol/L 137 140 138  Potassium 3.5 - 5.1 mmol/L 3.9 3.9 4.0  Chloride 101 - 111 mmol/L 105 106 100  CO2 22 - 32 mmol/L 26 26 27   Calcium 8.9 - 10.3 mg/dL 6.2(X8.8(L) 9.3 9.2   S/P LEFT BIPOLAR FOR FRACTURED HIP  STABLE  KEEP OPIOIDS TO A MINIMUM  ATTEMPT PT TODAY

## 2016-07-16 NOTE — Clinical Social Work Placement (Signed)
   CLINICAL SOCIAL WORK PLACEMENT  NOTE  Date:  07/16/2016  Patient Details  Name: [name] Oliver MRN: [id number] Date of Birth: 10/21/1932  Clinical Social Work is seeking post-discharge placement for this patient at the Skilled  Nursing Facility level of care (*CSW will initial, date and re-position this form in  chart as items are completed):  Yes   Patient/family provided with [place] Clinical Social Work Department's list of facilities offering this level of care within the geographic area requested by the patient (or if unable, by the patient's family).  Yes   Patient/family informed of their freedom to choose among providers that offer the needed level of care, that participate in Medicare, Medicaid or managed care program needed by the patient, have an available bed and are willing to accept the patient.  Yes   Patient/family informed of [place]'s ownership interest in [facility] and [facility], as well as of the fact that they are under no obligation to receive care at these facilities.  PASRR submitted to EDS on 07/16/16     PASRR number received on 07/16/16     Existing PASRR number confirmed on       FL2 transmitted to all facilities in geographic area requested by pt/family on 07/16/16     FL2 transmitted to all facilities within larger geographic area on       Patient informed that his/her managed care company has contracts with or will negotiate with certain facilities, including the following:        Yes   Patient/family informed of bed offers received.  [facility]     Physician recommends and patient chooses bed at      Patient to be [facility] on  .  Patient to be transferred to facility by       Patient family notified on   of transfer.  Name of family member notified:        PHYSICIAN       Additional Comment:    _______________________________________________ Karn CassisStultz, Calyx Hawker  Shanaberger, LCSW 07/16/2016, 1:18 PM [phone]

## 2016-07-16 NOTE — Addendum Note (Signed)
Addendum  created 07/16/16 0758 by Despina Hiddenobert J Airen Stiehl, CRNA   Sign clinical note

## 2016-07-16 NOTE — Clinical Social Work Note (Signed)
Clinical Social Work Assessment  Patient Details  Name: [name] Oliver MRN: [id number] Date of Birth: 03/10/1933  Date of referral:  07/16/16               Reason for consult:  Discharge Planning                Permission sought to share information with:  Family Supports Permission granted to share information::  Yes, Verbal Permission Granted  Name::        Agency::     Relationship::  son, daughter  Contact Information:     Housing/Transportation Living arrangements for the past 2 months:  Single Family Home Source of Information:  Patient, Adult Children, Spouse Patient Interpreter Needed:  None Criminal Activity/Legal Involvement Pertinent to Current Situation/Hospitalization:  No - Comment as needed Significant Relationships:  Adult Children, Spouse Lives with:  Spouse Do you feel safe going back to the place where you live?  Yes Need for family participation in patient care:  Yes (Comment)  Care giving concerns:  Pt will require SNF after hip fracture.    Social Worker assessment / plan:  CSW met with pt, pt's husband, and son at bedside. Pt admitted due to hip fracture and is post op day 1. She lives with her husband and is independent with ADLs and still drives some. She has been to SNF in the past after a hip fracture and then went to ALF for awhile after rehab. She has been home for about 2 years. CSW discussed placement process, including Medicare coverage/criteria. They would like pt to stay in [place] if possible.   Employment status:  Retired Forensic scientist:  Medicare PT Recommendations:  [place] / Referral to community resources:  [place]  Patient/Family's Response to care:  Pt and family request placement in [place].   Patient/Family's Understanding of and Emotional Response to Diagnosis, Current Treatment, and Prognosis:  Pt and family are aware of admission diagnosis and treatment plan. Pt has been to SNF in  the past after hip fracture.   Emotional Assessment Appearance:  Appears stated age Attitude/Demeanor/Rapport:  Other (Cooperative) Affect (typically observed):  Accepting Orientation:  Oriented to Self, Oriented to Place, Oriented to  Time, Oriented to Situation Alcohol / Substance use:  Not Applicable Psych involvement (Current and /or in the community):  No (Comment)  Discharge Needs  Concerns to be addressed:  Discharge Planning Concerns Readmission within the last 30 days:  No Current discharge risk:  Physical Impairment Barriers to Discharge:  Continued Medical Work up   Salome Arnt, [place] 07/16/2016, 10:02 AM [phone]

## 2016-07-16 NOTE — Anesthesia Postprocedure Evaluation (Signed)
Anesthesia Post Note  Patient: [name] Oliver  Procedure(s) Performed: Procedure(s) (LRB): BIPOLAR REPLACEMENT LEFT HIP (Left)  Patient location during evaluation: Nursing Unit Anesthesia Type: Spinal Level of consciousness: awake and patient cooperative Pain management: pain level controlled Vital Signs Assessment: post-procedure vital signs reviewed and stable Respiratory status: spontaneous breathing, nonlabored ventilation and respiratory function stable Cardiovascular status: blood pressure returned to baseline Postop Assessment: no signs of nausea or vomiting Anesthetic complications: no     Last Vitals:  Vitals:   07/15/16 2136 07/16/16 0500  BP: 127/64 128/60  Pulse: 88 80  Resp: 20 20  Temp: 36.8 C 36.9 C    Last Pain:  Vitals:   07/16/16 0500  TempSrc: Oral  PainSc:                  Marrell Dicaprio J

## 2016-07-16 NOTE — Clinical Social Work Placement (Signed)
   CLINICAL SOCIAL WORK PLACEMENT  NOTE  Date:  07/16/2016  Patient Details  Name: [name] Oliver MRN: [id number] Date of Birth: 03/04/1933  Clinical Social Work is seeking post-discharge placement for this patient at the Skilled  Nursing Facility level of care (*CSW will initial, date and re-position this form in  chart as items are completed):  Yes   Patient/family provided with [place] Clinical Social Work Department's list of facilities offering this level of care within the geographic area requested by the patient (or if unable, by the patient's family).  Yes   Patient/family informed of their freedom to choose among providers that offer the needed level of care, that participate in Medicare, Medicaid or managed care program needed by the patient, have an available bed and are willing to accept the patient.  Yes   Patient/family informed of [place]'s ownership interest in [facility] and [facility], as well as of the fact that they are under no obligation to receive care at these facilities.  PASRR submitted to EDS on 07/16/16     PASRR number received on 07/16/16     Existing PASRR number confirmed on       FL2 transmitted to all facilities in geographic area requested by pt/family on 07/16/16     FL2 transmitted to all facilities within larger geographic area on       Patient informed that his/her managed care company has contracts with or will negotiate with certain facilities, including the following:            Patient/family informed of bed offers received.  Patient chooses bed at       Physician recommends and patient chooses bed at      Patient to be transferred to   on  .  Patient to be transferred to facility by       Patient family notified on   of transfer.  Name of family member notified:        PHYSICIAN       Additional Comment:    _______________________________________________ Karn CassisStultz, Rasha Ibe Shanaberger, LCSW 07/16/2016, 10:00  AM [phone]

## 2016-07-17 LAB — URINALYSIS, ROUTINE W REFLEX MICROSCOPIC
Bilirubin Urine: NEGATIVE
GLUCOSE, UA: NEGATIVE mg/dL
Hgb urine dipstick: NEGATIVE
KETONES UR: NEGATIVE mg/dL
NITRITE: NEGATIVE
PROTEIN: NEGATIVE mg/dL
Specific Gravity, Urine: 1.02 (ref 1.005–1.030)
pH: 6 (ref 5.0–8.0)

## 2016-07-17 LAB — CBC
HCT: 31.4 % — ABNORMAL LOW (ref 36.0–46.0)
Hemoglobin: 10.5 g/dL — ABNORMAL LOW (ref 12.0–15.0)
MCH: 32.9 pg (ref 26.0–34.0)
MCHC: 33.4 g/dL (ref 30.0–36.0)
MCV: 98.4 fL (ref 78.0–100.0)
PLATELETS: 246 10*3/uL (ref 150–400)
RBC: 3.19 MIL/uL — ABNORMAL LOW (ref 3.87–5.11)
RDW: 12.7 % (ref 11.5–15.5)
WBC: 11.2 10*3/uL — AB (ref 4.0–10.5)

## 2016-07-17 LAB — URINALYSIS, MICROSCOPIC (REFLEX)
Bacteria, UA: NONE SEEN
RBC / HPF: NONE SEEN RBC/hpf (ref 0–5)

## 2016-07-17 LAB — BASIC METABOLIC PANEL
ANION GAP: 6 (ref 5–15)
BUN: 14 mg/dL (ref 6–20)
CALCIUM: 8.8 mg/dL — AB (ref 8.9–10.3)
CO2: 30 mmol/L (ref 22–32)
Chloride: 101 mmol/L (ref 101–111)
Creatinine, Ser: 0.81 mg/dL (ref 0.44–1.00)
Glucose, Bld: 119 mg/dL — ABNORMAL HIGH (ref 65–99)
Potassium: 3.9 mmol/L (ref 3.5–5.1)
SODIUM: 137 mmol/L (ref 135–145)

## 2016-07-17 MED ORDER — SODIUM CHLORIDE 0.9 % IV SOLN
INTRAVENOUS | Status: DC
  Administered 2016-07-17: 11:00:00 via INTRAVENOUS

## 2016-07-17 MED ORDER — ACETAMINOPHEN 500 MG PO TABS
1000.0000 mg | ORAL_TABLET | Freq: Four times a day (QID) | ORAL | Status: DC
  Administered 2016-07-17: 1000 mg via ORAL
  Filled 2016-07-17: qty 2

## 2016-07-17 MED ORDER — ACETAMINOPHEN 500 MG PO TABS
500.0000 mg | ORAL_TABLET | Freq: Four times a day (QID) | ORAL | Status: DC
  Administered 2016-07-17 – 2016-07-18 (×3): 500 mg via ORAL
  Filled 2016-07-17 (×4): qty 1

## 2016-07-17 NOTE — Progress Notes (Signed)
Patient ID: [name] Oliver, female   DOB: 02/27/1933, 83 y.o.   MRN: [id number]   Postoperative day #2 status post bipolar replacement of the left hip for left femoral neck fracture  BP 110/68 (BP Location: Right Arm)   Pulse (!) 101   Temp 98.4 F (36.9 C) (Oral)   Resp 20   Ht 5\' 3"  (1.6 m)   Wt 128 lb 14.4 oz (58.5 kg)   SpO2 98%   BMI 22.83 kg/m   CBC Latest Ref Rng & Units 07/17/2016 07/16/2016 07/14/2016  WBC 4.0 - 10.5 K/uL 11.2(H) 9.3 9.7  Hemoglobin 12.0 - 15.0 g/dL 10.5(L) 10.7(L) 12.3  Hematocrit 36.0 - 46.0 % 31.4(L) 31.9(L) 36.3  Platelets 150 - 400 K/uL 246 240 281    BMP Latest Ref Rng & Units 07/17/2016 07/16/2016 07/14/2016  Glucose 65 - 99 mg/dL 409(W119(H) 119(J103(H) 478(G118(H)  BUN 6 - 20 mg/dL 14 14 95(A21(H)  Creatinine 0.44 - 1.00 mg/dL 2.130.81 0.860.69 5.780.85  Sodium 135 - 145 mmol/L 137 137 140  Potassium 3.5 - 5.1 mmol/L 3.9 3.9 3.9  Chloride 101 - 111 mmol/L 101 105 106  CO2 22 - 32 mmol/L 30 26 26   Calcium 8.9 - 10.3 mg/dL 4.6(N8.8(L) 6.2(X8.8(L) 9.3   DISCHARGE INSTRUCTIONS FOR ORTHOPAEDICS  WBAT STAPLES OUT POD 14 F/U DR Reeves Musick IN 2  WEEKS  DVT PROPHYLAXIS X 28 DAYS WITH ECOTRIN  DIRECT LATERAL HIP APPROACH PRECAUTIONS PILLOW BETWEEN LEGS FOR 6 WEEKS

## 2016-07-17 NOTE — Progress Notes (Signed)
PROGRESS NOTE    [name] Oliver  UJW:[id number] DOB: 04/05/1933 DOA: 07/14/2016 PCP: Dwana MelenaZack Hall, MD    Brief Narrative:  83 year old female with history of Parkinson's disease and poor balance, presented after a mechanical fall. Have a Left Hip Fracture. Seen by Orthopedics and Underwent Operative Management on 2/8. Plan is for skilled nursing facility placement upon discharge.  Assessment & Plan:   Active Problems:   Fall   Parkinson's disease dementia (HCC)   Femoral neck fracture (HCC)   Hip fracture (HCC)   Closed fracture of neck of left femur (HCC)   Anemia   1. Left hip fracture. Patient was seen by orthopedics and underwent operative management on 2/8. Will continue with physical therapy. Continue pain management  2. Parkinson's disease. Continue on Sinemet.  3. Anxiety. Continue Xanax when necessary.  4. Anemia. Likely related to blood loss. Continue to monitor  5. Dementia related to Parkinson's. Family has noted some confusion overnight. Possibly sundowning. Check urinalysis to rule out infection. Will limit narcotics.   DVT prophylaxis: ASA and SCDs Code Status: full Family Communication: discussed with son and daughter at the bedside Disposition Plan: discharge to SNF when improved   Consultants:   orthopedics  Procedures:  BIPOLAR REPLACEMENT LEFT HIP (Left)   Antimicrobials:      Subjective: Family has noted some confusion overnight. Has been complaining of back pain.  Objective: Vitals:   07/16/16 0500 07/16/16 1507 07/16/16 2220 07/17/16 1429  BP: 128/60 (!) 106/51 110/68 (!) 136/53  Pulse: 80 (!) 106 (!) 101 93  Resp: 20 16 20 16   Temp: 98.5 F (36.9 C) 98.2 F (36.8 C) 98.4 F (36.9 C) 97.6 F (36.4 C)  TempSrc: Oral Oral Oral Oral  SpO2: 98% 98% 98% 98%  Weight:      Height:        Intake/Output Summary (Last 24 hours) at 07/17/16 1609 Last data filed at 07/17/16 1300  Gross per 24 hour  Intake              600 ml  Output               200 ml  Net              400 ml   Filed Weights   07/15/16 0925  Weight: 58.5 kg (128 lb 14.4 oz)    Examination:  General exam: Appears calm and comfortable  Respiratory system: Clear to auscultation. Respiratory effort normal. Cardiovascular system: S1 & S2 heard, RRR. No JVD, murmurs, rubs, gallops or clicks. No pedal edema. Gastrointestinal system: Abdomen is nondistended, soft and nontender. No organomegaly or masses felt. Normal bowel sounds heard. Central nervous system: Alert and oriented. No focal neurological deficits. Extremities: Symmetric 5 x 5 power. Skin: No rashes, lesions or ulcers Psychiatry: Judgement and insight appear normal. Mood & affect appropriate.     Data Reviewed: I have personally reviewed following labs and imaging studies  CBC:  Recent Labs Lab 07/14/16 2210 07/16/16 0433 07/17/16 0604  WBC 9.7 9.3 11.2*  NEUTROABS 7.1  --   --   HGB 12.3 10.7* 10.5*  HCT 36.3 31.9* 31.4*  MCV 97.8 98.8 98.4  PLT 281 240 246   Basic Metabolic Panel:  Recent Labs Lab 07/14/16 2210 07/16/16 0433 07/17/16 0604  NA 140 137 137  K 3.9 3.9 3.9  CL 106 105 101  CO2 26 26 30   GLUCOSE 118* 103* 119*  BUN 21* 14 14  CREATININE 0.85 0.69  0.81  CALCIUM 9.3 8.8* 8.8*   GFR: Estimated Creatinine Clearance: 45.1 mL/min (by C-G formula based on SCr of 0.81 mg/dL). Liver Function Tests: No results for input(s): AST, ALT, ALKPHOS, BILITOT, PROT, ALBUMIN in the last 168 hours. No results for input(s): LIPASE, AMYLASE in the last 168 hours. No results for input(s): AMMONIA in the last 168 hours. Coagulation Profile: No results for input(s): INR, PROTIME in the last 168 hours. Cardiac Enzymes: No results for input(s): CKTOTAL, CKMB, CKMBINDEX, TROPONINI in the last 168 hours. BNP (last 3 results) No results for input(s): PROBNP in the last 8760 hours. HbA1C: No results for input(s): HGBA1C in the last 72 hours. CBG: No results for input(s): GLUCAP in  the last 168 hours. Lipid Profile: No results for input(s): CHOL, HDL, LDLCALC, TRIG, CHOLHDL, LDLDIRECT in the last 72 hours. Thyroid Function Tests: No results for input(s): TSH, T4TOTAL, FREET4, T3FREE, THYROIDAB in the last 72 hours. Anemia Panel: No results for input(s): VITAMINB12, FOLATE, FERRITIN, TIBC, IRON, RETICCTPCT in the last 72 hours. Sepsis Labs: No results for input(s): PROCALCITON, LATICACIDVEN in the last 168 hours.  Recent Results (from the past 240 hour(s))  MRSA PCR Screening     Status: None   Collection Time: 07/15/16 10:47 AM  Result Value Ref Range Status   MRSA by PCR NEGATIVE NEGATIVE Final    Comment:        The GeneXpert MRSA Assay (FDA approved for NASAL specimens only), is one component of a comprehensive MRSA colonization surveillance program. It is not intended to diagnose MRSA infection nor to guide or monitor treatment for MRSA infections.          Radiology Studies: Dg Pelvis Portable  Result Date: 07/15/2016 CLINICAL DATA:  Status post LEFT hip replacement. LEFT femoral neck fracture. EXAM: PORTABLE PELVIS 1-2 VIEWS COMPARISON:  07/14/2016. FINDINGS: Patient is status post open reduction internal fixation of a LEFT femoral neck fracture with a bipolar prosthesis. Satisfactory position and alignment. Remote RIGHT THA. IMPRESSION: No adverse features. Electronically Signed   By: Elsie Stain M.D.   On: 07/15/2016 16:34        Scheduled Meds: . acetaminophen  1,000 mg Oral Q6H  . acetaminophen  500 mg Oral Q6H  . aspirin EC  325 mg Oral Q breakfast  . carbidopa-levodopa  1 tablet Oral BID  . docusate sodium  100 mg Oral BID  . memantine  5 mg Oral BID  . mirtazapine  30 mg Oral QHS   Continuous Infusions: . sodium chloride       LOS: 2 days    Time spent:    MEMON,JEHANZEB, MD [facility] Pager [phone]  If 7PM-7AM, please contact night-coverage [email or web address] Password [email or web address] 07/17/2016, 4:09 PM

## 2016-07-17 NOTE — Progress Notes (Signed)
Physical Therapy Treatment Patient Details Name: [name] Oliver MRN: [id number] DOB: 1932-08-29 Today's Date: 07/17/2016    History of Present Illness 83 y.o. female,With history of Parkinson disease, hip fracture came to hospital after patient fell at home and complained of left hip pain. Patient said that she was about to take her daily bath and fell in the bathroom after she lost her balance.  Pt is now s/p L bipolar hip replacement - lateral approach.     PT Comments    Pt received in bed, family present, but stepped out for tx.  Pt continues to be very slow to respond with commands, poor initiation of movement, as well as very flat affect.  She continues to require Max A for supine<>sit.  STEDY eventually used for sit<>stand and transfer bed<>chair.  Worked on anterior weight shift with standing and hip extension once she is in standing to improve her ability to transfer.  Continue to recommend SNF at this time due to continued need for Max A and STEDY for all functional mobility tasks.     Follow Up Recommendations  SNF     Equipment Recommendations  None recommended by PT    Recommendations for Other Services       Precautions / Restrictions Precautions Precautions: Fall Restrictions Weight Bearing Restrictions: Yes LLE Weight Bearing: Weight bearing as tolerated    Mobility  Bed Mobility Overal bed mobility: Needs Assistance Bed Mobility: Supine to Sit     Supine to sit: Max assist;HOB elevated        Transfers Overall transfer level: Needs assistance Equipment used: Rolling walker (2 wheeled) Transfers: Sit to/from Stand Sit to Stand: Max assist         General transfer comment: Attempt x 2 with RW, but pt is not able to get hips shifted forward and continues to demonstrate poor weight bearing through UE's.  STEDY used to transfer pt bed<>chair.    Ambulation/Gait                 Stairs            Wheelchair Mobility    Modified Rankin  (Stroke Patients Only)       Balance Overall balance assessment: History of Falls;Needs assistance Sitting-balance support: Bilateral upper extremity supported;Feet supported   Sitting balance - Comments: Improved sitting balance today - able to get weight shifted forward with target and cues for her to hit the target with her forehead.    Standing balance support: Bilateral upper extremity supported Standing balance-Leahy Scale: Zero                      Cognition Arousal/Alertness: Awake/alert Behavior During Therapy: Flat affect (PMH of parkinsons and dementia.  Slow to respond)                        Exercises Total Joint Exercises Heel Slides: 10 reps;AAROM;Supine Heel Slides Limitations: Poor initiation.  Improved for the last 5 reps.   Long Arc Quad: Strengthening;Both;5 reps;Seated Other Exercises Other Exercises: hip extension while standing in STEDY x 3 reps.  Instructed pt to pull buttocks up off the seat pads.   Other Exercises: Seated modified abdominal crunch with UE's on teh arm rest of the chair and flexing trunk forward and back x 8 reps. Constant vc's. and visual cues.     General Comments        Pertinent Vitals/Pain Pain Assessment: Faces Faces Pain  Scale: Hurts even more Pain Location: L hip Pain Descriptors / Indicators: Grimacing;Guarding Pain Intervention(s): Limited activity within patient's tolerance;Monitored during session;Repositioned    Home Living                      Prior Function            PT Goals (current goals can now be found in the care plan section) Acute Rehab PT Goals Patient Stated Goal: To get strong enough to get back home. PT Goal Formulation: With patient Time For Goal Achievement: 07/23/16 Potential to Achieve Goals: Fair Progress towards PT goals: Progressing toward goals    Frequency    7X/week      PT Plan Current plan remains appropriate    Co-evaluation              End of Session Equipment Utilized During Treatment: Gait belt;Other (comment) (STEDY) Activity Tolerance: Patient limited by fatigue Patient left: in chair;with call bell/phone within reach;with family/visitor present     Time: 1020-1050 PT Time Calculation (min) (ACUTE ONLY): 30 min  Charges:  $Therapeutic Exercise: 8-22 mins $Therapeutic Activity: 8-22 mins                    G Codes:      Beth Bettyanne Dittman, PT, DPT X: [phone]

## 2016-07-17 NOTE — Progress Notes (Signed)
Attempted to call Beverely PaceBryant the MSW again.  Call went to voice mail.  Previous message sent.  Patient was updated about the POC.

## 2016-07-18 ENCOUNTER — Inpatient Hospital Stay
Admission: RE | Admit: 2016-07-18 | Discharge: 2016-07-23 | Disposition: A | Discharge status: Nursing Home (Includes ICF) | Payer: Medicare Other | Source: Ambulatory Visit | Attending: Internal Medicine | Admitting: Internal Medicine

## 2016-07-18 DIAGNOSIS — F411 Generalized anxiety disorder: Secondary | ICD-10-CM | POA: Diagnosis not present

## 2016-07-18 DIAGNOSIS — M6281 Muscle weakness (generalized): Secondary | ICD-10-CM | POA: Diagnosis not present

## 2016-07-18 DIAGNOSIS — Z8781 Personal history of (healed) traumatic fracture: Secondary | ICD-10-CM | POA: Diagnosis not present

## 2016-07-18 DIAGNOSIS — R278 Other lack of coordination: Secondary | ICD-10-CM | POA: Diagnosis not present

## 2016-07-18 DIAGNOSIS — F0280 Dementia in other diseases classified elsewhere without behavioral disturbance: Secondary | ICD-10-CM | POA: Diagnosis not present

## 2016-07-18 DIAGNOSIS — N390 Urinary tract infection, site not specified: Secondary | ICD-10-CM | POA: Diagnosis not present

## 2016-07-18 DIAGNOSIS — M25552 Pain in left hip: Secondary | ICD-10-CM | POA: Diagnosis not present

## 2016-07-18 DIAGNOSIS — D62 Acute posthemorrhagic anemia: Secondary | ICD-10-CM | POA: Diagnosis not present

## 2016-07-18 DIAGNOSIS — Z87440 Personal history of urinary (tract) infections: Secondary | ICD-10-CM | POA: Diagnosis not present

## 2016-07-18 DIAGNOSIS — S72122A Displaced fracture of lesser trochanter of left femur, initial encounter for closed fracture: Secondary | ICD-10-CM | POA: Diagnosis not present

## 2016-07-18 DIAGNOSIS — G3183 Dementia with Lewy bodies: Secondary | ICD-10-CM | POA: Diagnosis not present

## 2016-07-18 DIAGNOSIS — T84011A Broken internal left hip prosthesis, initial encounter: Secondary | ICD-10-CM | POA: Diagnosis not present

## 2016-07-18 DIAGNOSIS — S72002D Fracture of unspecified part of neck of left femur, subsequent encounter for closed fracture with routine healing: Secondary | ICD-10-CM | POA: Diagnosis not present

## 2016-07-18 DIAGNOSIS — Z96642 Presence of left artificial hip joint: Secondary | ICD-10-CM | POA: Diagnosis not present

## 2016-07-18 DIAGNOSIS — S299XXA Unspecified injury of thorax, initial encounter: Secondary | ICD-10-CM | POA: Diagnosis not present

## 2016-07-18 DIAGNOSIS — Z820 Family history of epilepsy and other diseases of the nervous system: Secondary | ICD-10-CM | POA: Diagnosis not present

## 2016-07-18 DIAGNOSIS — S72113A Displaced fracture of greater trochanter of unspecified femur, initial encounter for closed fracture: Secondary | ICD-10-CM | POA: Diagnosis not present

## 2016-07-18 DIAGNOSIS — Y92129 Unspecified place in nursing home as the place of occurrence of the external cause: Secondary | ICD-10-CM | POA: Diagnosis not present

## 2016-07-18 DIAGNOSIS — Z9181 History of falling: Secondary | ICD-10-CM | POA: Diagnosis not present

## 2016-07-18 DIAGNOSIS — I10 Essential (primary) hypertension: Secondary | ICD-10-CM | POA: Diagnosis present

## 2016-07-18 DIAGNOSIS — G20 Parkinson's disease: Secondary | ICD-10-CM | POA: Diagnosis not present

## 2016-07-18 DIAGNOSIS — D649 Anemia, unspecified: Secondary | ICD-10-CM | POA: Diagnosis not present

## 2016-07-18 DIAGNOSIS — Z9842 Cataract extraction status, left eye: Secondary | ICD-10-CM | POA: Diagnosis not present

## 2016-07-18 DIAGNOSIS — M25572 Pain in left ankle and joints of left foot: Secondary | ICD-10-CM | POA: Diagnosis not present

## 2016-07-18 DIAGNOSIS — M9702XA Periprosthetic fracture around internal prosthetic left hip joint, initial encounter: Secondary | ICD-10-CM | POA: Diagnosis not present

## 2016-07-18 DIAGNOSIS — D72829 Elevated white blood cell count, unspecified: Secondary | ICD-10-CM | POA: Diagnosis not present

## 2016-07-18 DIAGNOSIS — W19XXXA Unspecified fall, initial encounter: Secondary | ICD-10-CM | POA: Diagnosis not present

## 2016-07-18 DIAGNOSIS — N3000 Acute cystitis without hematuria: Secondary | ICD-10-CM | POA: Diagnosis not present

## 2016-07-18 DIAGNOSIS — R05 Cough: Secondary | ICD-10-CM | POA: Diagnosis present

## 2016-07-18 DIAGNOSIS — R112 Nausea with vomiting, unspecified: Secondary | ICD-10-CM | POA: Diagnosis not present

## 2016-07-18 DIAGNOSIS — Z7982 Long term (current) use of aspirin: Secondary | ICD-10-CM | POA: Diagnosis not present

## 2016-07-18 DIAGNOSIS — Z79899 Other long term (current) drug therapy: Secondary | ICD-10-CM | POA: Diagnosis not present

## 2016-07-18 DIAGNOSIS — R262 Difficulty in walking, not elsewhere classified: Secondary | ICD-10-CM | POA: Diagnosis not present

## 2016-07-18 DIAGNOSIS — Z888 Allergy status to other drugs, medicaments and biological substances status: Secondary | ICD-10-CM | POA: Diagnosis not present

## 2016-07-18 DIAGNOSIS — S72002S Fracture of unspecified part of neck of left femur, sequela: Secondary | ICD-10-CM | POA: Diagnosis not present

## 2016-07-18 DIAGNOSIS — D519 Vitamin B12 deficiency anemia, unspecified: Secondary | ICD-10-CM | POA: Diagnosis not present

## 2016-07-18 DIAGNOSIS — R410 Disorientation, unspecified: Secondary | ICD-10-CM | POA: Diagnosis not present

## 2016-07-18 DIAGNOSIS — Z885 Allergy status to narcotic agent status: Secondary | ICD-10-CM | POA: Diagnosis not present

## 2016-07-18 DIAGNOSIS — R600 Localized edema: Secondary | ICD-10-CM | POA: Diagnosis not present

## 2016-07-18 DIAGNOSIS — Z90710 Acquired absence of both cervix and uterus: Secondary | ICD-10-CM | POA: Diagnosis not present

## 2016-07-18 DIAGNOSIS — Z471 Aftercare following joint replacement surgery: Secondary | ICD-10-CM | POA: Diagnosis not present

## 2016-07-18 DIAGNOSIS — Z9841 Cataract extraction status, right eye: Secondary | ICD-10-CM | POA: Diagnosis not present

## 2016-07-18 DIAGNOSIS — G219 Secondary parkinsonism, unspecified: Secondary | ICD-10-CM | POA: Diagnosis not present

## 2016-07-18 DIAGNOSIS — J101 Influenza due to other identified influenza virus with other respiratory manifestations: Secondary | ICD-10-CM | POA: Diagnosis not present

## 2016-07-18 DIAGNOSIS — F0390 Unspecified dementia without behavioral disturbance: Secondary | ICD-10-CM | POA: Diagnosis not present

## 2016-07-18 DIAGNOSIS — S72002G Fracture of unspecified part of neck of left femur, subsequent encounter for closed fracture with delayed healing: Secondary | ICD-10-CM | POA: Diagnosis not present

## 2016-07-18 LAB — CBC
HCT: 30.3 % — ABNORMAL LOW (ref 36.0–46.0)
Hemoglobin: 10.1 g/dL — ABNORMAL LOW (ref 12.0–15.0)
MCH: 32.8 pg (ref 26.0–34.0)
MCHC: 33.3 g/dL (ref 30.0–36.0)
MCV: 98.4 fL (ref 78.0–100.0)
PLATELETS: 243 10*3/uL (ref 150–400)
RBC: 3.08 MIL/uL — AB (ref 3.87–5.11)
RDW: 13 % (ref 11.5–15.5)
WBC: 9.2 10*3/uL (ref 4.0–10.5)

## 2016-07-18 LAB — BASIC METABOLIC PANEL
Anion gap: 4 — ABNORMAL LOW (ref 5–15)
BUN: 15 mg/dL (ref 6–20)
CO2: 26 mmol/L (ref 22–32)
Calcium: 8.3 mg/dL — ABNORMAL LOW (ref 8.9–10.3)
Chloride: 108 mmol/L (ref 101–111)
Creatinine, Ser: 0.7 mg/dL (ref 0.44–1.00)
Glucose, Bld: 101 mg/dL — ABNORMAL HIGH (ref 65–99)
POTASSIUM: 3.7 mmol/L (ref 3.5–5.1)
SODIUM: 138 mmol/L (ref 135–145)

## 2016-07-18 MED ORDER — ALPRAZOLAM 0.25 MG PO TABS: 0.2500 mg | ORAL_TABLET | Freq: Three times a day (TID) | ORAL | 0 refills | Status: DC | PRN

## 2016-07-18 MED ORDER — DOCUSATE SODIUM 100 MG PO CAPS: 100.0000 mg | ORAL_CAPSULE | Freq: Two times a day (BID) | ORAL | 0 refills | Status: DC

## 2016-07-18 MED ORDER — ACETAMINOPHEN 500 MG PO TABS: 500.0000 mg | ORAL_TABLET | Freq: Four times a day (QID) | ORAL | 0 refills | Status: DC

## 2016-07-18 MED ORDER — ASPIRIN 325 MG PO TBEC: 325.0000 mg | DELAYED_RELEASE_TABLET | Freq: Every day | ORAL | 0 refills | Status: DC

## 2016-07-18 MED ORDER — METHOCARBAMOL 500 MG PO TABS: 500.0000 mg | ORAL_TABLET | Freq: Four times a day (QID) | ORAL | 0 refills | Status: DC | PRN

## 2016-07-18 MED ORDER — HYDROCODONE-ACETAMINOPHEN 5-325 MG PO TABS: 1.0000 | ORAL_TABLET | ORAL | 0 refills | Status: DC | PRN

## 2016-07-18 NOTE — Progress Notes (Signed)
Physical Therapy Treatment Patient Details Name: [name] Oliver MRN: [id number] DOB: [birth year] Today's Date: 07/18/2016    History of Present Illness 83 y.o. female,With history of Parkinson disease, hip fracture came to hospital after patient fell at home and complained of left hip pain. Patient said that she was about to take her daily bath and fell in the bathroom after she lost her balance.  Pt is now s/p L bipolar hip replacement - lateral approach.     PT Comments    Pt received in bed, dtr present, but stepped out for mobility portion of tx.  Pt demonstrates improved verbal interaction today, as well as improved initiation of movement and ability to follow commands.  Pt continues to require Max A for all functional mobility tasks, however she was actually able to come into full upright position today while using the STEDY.  She will be ready to attempt weight shifting at next visit to progress transfers using a RW.  Continue to recommend SNF.   Follow Up Recommendations  SNF     Equipment Recommendations  None recommended by PT    Recommendations for Other Services       Precautions / Restrictions Precautions Precautions: Fall Restrictions LLE Weight Bearing: Weight bearing as tolerated    Mobility  Bed Mobility Overal bed mobility: Needs Assistance Bed Mobility: Supine to Sit     Supine to sit: Max assist;HOB elevated     General bed mobility comments: Pt is able to assist with advancement of R LE to the EOB, and demonstrates ability to use UE's to push herself fwd, however she is weak, and requires assistance from PT with bed pad to scoot hips to the EOB.    Transfers Overall transfer level: Needs assistance   Transfers: Sit to/from Stand Sit to Stand: Mod assist         General transfer comment: sit<>stand x 2 with STEDY.  Pt was able to get hips fwd towards the bar, and stand with supervision and use of the STEDY.  Pt was transferred to the [facility] where she  attempted to urinate, but was unable.  She was then transferred to the chair.    Ambulation/Gait                 Stairs            Wheelchair Mobility    Modified Rankin (Stroke Patients Only)       Balance Overall balance assessment: History of Falls;Needs assistance Sitting-balance support: Bilateral upper extremity supported;Feet supported Sitting balance-Leahy Scale: Good     Standing balance support: Bilateral upper extremity supported Standing balance-Leahy Scale: Poor                      Cognition Arousal/Alertness: Awake/alert Behavior During Therapy: WFL for tasks assessed/performed (improved from the past 2 days. Pt is more interactive today. )                   General Comments: improved abiltiy to follow commands, and improved initiation of movement today.     Exercises      General Comments        Pertinent Vitals/Pain Pain Assessment: Faces Faces Pain Scale: Hurts little more Pain Location: L hip Pain Descriptors / Indicators: Grimacing;Guarding Pain Intervention(s): Limited activity within patient's tolerance;Repositioned;Monitored during session    Home Living  Prior Function            PT Goals (current goals can now be found in the care plan section) Acute Rehab PT Goals Patient Stated Goal: To get strong enough to get back home. PT Goal Formulation: With patient Time For Goal Achievement: 07/23/16 Potential to Achieve Goals: Fair Progress towards PT goals: Progressing toward goals    Frequency    7X/week      PT Plan Current plan remains appropriate    Co-evaluation             End of Session Equipment Utilized During Treatment: Gait belt;Other (comment) (STEDY) Activity Tolerance: Patient limited by fatigue Patient left: in chair;with call bell/phone within reach;with family/visitor present     Time: 1147-1207 PT Time Calculation (min) (ACUTE ONLY): 20  min  Charges:  $Therapeutic Activity: 8-22 mins                    G Codes:      Beth Audrielle Vankuren, PT, DPT X: [phone]

## 2016-07-18 NOTE — Progress Notes (Signed)
Report was [facility] to [facility] to the accepting nurse.  She verbalized understanding. The patient was transferred via w/c with family by staff in stable condition.  I [facility] after they were signed.

## 2016-07-18 NOTE — Discharge Summary (Signed)
Physician Discharge Summary  [name] Oliver RUE:[id number] DOB: 13-Jan-1933 DOA: 07/14/2016  PCP: Dwana Melena, MD  Admit date: 07/14/2016 Discharge date: 07/18/2016  Admitted From: home Disposition:  [facility]  Recommendations for Outpatient Follow-up:  1. Follow up with PCP in 1-2 weeks 2. Please obtain BMP/CBC in one week 3. Patient will be discharged to SNF for further care 4. Staples out 07/29/16 5. WBAT 6. Follow up with Dr. Romeo Apple in 2 weeks 7. Direct lateral hip approach precautions 8. Pillow between legs for 6 weeks   Discharge Condition: stable CODE STATUS: full code Diet recommendation: Heart Healthy   Brief/Interim Summary: 83 year old female with history of Parkinson's disease and poor balance, presented after a mechanical fall. Have a Left Hip Fracture. Seen by Orthopedics and Underwent Operative Management on 2/8. Plan is for skilled nursing facility placement upon discharge.  Discharge Diagnoses:  Active Problems:   Fall   Parkinson's disease dementia (HCC)   Femoral neck fracture (HCC)   Hip fracture (HCC)   Closed fracture of neck of left femur (HCC)   Anemia  1. Left hip fracture. Patient was seen by orthopedics and underwent operative management on 2/8. Post operative course has been uneventful. Will continue with physical therapy. Continue pain management  2. Parkinson's disease. Continue on Sinemet.  3. Anxiety. Continue Xanax when necessary.  4. Anemia. Likely related to blood loss. Hemoglobin has been stable. Continue to monitor  5. Dementia related to Parkinson's. Appears to be stable.  Discharge Instructions  Discharge Instructions    Diet - low sodium heart healthy    Complete by:  As directed    Increase activity slowly    Complete by:  As directed      Allergies as of 07/18/2016      Reactions   Morphine And Related Other (See Comments)   Hallucinations   Prednisone Other (See Comments)   manic   Namzaric [memantine  Hcl-donepezil Hcl] Anxiety      Medication List    TAKE these medications   acetaminophen 500 MG tablet Commonly known as:  TYLENOL Take 1 tablet (500 mg total) by mouth every 6 (six) hours.   ALPRAZolam 0.25 MG tablet Commonly known as:  XANAX Take 1 tablet (0.25 mg total) by mouth 3 (three) times daily as needed for anxiety. What changed:  how much to take  how to take this  when to take this  reasons to take this   aspirin 325 MG EC tablet Take 1 tablet (325 mg total) by mouth daily with breakfast. Start taking on:  07/19/2016   carbidopa-levodopa 25-100 MG tablet Commonly known as:  SINEMET IR take 1 tablet by mouth twice a day   Cranberry Fruit 475 MG Caps Take 1 capsule by mouth 2 (two) times daily.   cyanocobalamin 1000 MCG/ML injection Commonly known as:  (VITAMIN B-12) Inject 1 mL (1,000 mcg total) into the muscle every 30 (thirty) days.   docusate sodium 100 MG capsule Commonly known as:  COLACE Take 1 capsule (100 mg total) by mouth 2 (two) times daily.   HYDROcodone-acetaminophen 5-325 MG tablet Commonly known as:  NORCO/VICODIN Take 1 tablet by mouth every 4 (four) hours as needed for moderate pain.   memantine 5 MG tablet Commonly known as:  NAMENDA Take 1 tablet (5 mg total) by mouth 2 (two) times daily.   methocarbamol 500 MG tablet Commonly known as:  ROBAXIN Take 1 tablet (500 mg total) by mouth every 6 (six) hours as needed  for muscle spasms.   mirtazapine 30 MG tablet Commonly known as:  REMERON Take 1 tablet (30 mg total) by mouth at bedtime.      Contact information for after-discharge care    Destination    [facility] .   Specialty:  Skilled Nursing Facility Contact information: [address] [place] [id number] [phone]             Allergies  Allergen Reactions  . Morphine And Related Other (See Comments)    Hallucinations  . Prednisone Other (See Comments)    manic  .  Namzaric [Memantine Hcl-Donepezil Hcl] Anxiety    Consultations:  orthopedics   Procedures/Studies: Dg Pelvis Portable  Result Date: 07/15/2016 CLINICAL DATA:  Status post LEFT hip replacement. LEFT femoral neck fracture. EXAM: PORTABLE PELVIS 1-2 VIEWS COMPARISON:  07/14/2016. FINDINGS: Patient is status post open reduction internal fixation of a LEFT femoral neck fracture with a bipolar prosthesis. Satisfactory position and alignment. Remote RIGHT THA. IMPRESSION: No adverse features. Electronically Signed   By: Elsie StainJohn T Curnes M.D.   On: 07/15/2016 16:34   Chest Portable 1 View  Result Date: 07/15/2016 CLINICAL DATA:  Left hip fracture after a fall. EXAM: PORTABLE CHEST 1 VIEW COMPARISON:  09/19/2013 FINDINGS: Shallow inspiration. Heart size and pulmonary vascularity are normal for technique. No focal airspace disease or consolidation in the lungs. No blunting of costophrenic angles. No pneumothorax. Calcified and tortuous aorta. IMPRESSION: Shallow inspiration.  No evidence of active pulmonary disease. Electronically Signed   By: Burman NievesWilliam  Stevens M.D.   On: 07/15/2016 01:36   Dg Hip Unilat W Or Wo Pelvis 2-3 Views Left  Result Date: 07/14/2016 CLINICAL DATA:  Hip pain with injury EXAM: DG HIP (WITH OR WITHOUT PELVIS) 2-3V LEFT COMPARISON:  None. FINDINGS: The patient is status post right hip replacement without acute dislocation. Apparent old right inferior pubic ramus fracture. Acute left femoral neck fracture with close to 1/2 bone with of superior displacement of distal fracture fragment. Left femoral head projects in joint. IMPRESSION: 1. Acute displaced left femoral neck fracture. Left femoral head projects in joint. 2. Status post right hip replacement without acute dislocation. Apparent old fracture deformity of the right inferior ramus. Electronically Signed   By: Jasmine PangKim  Fujinaga M.D.   On: 07/14/2016 23:01   Dg Femur Min 2 Views Left  Result Date: 07/14/2016 CLINICAL DATA:  Hip pain EXAM:  LEFT FEMUR 2 VIEWS COMPARISON:  07/14/2016 radiographs FINDINGS: The distal femur is imaged. No fracture or malalignment of the distal femur. IMPRESSION: Negative.  Please see separately dictated left hip report Electronically Signed   By: Jasmine PangKim  Fujinaga M.D.   On: 07/14/2016 23:02    BIPOLAR REPLACEMENT LEFT HIP (Left)    Subjective: Still has some back pain. Confusion is better.  Discharge Exam: Vitals:   07/17/16 2119 07/18/16 0442  BP: (!) 120/49 (!) 131/55  Pulse: 84 88  Resp: 16 16  Temp: 98.4 F (36.9 C) 98.2 F (36.8 C)   Vitals:   07/16/16 2220 07/17/16 1429 07/17/16 2119 07/18/16 0442  BP: 110/68 (!) 136/53 (!) 120/49 (!) 131/55  Pulse: (!) 101 93 84 88  Resp: 20 16 16 16   Temp:  97.6 F (36.4 C) 98.4 F (36.9 C) 98.2 F (36.8 C)  TempSrc: Oral Oral Oral Oral  SpO2: 98% 98% 100% 100%  Weight:      Height:        General: Pt is alert, awake, not in  acute distress Cardiovascular: RRR, S1/S2 +, no rubs, no gallops Respiratory: CTA bilaterally, no wheezing, no rhonchi Abdominal: Soft, NT, ND, bowel sounds + Extremities: no edema, no cyanosis    The results of significant diagnostics from this hospitalization (including imaging, microbiology, ancillary and laboratory) are listed below for reference.     Microbiology: Recent Results (from the past 240 hour(s))  MRSA PCR Screening     Status: None   Collection Time: 07/15/16 10:47 AM  Result Value Ref Range Status   MRSA by PCR NEGATIVE NEGATIVE Final    Comment:        The GeneXpert MRSA Assay (FDA approved for NASAL specimens only), is one component of a comprehensive MRSA colonization surveillance program. It is not intended to diagnose MRSA infection nor to guide or monitor treatment for MRSA infections.      Labs: BNP (last 3 results) No results for input(s): BNP in the last 8760 hours. Basic Metabolic Panel:  Recent Labs Lab 07/14/16 2210 07/16/16 0433 07/17/16 0604 07/18/16 0640  NA  140 137 137 138  K 3.9 3.9 3.9 3.7  CL 106 105 101 108  CO2 26 26 30 26   GLUCOSE 118* 103* 119* 101*  BUN 21* 14 14 15   CREATININE 0.85 0.69 0.81 0.70  CALCIUM 9.3 8.8* 8.8* 8.3*   Liver Function Tests: No results for input(s): AST, ALT, ALKPHOS, BILITOT, PROT, ALBUMIN in the last 168 hours. No results for input(s): LIPASE, AMYLASE in the last 168 hours. No results for input(s): AMMONIA in the last 168 hours. CBC:  Recent Labs Lab 07/14/16 2210 07/16/16 0433 07/17/16 0604 07/18/16 0640  WBC 9.7 9.3 11.2* 9.2  NEUTROABS 7.1  --   --   --   HGB 12.3 10.7* 10.5* 10.1*  HCT 36.3 31.9* 31.4* 30.3*  MCV 97.8 98.8 98.4 98.4  PLT 281 240 246 243   Cardiac Enzymes: No results for input(s): CKTOTAL, CKMB, CKMBINDEX, TROPONINI in the last 168 hours. BNP: Invalid input(s): POCBNP CBG: No results for input(s): GLUCAP in the last 168 hours. D-Dimer No results for input(s): DDIMER in the last 72 hours. Hgb A1c No results for input(s): HGBA1C in the last 72 hours. Lipid Profile No results for input(s): CHOL, HDL, LDLCALC, TRIG, CHOLHDL, LDLDIRECT in the last 72 hours. Thyroid function studies No results for input(s): TSH, T4TOTAL, T3FREE, THYROIDAB in the last 72 hours.  Invalid input(s): FREET3 Anemia work up No results for input(s): VITAMINB12, FOLATE, FERRITIN, TIBC, IRON, RETICCTPCT in the last 72 hours. Urinalysis    Component Value Date/Time   COLORURINE YELLOW 07/17/2016 1014   APPEARANCEUR HAZY (A) 07/17/2016 1014   LABSPEC 1.020 07/17/2016 1014   PHURINE 6.0 07/17/2016 1014   GLUCOSEU NEGATIVE 07/17/2016 1014   HGBUR NEGATIVE 07/17/2016 1014   BILIRUBINUR NEGATIVE 07/17/2016 1014   KETONESUR NEGATIVE 07/17/2016 1014   PROTEINUR NEGATIVE 07/17/2016 1014   UROBILINOGEN 0.2 09/24/2013 1000   NITRITE NEGATIVE 07/17/2016 1014   LEUKOCYTESUR TRACE (A) 07/17/2016 1014   Sepsis Labs Invalid input(s): PROCALCITONIN,  WBC,  LACTICIDVEN Microbiology Recent Results (from  the past 240 hour(s))  MRSA PCR Screening     Status: None   Collection Time: 07/15/16 10:47 AM  Result Value Ref Range Status   MRSA by PCR NEGATIVE NEGATIVE Final    Comment:        The GeneXpert MRSA Assay (FDA approved for NASAL specimens only), is one component of a comprehensive MRSA colonization surveillance program. It is not intended to  diagnose MRSA infection nor to guide or monitor treatment for MRSA infections.      Time coordinating discharge: Over 30 minutes  SIGNED:   Erick Blinks, MD  [facility] 07/18/2016, 12:00 PM Pager   If 7PM-7AM, please contact night-coverage [email or web address] Password [email or web address]

## 2016-07-19 ENCOUNTER — Non-Acute Institutional Stay (SKILLED_NURSING_FACILITY): Payer: Medicare Other | Admitting: Internal Medicine

## 2016-07-19 ENCOUNTER — Encounter (HOSPITAL_COMMUNITY)
Admission: RE | Admit: 2016-07-19 | Discharge: 2016-07-19 | Disposition: A | Discharge status: Home / Self Care | Payer: Medicare Other | Source: Skilled Nursing Facility | Attending: Internal Medicine | Admitting: Internal Medicine

## 2016-07-19 ENCOUNTER — Encounter (HOSPITAL_COMMUNITY): Payer: Self-pay | Admitting: Orthopedic Surgery

## 2016-07-19 DIAGNOSIS — S72002S Fracture of unspecified part of neck of left femur, sequela: Secondary | ICD-10-CM

## 2016-07-19 DIAGNOSIS — D649 Anemia, unspecified: Secondary | ICD-10-CM | POA: Diagnosis not present

## 2016-07-19 DIAGNOSIS — G20 Parkinson's disease: Secondary | ICD-10-CM | POA: Diagnosis not present

## 2016-07-19 DIAGNOSIS — G219 Secondary parkinsonism, unspecified: Secondary | ICD-10-CM | POA: Diagnosis not present

## 2016-07-19 DIAGNOSIS — F0280 Dementia in other diseases classified elsewhere without behavioral disturbance: Secondary | ICD-10-CM

## 2016-07-19 DIAGNOSIS — N390 Urinary tract infection, site not specified: Secondary | ICD-10-CM | POA: Insufficient documentation

## 2016-07-19 LAB — TYPE AND SCREEN
ABO/RH(D): A NEG
Antibody Screen: NEGATIVE
UNIT DIVISION: 0
UNIT DIVISION: 0

## 2016-07-19 LAB — URINALYSIS, ROUTINE W REFLEX MICROSCOPIC
Bilirubin Urine: NEGATIVE
Glucose, UA: NEGATIVE mg/dL
HGB URINE DIPSTICK: NEGATIVE
Ketones, ur: NEGATIVE mg/dL
Leukocytes, UA: NEGATIVE
Nitrite: NEGATIVE
PH: 6 (ref 5.0–8.0)
PROTEIN: NEGATIVE mg/dL
Specific Gravity, Urine: 1.016 (ref 1.005–1.030)

## 2016-07-19 NOTE — Progress Notes (Signed)
Provider:  Einar Crow Location:   [facility] Room Number: 129/P Place of Service:  SNF (31)  PCP: Dwana Melena, MD Patient Care Team: Benita Stabile, MD as PCP - General (Internal Medicine)  Extended Emergency Contact Information Primary Emergency Contact: Tollie Pizza [place] Home Phone: [phone] Relation: Daughter Secondary Emergency Contact: Nida,David Address: [address]          [place], [place] [address] [place] Home Phone: [phone] Relation: Spouse  Code Status: Full Code  Goals of Care: Advanced Directive information Advanced Directives 07/19/2016  Does Patient Have a Medical Advance Directive? Yes  Type of Advance Directive (No Data)  Does patient want to make changes to medical advance directive? No - Patient declined  Copy of Healthcare Power of Attorney in Chart? -  Would patient like information on creating a medical advance directive? No - Patient declined  Pre-existing out of facility DNR order (yellow form or pink MOST form) -      Chief Complaint  Patient presents with  . New Admit To SNF    HPI: Patient is a 83 y.o. female seen today for admission to SNF For therapy after ORIF for Left Hip Fracture. Patient has h/o Parkinson disease associated with Mild memory disorder, B12 deficiency and Gait abnormalities. Patient lives with her Husband who is 84 years old. She says she was trying to get into her Bathroom when she just fell. Does not remember having any LOC. She was found to have Left hip fracture underwent ORIF on 02/08. She did have some problems with Delirium after surgery. She is discharged to facility for therapy.  Patient lives with Her husband. According to the daughter does not have much family around to help. They both still drive. Patient was walking without any assistance before surgery but did have unsteady gait.  Past Medical History:  Diagnosis Date  . Chronic cough   .  Dementia   . Hip fracture (HCC)   . Memory difficulty 01/08/2014  . Pelvis fracture (HCC)   . Primary Parkinsonism [facility])    Past Surgical History:  Procedure Laterality Date  . ABDOMINAL HYSTERECTOMY    . ABDOMINAL HYSTERECTOMY    . BREAST LUMPECTOMY Right   . CATARACT EXTRACTION Bilateral    03/14/13  . FRACTURE SURGERY    . HIP ARTHROPLASTY Left 07/15/2016   Procedure: BIPOLAR REPLACEMENT LEFT HIP;  Surgeon: Vickki Hearing, MD;  Location: [facility];  Service: Orthopedics;  Laterality: Left;  . JOINT REPLACEMENT    . TOTAL HIP ARTHROPLASTY Right     reports that she has never smoked. She has never used smokeless tobacco. She reports that she does not drink alcohol or use drugs. Social History   Social History  . Marital status: Married    Spouse name: N/A  . Number of children: 2  . Years of education: BA   Occupational History  . Retired Retired  . RN    Social History Main Topics  . Smoking status: Never Smoker  . Smokeless tobacco: Never Used  . Alcohol use No  . Drug use: No  . Sexual activity: Not Currently   Other Topics Concern  . Not on file   Social History Narrative   Lives at home with her husband.   Patient is right handed.   Patient drinks 1 cup of caffeine daily.       Functional Status Survey:    Family History  Problem  Relation Age of Onset  . Dementia Mother   . Bipolar disorder Sister   . Parkinsonism Sister   . Diabetes Other     Health Maintenance  Topic Date Due  . TETANUS/TDAP  08/08/1953  . ZOSTAVAX  08/09/1994  . DEXA SCAN  08/09/1999  . INFLUENZA VACCINE  05/07/2017 (Originally 01/06/2016)  . PNA vac Low Risk Adult (1 of 2 - PCV13) 05/07/2017 (Originally 08/09/1999)    Allergies  Allergen Reactions  . Morphine And Related Other (See Comments)    Hallucinations  . Prednisone Other (See Comments)    manic  . Namzaric [Memantine Hcl-Donepezil Hcl] Anxiety    Allergies as of 07/19/2016      Reactions   Morphine And Related  Other (See Comments)   Hallucinations   Prednisone Other (See Comments)   manic   Namzaric [memantine Hcl-donepezil Hcl] Anxiety      Medication List       Accurate as of 07/19/16 11:20 AM. Always use your most recent med list.          acetaminophen 500 MG tablet Commonly known as:  TYLENOL Take 1 tablet (500 mg total) by mouth every 6 (six) hours.   ALPRAZolam 0.25 MG tablet Commonly known as:  XANAX Take 1 tablet (0.25 mg total) by mouth 3 (three) times daily as needed for anxiety.   aspirin 325 MG EC tablet Take 1 tablet (325 mg total) by mouth daily with breakfast.   carbidopa-levodopa 25-100 MG tablet Commonly known as:  SINEMET IR take 1 tablet by mouth twice a day   Cranberry Fruit 475 MG Caps Take 1 capsule by mouth 2 (two) times daily.   cyanocobalamin 1000 MCG/ML injection Commonly known as:  (VITAMIN B-12) Inject 1 mL (1,000 mcg total) into the muscle every 30 (thirty) days.   docusate sodium 100 MG capsule Commonly known as:  COLACE Take 1 capsule (100 mg total) by mouth 2 (two) times daily.   HYDROcodone-acetaminophen 5-325 MG tablet Commonly known as:  NORCO/VICODIN Take 1 tablet by mouth every 4 (four) hours as needed for moderate pain.   memantine 5 MG tablet Commonly known as:  NAMENDA Take 1 tablet (5 mg total) by mouth 2 (two) times daily.   methocarbamol 500 MG tablet Commonly known as:  ROBAXIN Take 1 tablet (500 mg total) by mouth every 6 (six) hours as needed for muscle spasms.   mirtazapine 30 MG tablet Commonly known as:  REMERON Take 1 tablet (30 mg total) by mouth at bedtime.   VENELEX Oint Apply to sacrum and bilateral buttocks q shift and prn       Review of Systems  Review of Systems  Constitutional: Negative for activity change, appetite change, chills, diaphoresis, fatigue and fever.  HENT: Negative for mouth sores, postnasal drip, rhinorrhea, sinus pain and sore throat.   Respiratory: Negative for apnea, Does have  Chronic dry cough, chest tightness, shortness of breath and wheezing.   Cardiovascular: Negative for chest pain, palpitations and leg swelling.  Gastrointestinal: Negative for abdominal distention, abdominal pain, constipation, diarrhea, nausea and vomiting.  Genitourinary: Negative for dysuria and frequency.  Musculoskeletal: Negative for arthralgias, joint swelling and myalgias.  Skin: Negative for rash.  Neurological: Negative for dizziness, syncope, weakness, light-headedness and numbness.  Psychiatric/Behavioral: Negative for behavioral problems, does have confusion and sleep disturbance.    Vitals:   07/19/16 1111  BP: 128/72  Pulse: 80  Resp: 18  Temp: 98 F (36.7 C)  TempSrc: Oral  There is no height or weight on file to calculate BMI. Physical Exam  Constitutional: She is oriented to person, place, and time. She appears well-developed and well-nourished.  HENT:  Head: Normocephalic.  Mouth/Throat: Oropharynx is clear and moist.  Eyes: Pupils are equal, round, and reactive to light.  Cardiovascular: Normal rate, regular rhythm and normal heart sounds.   No murmur heard. Pulmonary/Chest: Effort normal and breath sounds normal. No respiratory distress. She has no wheezes. She has no rales.  Abdominal: Soft. Bowel sounds are normal. She exhibits no distension. There is no tenderness. There is no rebound.  Musculoskeletal:  Has edema in both legs Right more then Left.  Lymphadenopathy:    She has no cervical adenopathy.  Neurological: She is alert and oriented to person, place, and time.  Was little slow in answering but got them all right. Moves her extremities well. No focal deficit.  Skin: Skin is warm and dry. No rash noted. No erythema. No pallor.  Psychiatric: She has a normal mood and affect. Her behavior is normal. Judgment and thought content normal.    Labs reviewed: Basic Metabolic Panel:  Recent Labs  16/10/96 0433 07/17/16 0604 07/18/16 0640  NA 137  137 138  K 3.9 3.9 3.7  CL 105 101 108  CO2 26 30 26   GLUCOSE 103* 119* 101*  BUN 14 14 15   CREATININE 0.69 0.81 0.70  CALCIUM 8.8* 8.8* 8.3*   Liver Function Tests: No results for input(s): AST, ALT, ALKPHOS, BILITOT, PROT, ALBUMIN in the last 8760 hours. No results for input(s): LIPASE, AMYLASE in the last 8760 hours. No results for input(s): AMMONIA in the last 8760 hours. CBC:  Recent Labs  07/14/16 2210 07/16/16 0433 07/17/16 0604 07/18/16 0640  WBC 9.7 9.3 11.2* 9.2  NEUTROABS 7.1  --   --   --   HGB 12.3 10.7* 10.5* 10.1*  HCT 36.3 31.9* 31.4* 30.3*  MCV 97.8 98.8 98.4 98.4  PLT 281 240 246 243   Cardiac Enzymes: No results for input(s): CKTOTAL, CKMB, CKMBINDEX, TROPONINI in the last 8760 hours. BNP: Invalid input(s): POCBNP No results found for: HGBA1C Lab Results  Component Value Date   TSH 3.810 09/21/2013   Lab Results  Component Value Date   VITAMINB12 264 09/21/2013   No results found for: FOLATE No results found for: IRON, TIBC, FERRITIN  Imaging and Procedures obtained prior to SNF admission: No results found.  Assessment/Plan  Closed fracture of neck of left femur Status Post repair. Patient doing well with walking with therapist. Her pain is controlled. Had long discussion with Her daughter and she does not want Any narcotics as they cause confusion Mrs [place]. She wants to keep trying Standing dose of Tylenol and Robaxin Prn for pain control Continue aspirin . Follow up with Ortho  Dementia due to Parkinson's disease  Per Daughter patient had Worsening confusion since her surgery. Will check UA. And repeat labs Discontinue Xanax. Discontinue Hydrocodone Use just Tylenol.  Parkinson Disease Continue on Sinemet Also On Namenda.  Anemia S/P Surgery Repeat to monitor.      Family/ staff Communication:   Labs/tests ordered:

## 2016-07-20 ENCOUNTER — Non-Acute Institutional Stay (SKILLED_NURSING_FACILITY): Payer: Medicare Other | Admitting: Internal Medicine

## 2016-07-20 ENCOUNTER — Other Ambulatory Visit (HOSPITAL_COMMUNITY)
Admission: AD | Admit: 2016-07-20 | Discharge: 2016-07-20 | Disposition: A | Discharge status: Home / Self Care | Payer: No Typology Code available for payment source | Source: Skilled Nursing Facility | Attending: Internal Medicine | Admitting: Internal Medicine

## 2016-07-20 ENCOUNTER — Encounter: Payer: Self-pay | Admitting: Internal Medicine

## 2016-07-20 DIAGNOSIS — D519 Vitamin B12 deficiency anemia, unspecified: Secondary | ICD-10-CM | POA: Insufficient documentation

## 2016-07-20 DIAGNOSIS — S72002S Fracture of unspecified part of neck of left femur, sequela: Secondary | ICD-10-CM

## 2016-07-20 DIAGNOSIS — N3000 Acute cystitis without hematuria: Secondary | ICD-10-CM

## 2016-07-20 DIAGNOSIS — R410 Disorientation, unspecified: Secondary | ICD-10-CM

## 2016-07-20 DIAGNOSIS — R112 Nausea with vomiting, unspecified: Secondary | ICD-10-CM

## 2016-07-20 DIAGNOSIS — J101 Influenza due to other identified influenza virus with other respiratory manifestations: Secondary | ICD-10-CM | POA: Insufficient documentation

## 2016-07-20 LAB — URINE CULTURE
CULTURE: NO GROWTH
Culture: >=100000 — AB

## 2016-07-20 LAB — COMPREHENSIVE METABOLIC PANEL
ALT: 85 U/L — AB (ref 14–54)
AST: 67 U/L — AB (ref 15–41)
Albumin: 3.4 g/dL — ABNORMAL LOW (ref 3.5–5.0)
Alkaline Phosphatase: 147 U/L — ABNORMAL HIGH (ref 38–126)
Anion gap: 9 (ref 5–15)
BILIRUBIN TOTAL: 0.9 mg/dL (ref 0.3–1.2)
BUN: 23 mg/dL — AB (ref 6–20)
CO2: 29 mmol/L (ref 22–32)
CREATININE: 0.76 mg/dL (ref 0.44–1.00)
Calcium: 9.1 mg/dL (ref 8.9–10.3)
Chloride: 103 mmol/L (ref 101–111)
Glucose, Bld: 108 mg/dL — ABNORMAL HIGH (ref 65–99)
Potassium: 3.6 mmol/L (ref 3.5–5.1)
Sodium: 141 mmol/L (ref 135–145)
TOTAL PROTEIN: 6.4 g/dL — AB (ref 6.5–8.1)

## 2016-07-20 LAB — CBC WITH DIFFERENTIAL/PLATELET
BASOS ABS: 0 10*3/uL (ref 0.0–0.1)
Basophils Relative: 0 %
Eosinophils Absolute: 0.1 10*3/uL (ref 0.0–0.7)
Eosinophils Relative: 0 %
HCT: 31.3 % — ABNORMAL LOW (ref 36.0–46.0)
Hemoglobin: 10.6 g/dL — ABNORMAL LOW (ref 12.0–15.0)
LYMPHS ABS: 1.1 10*3/uL (ref 0.7–4.0)
LYMPHS PCT: 8 %
MCH: 33.1 pg (ref 26.0–34.0)
MCHC: 33.9 g/dL (ref 30.0–36.0)
MCV: 97.8 fL (ref 78.0–100.0)
Monocytes Absolute: 1.3 10*3/uL — ABNORMAL HIGH (ref 0.1–1.0)
Monocytes Relative: 10 %
NEUTROS ABS: 11.2 10*3/uL — AB (ref 1.7–7.7)
Neutrophils Relative %: 82 %
Platelets: 435 10*3/uL — ABNORMAL HIGH (ref 150–400)
RBC: 3.2 MIL/uL — AB (ref 3.87–5.11)
RDW: 12.8 % (ref 11.5–15.5)
WBC: 13.6 10*3/uL — AB (ref 4.0–10.5)

## 2016-07-20 LAB — INFLUENZA PANEL BY PCR (TYPE A & B)
INFLBPCR: NEGATIVE
Influenza A By PCR: NEGATIVE

## 2016-07-20 LAB — TSH: TSH: 6.192 u[IU]/mL — AB (ref 0.350–4.500)

## 2016-07-20 NOTE — Progress Notes (Signed)
Location:  [facility]   Place of Service:  SNF (31) Provider:  Einar Crow MD  Dwana Melena, MD  Patient Care Team: Benita Stabile, MD as PCP - General (Internal Medicine)  Extended Emergency Contact Information Primary Emergency Contact: Tollie Pizza [place] Home Phone: [phone] Relation: Daughter Secondary Emergency Contact: [place],David Address: [address]          [place], [place] [address] [place] Home Phone: [phone] Relation: Spouse  Code Status:  Full Code Goals of care: Advanced Directive information Advanced Directives 07/19/2016  Does Patient Have a Medical Advance Directive? Yes  Type of Advance Directive (No Data)  Does patient want to make changes to medical advance directive? No - Patient declined  Copy of Healthcare Power of Attorney in Chart? -  Would patient like information on creating a medical advance directive? No - Patient declined  Pre-existing out of facility DNR order (yellow form or pink MOST form) -     Chief Complaint  Patient presents with  . Acute Visit    For Vomiting this morning    HPI:  Pt is a 83 y.o. female seen today for an acute visit for Episode of Vomiting this morning and continued confusion. Patient has H/O parkinson Disease with Mild Memory disorder, B12 Deficiency and Gait abnormalities. She is admitted to SNF for therapy after ORIF for Left Hip Fracture. Patient family has been saying that she is confused more then Usual. She Also had one  episode of vomiting this morning. Yesterday we had D'ced all her new meds including Hydrocodone, Robaxin and Xanax. She is now only in Tylenol for pain control. Patient's pain has been controlled on Tylenol only. She Denied any abdominal pain. No more Nausea or any more episode of vomiting. She did get one dose of Phenergan and when I initially saw her she was little confused and sleepy. She denies any Cough or Chest pain. No fever or  chills.   Past Medical History:  Diagnosis Date  . Chronic cough   . Dementia   . Hip fracture (HCC)   . Memory difficulty 01/08/2014  . Pelvis fracture (HCC)   . Primary Parkinsonism [facility])    Past Surgical History:  Procedure Laterality Date  . ABDOMINAL HYSTERECTOMY    . ABDOMINAL HYSTERECTOMY    . BREAST LUMPECTOMY Right   . CATARACT EXTRACTION Bilateral    03/14/13  . FRACTURE SURGERY    . HIP ARTHROPLASTY Left 07/15/2016   Procedure: BIPOLAR REPLACEMENT LEFT HIP;  Surgeon: Vickki Hearing, MD;  Location: [facility];  Service: Orthopedics;  Laterality: Left;  . JOINT REPLACEMENT    . TOTAL HIP ARTHROPLASTY Right     Allergies  Allergen Reactions  . Morphine And Related Other (See Comments)    Hallucinations  . Prednisone Other (See Comments)    manic  . Namzaric [Memantine Hcl-Donepezil Hcl] Anxiety    Allergies as of 07/20/2016      Reactions   Morphine And Related Other (See Comments)   Hallucinations   Prednisone Other (See Comments)   manic   Namzaric [memantine Hcl-donepezil Hcl] Anxiety      Medication List       Accurate as of 07/20/16  4:33 PM. Always use your most recent med list.          acetaminophen 500 MG tablet Commonly known as:  TYLENOL Take 1 tablet (500 mg total) by mouth every 6 (six) hours.   aspirin  325 MG EC tablet Take 1 tablet (325 mg total) by mouth daily with breakfast.   carbidopa-levodopa 25-100 MG tablet Commonly known as:  SINEMET IR take 1 tablet by mouth twice a day   Cranberry Fruit 475 MG Caps Take 1 capsule by mouth 2 (two) times daily.   cyanocobalamin 1000 MCG/ML injection Commonly known as:  (VITAMIN B-12) Inject 1 mL (1,000 mcg total) into the muscle every 30 (thirty) days.   docusate sodium 100 MG capsule Commonly known as:  COLACE Take 1 capsule (100 mg total) by mouth 2 (two) times daily.   memantine 5 MG tablet Commonly known as:  NAMENDA Take 1 tablet (5 mg total) by mouth 2 (two) times daily.    mirtazapine 30 MG tablet Commonly known as:  REMERON Take 1 tablet (30 mg total) by mouth at bedtime.   VENELEX Oint Apply to sacrum and bilateral buttocks q shift and prn       Review of Systems  Constitutional: Positive for activity change, appetite change and fatigue. Negative for chills, diaphoresis and fever.  HENT: Positive for congestion. Negative for rhinorrhea, sinus pain and sore throat.   Respiratory: Negative.   Cardiovascular: Positive for leg swelling.  Gastrointestinal: Negative for abdominal distention, abdominal pain, constipation, diarrhea and nausea.  Genitourinary: Negative for difficulty urinating, dysuria, frequency and urgency.  Musculoskeletal: Negative.   Skin: Negative.   Neurological: Positive for weakness. Negative for dizziness, light-headedness and numbness.  Psychiatric/Behavioral: Positive for confusion and sleep disturbance. The patient is nervous/anxious.      There is no immunization history on file for this patient. Pertinent  Health Maintenance Due  Topic Date Due  . DEXA SCAN  08/09/1999  . INFLUENZA VACCINE  05/07/2017 (Originally 01/06/2016)  . PNA vac Low Risk Adult (1 of 2 - PCV13) 05/07/2017 (Originally 08/09/1999)   No flowsheet data found. Functional Status Survey:    Vitals:   07/20/16 1601  BP: (!) 160/90  Pulse: 91  Temp: 98.7 F (37.1 C)  SpO2: 96%   There is no height or weight on file to calculate BMI. Physical Exam  Constitutional: She appears well-developed and well-nourished.  HENT:  Head: Normocephalic.  Mouth/Throat: Oropharynx is clear and moist.  Eyes: Pupils are equal, round, and reactive to light.  Neck: Neck supple.  Cardiovascular: Normal heart sounds.  Tachycardia present.   Pulmonary/Chest: Effort normal and breath sounds normal. No respiratory distress. She has no wheezes. She has no rales.  Abdominal: Soft. Bowel sounds are normal. She exhibits no distension. There is no tenderness. There is no  rebound.  Musculoskeletal:  Bilateral Edema.  Neurological: She is alert.  She was oriented . No focal deficit.  Skin: Skin is warm and dry.  Incision site is healing well with no signs of infection.  Psychiatric: She has a normal mood and affect.  Patient initially was talking like she is confused but when I went to reexamine her after her therapy she was much more clearer. Initially she had just received Phenergan.    Labs reviewed:  Recent Labs  07/17/16 0604 07/18/16 0640 07/20/16 1050  NA 137 138 141  K 3.9 3.7 3.6  CL 101 108 103  CO2 30 26 29   GLUCOSE 119* 101* 108*  BUN 14 15 23*  CREATININE 0.81 0.70 0.76  CALCIUM 8.8* 8.3* 9.1    Recent Labs  07/20/16 1050  AST 67*  ALT 85*  ALKPHOS 147*  BILITOT 0.9  PROT 6.4*  ALBUMIN 3.4*  Recent Labs  07/14/16 2210  07/17/16 0604 07/18/16 0640 07/20/16 1050  WBC 9.7  < > 11.2* 9.2 13.6*  NEUTROABS 7.1  --   --   --  11.2*  HGB 12.3  < > 10.5* 10.1* 10.6*  HCT 36.3  < > 31.4* 30.3* 31.3*  MCV 97.8  < > 98.4 98.4 97.8  PLT 281  < > 246 243 435*  < > = values in this interval not displayed. Lab Results  Component Value Date   TSH 6.192 (H) 07/20/2016   No results found for: HGBA1C No results found for: CHOL, HDL, LDLCALC, LDLDIRECT, TRIG, CHOLHDL  Significant Diagnostic Results in last 30 days:  Dg Pelvis Portable  Result Date: 07/15/2016 CLINICAL DATA:  Status post LEFT hip replacement. LEFT femoral neck fracture. EXAM: PORTABLE PELVIS 1-2 VIEWS COMPARISON:  07/14/2016. FINDINGS: Patient is status post open reduction internal fixation of a LEFT femoral neck fracture with a bipolar prosthesis. Satisfactory position and alignment. Remote RIGHT THA. IMPRESSION: No adverse features. Electronically Signed   By: Elsie Stain M.D.   On: 07/15/2016 16:34   Chest Portable 1 View  Result Date: 07/15/2016 CLINICAL DATA:  Left hip fracture after a fall. EXAM: PORTABLE CHEST 1 VIEW COMPARISON:  09/19/2013 FINDINGS:  Shallow inspiration. Heart size and pulmonary vascularity are normal for technique. No focal airspace disease or consolidation in the lungs. No blunting of costophrenic angles. No pneumothorax. Calcified and tortuous aorta. IMPRESSION: Shallow inspiration.  No evidence of active pulmonary disease. Electronically Signed   By: Burman Nieves M.D.   On: 07/15/2016 01:36   Dg Hip Unilat W Or Wo Pelvis 2-3 Views Left  Result Date: 07/14/2016 CLINICAL DATA:  Hip pain with injury EXAM: DG HIP (WITH OR WITHOUT PELVIS) 2-3V LEFT COMPARISON:  None. FINDINGS: The patient is status post right hip replacement without acute dislocation. Apparent old right inferior pubic ramus fracture. Acute left femoral neck fracture with close to 1/2 bone with of superior displacement of distal fracture fragment. Left femoral head projects in joint. IMPRESSION: 1. Acute displaced left femoral neck fracture. Left femoral head projects in joint. 2. Status post right hip replacement without acute dislocation. Apparent old fracture deformity of the right inferior ramus. Electronically Signed   By: Jasmine Pang M.D.   On: 07/14/2016 23:01   Dg Femur Min 2 Views Left  Result Date: 07/14/2016 CLINICAL DATA:  Hip pain EXAM: LEFT FEMUR 2 VIEWS COMPARISON:  07/14/2016 radiographs FINDINGS: The distal femur is imaged. No fracture or malalignment of the distal femur. IMPRESSION: Negative.  Please see separately dictated left hip report Electronically Signed   By: Jasmine Pang M.D.   On: 07/14/2016 23:02    Assessment/Plan UTI Patient did have urine culture grew both Citrobacter and Enterococcus. Since patient is confused and has mild Leucocytosis will start her on Cipro 250 mg BID  Delirium Have stopped all her new Meds. Will treat for UTI. Do not suspect any other Infective site. But will continue to follow her Vitals Q 4hours.  Nausea and vomiting Just had one episode and it resolved with Phenergan. Patient had full lunch with no  more vomiting. No abdominal pain. Continue to monitor  Closed fracture of neck of left femur Pain Controlled with Tylenol. Did therapy today. They said she had some trouble with Following Complex Commands. Bilateral Edema Will order Dopplers as patient says she did not have this much edema before.   Family/ staff Communication: D/w Therapy, and her husband  Labs/tests ordered:  Repeat CBC , CMP  Total time spent in this patient care encounter was 35_ minutes; greater than 50% of the visit spent counseling patient and coordinating care for problems addressed at this encounter.

## 2016-07-21 ENCOUNTER — Encounter: Payer: Self-pay | Admitting: Internal Medicine

## 2016-07-21 ENCOUNTER — Non-Acute Institutional Stay (SKILLED_NURSING_FACILITY): Payer: Medicare Other | Admitting: Internal Medicine

## 2016-07-21 DIAGNOSIS — N390 Urinary tract infection, site not specified: Secondary | ICD-10-CM

## 2016-07-21 DIAGNOSIS — R112 Nausea with vomiting, unspecified: Secondary | ICD-10-CM

## 2016-07-21 DIAGNOSIS — D72829 Elevated white blood cell count, unspecified: Secondary | ICD-10-CM

## 2016-07-21 NOTE — Progress Notes (Signed)
Location:   [facility] Room Number: 129/P Place of Service:  SNF [phone]) Provider:  Phylis Bougie, MD  Patient Care Team: Benita Stabile, MD as PCP - General (Internal Medicine)  Extended Emergency Contact Information Primary Emergency Contact: Tollie Pizza [place] Home Phone: [phone] Relation: Daughter Secondary Emergency Contact: Steen,David Address: [address]          [place], [place] [address] [place] Home Phone: [phone] Relation: Spouse  Code Status: Full Code Goals of care: Advanced Directive information Advanced Directives 07/21/2016  Does Patient Have a Medical Advance Directive? Yes  Type of Advance Directive (No Data)  Does patient want to make changes to medical advance directive? No - Patient declined  Copy of Healthcare Power of Attorney in Chart? -  Would patient like information on creating a medical advance directive? No - Patient declined  Pre-existing out of facility DNR order (yellow form or pink MOST form) -     Chief Complaint  Patient presents with  . Acute Visit  Follow-up leukocytosis with suspected UTI-also follow-up of nausea and vomiting  HPI:  Pt is a 83 y.o. female seen today for an acute visit for follow-up with Dr. Urban Gibson visit yesterday with patient-at that point patient had nausea and vomiting-Dr. Chales Abrahams did review her recent urine culture which showed Citrobacter and enterococcus and she has been started on Cipro.  She also had mild leukocytosis with a white count slightly above 13,000.  Today she says she is feeling better she actually participated with therapy nursing staff feels that she has improved some patient feels this way as well although she still complains of weakness.  She is not complaining of nausea or vomiting today she ate some breakfast although not a great amount she is not complaining of dysuria currently.   I note she has a urine culture  dated February 12 which does not show any growth however 1 on February 10 appears to have grown out 2 organisms.  She is here for rehabilitation after sustaining a left hip fracture that was repaired she is not currently complaining of pain does complain of weakness but says she's feeling better today than she did yesterday      Past Medical History:  Diagnosis Date  . Chronic cough   . Dementia   . Hip fracture (HCC)   . Memory difficulty 01/08/2014  . Pelvis fracture (HCC)   . Primary Parkinsonism [facility])    Past Surgical History:  Procedure Laterality Date  . ABDOMINAL HYSTERECTOMY    . ABDOMINAL HYSTERECTOMY    . BREAST LUMPECTOMY Right   . CATARACT EXTRACTION Bilateral    03/14/13  . FRACTURE SURGERY    . HIP ARTHROPLASTY Left 07/15/2016   Procedure: BIPOLAR REPLACEMENT LEFT HIP;  Surgeon: Vickki Hearing, MD;  Location: [facility];  Service: Orthopedics;  Laterality: Left;  . JOINT REPLACEMENT    . TOTAL HIP ARTHROPLASTY Right     Allergies  Allergen Reactions  . Morphine And Related Other (See Comments)    Hallucinations  . Prednisone Other (See Comments)    manic  . Namzaric [Memantine Hcl-Donepezil Hcl] Anxiety    Allergies as of 07/21/2016      Reactions   Morphine And Related Other (See Comments)   Hallucinations   Prednisone Other (See Comments)   manic   Namzaric [memantine Hcl-donepezil Hcl] Anxiety      Medication List  Accurate as of 07/21/16 12:02 PM. Always use your most recent med list.          acetaminophen 500 MG tablet Commonly known as:  TYLENOL Take 500 mg by mouth every 6 (six) hours as needed.   acetaminophen 500 MG tablet Commonly known as:  TYLENOL Take 1 tablet (500 mg total) by mouth every 6 (six) hours.   aspirin 325 MG EC tablet Take 1 tablet (325 mg total) by mouth daily with breakfast.   carbidopa-levodopa 25-100 MG tablet Commonly known as:  SINEMET IR take 1 tablet by mouth twice a day   ciprofloxacin 250 MG  tablet Commonly known as:  CIPRO Take 250 mg by mouth 2 (two) times daily. For 7 days until 07/26/16   Cranberry Fruit 475 MG Caps Take 1 capsule by mouth 2 (two) times daily.   cyanocobalamin 1000 MCG/ML injection Commonly known as:  (VITAMIN B-12) Inject 1 mL (1,000 mcg total) into the muscle every 30 (thirty) days.   docusate sodium 100 MG capsule Commonly known as:  COLACE Take 1 capsule (100 mg total) by mouth 2 (two) times daily.   memantine 5 MG tablet Commonly known as:  NAMENDA Take 1 tablet (5 mg total) by mouth 2 (two) times daily.   mirtazapine 30 MG tablet Commonly known as:  REMERON Take 1 tablet (30 mg total) by mouth at bedtime.   VENELEX Oint Apply to sacrum and bilateral buttocks q shift and prn       Review of Systems   In general does not complaining of any fever chills or dysuria since she feels weak but says she is feeling gradually a bit better than she did a couple days ago denies any nausea or vomiting today.  Skin is not complaining of diaphoresis rashes or itching.  Ears eyes nose mouth and throat does not complain of any sore throat or visual changes.  Marland Kitchen.  Respiratory is not complaining of shortness breath or cough.  Cardiac does not complain of any chest pain does have some leg swelling of venous Doppler is pending of lower extremities.  GI does not complain nausea vomiting or abdominal discomfort today did have some nausea and vomiting yesterday.  GU does not complaining of overt dysuria.  Muscle skeletal does not complain of pain does complain of weakness.  Neurologic is not complaining of dizziness headache or syncope.  In psych has had some delirium medication changes were made previously she is alert and appropriate today I do not note any overt anxiety she appears to be comfortably lying in bed. Earlier this week her Xanax was discontinued secondary to complaints of delirium also hydrocodone was DC'd she is receiving Tylenol now for  pain  She is not overtly complaining of any depression or anxiety today   There is no immunization history on file for this patient. Pertinent  Health Maintenance Due  Topic Date Due  . DEXA SCAN  08/09/1999  . INFLUENZA VACCINE  05/07/2017 (Originally 01/06/2016)  . PNA vac Low Risk Adult (1 of 2 - PCV13) 05/07/2017 (Originally 08/09/1999)   No flowsheet data found. Functional Status Survey:   Temp 97.7 pulse 89 respirations 18 blood pressure 111/43   Physical Exam   In general this is a pleasant LE female in no distress resting comfortably in bed she is frail appearing but in no distress.  Her skin is warm and dry she does have covering over her left hip surgical site I do not see any surrounding  erythema or evidence of drainage through the dressing.  Eyes pupils appear reactive to light sclera injected are clear visual acuity appears grossly intact.  Oropharynx clear mucous membranes moist.  Chest is clear to auscultation there is no labored breathing.  Heart is regular rate and rhythm without murmur gallop or rub she does have edema of both legs pedal pulses appear to be intact and palpable again updated Dopplers are pending.  Abdomen is soft nontender there are are positive bowel sounds.  GU could not really appreciate suprapubic distention or acute tenderness here.  Muscleskeletal does move her upper extremities has significant weakness lower extremities  Very limited range of motion left leg status post hip surgery.  Neurologic is grossly intact her speech is clear do not really appreciate lateralizing findings.  Psych she is pleasant and appropriate appear to be alert oriented and conversant although somewhat weak.    Labs reviewed:  Recent Labs  07/17/16 0604 07/18/16 0640 07/20/16 1050  NA 137 138 141  K 3.9 3.7 3.6  CL 101 108 103  CO2 30 26 29   GLUCOSE 119* 101* 108*  BUN 14 15 23*  CREATININE 0.81 0.70 0.76  CALCIUM 8.8* 8.3* 9.1    Recent  Labs  07/20/16 1050  AST 67*  ALT 85*  ALKPHOS 147*  BILITOT 0.9  PROT 6.4*  ALBUMIN 3.4*    Recent Labs  07/14/16 2210  07/17/16 0604 07/18/16 0640 07/20/16 1050  WBC 9.7  < > 11.2* 9.2 13.6*  NEUTROABS 7.1  --   --   --  11.2*  HGB 12.3  < > 10.5* 10.1* 10.6*  HCT 36.3  < > 31.4* 30.3* 31.3*  MCV 97.8  < > 98.4 98.4 97.8  PLT 281  < > 246 243 435*  < > = values in this interval not displayed. Lab Results  Component Value Date   TSH 6.192 (H) 07/20/2016   No results found for: HGBA1C No results found for: CHOL, HDL, LDLCALC, LDLDIRECT, TRIG, CHOLHDL  Significant Diagnostic Results in last 30 days:  Dg Pelvis Portable  Result Date: 07/15/2016 CLINICAL DATA:  Status post LEFT hip replacement. LEFT femoral neck fracture. EXAM: PORTABLE PELVIS 1-2 VIEWS COMPARISON:  07/14/2016. FINDINGS: Patient is status post open reduction internal fixation of a LEFT femoral neck fracture with a bipolar prosthesis. Satisfactory position and alignment. Remote RIGHT THA. IMPRESSION: No adverse features. Electronically Signed   By: Elsie Stain M.D.   On: 07/15/2016 16:34   Chest Portable 1 View  Result Date: 07/15/2016 CLINICAL DATA:  Left hip fracture after a fall. EXAM: PORTABLE CHEST 1 VIEW COMPARISON:  09/19/2013 FINDINGS: Shallow inspiration. Heart size and pulmonary vascularity are normal for technique. No focal airspace disease or consolidation in the lungs. No blunting of costophrenic angles. No pneumothorax. Calcified and tortuous aorta. IMPRESSION: Shallow inspiration.  No evidence of active pulmonary disease. Electronically Signed   By: Burman Nieves M.D.   On: 07/15/2016 01:36   Dg Hip Unilat W Or Wo Pelvis 2-3 Views Left  Result Date: 07/14/2016 CLINICAL DATA:  Hip pain with injury EXAM: DG HIP (WITH OR WITHOUT PELVIS) 2-3V LEFT COMPARISON:  None. FINDINGS: The patient is status post right hip replacement without acute dislocation. Apparent old right inferior pubic ramus fracture.  Acute left femoral neck fracture with close to 1/2 bone with of superior displacement of distal fracture fragment. Left femoral head projects in joint. IMPRESSION: 1. Acute displaced left femoral neck fracture. Left femoral head projects  in joint. 2. Status post right hip replacement without acute dislocation. Apparent old fracture deformity of the right inferior ramus. Electronically Signed   By: Jasmine Pang M.D.   On: 07/14/2016 23:01   Dg Femur Min 2 Views Left  Result Date: 07/14/2016 CLINICAL DATA:  Hip pain EXAM: LEFT FEMUR 2 VIEWS COMPARISON:  07/14/2016 radiographs FINDINGS: The distal femur is imaged. No fracture or malalignment of the distal femur. IMPRESSION: Negative.  Please see separately dictated left hip report Electronically Signed   By: Jasmine Pang M.D.   On: 07/14/2016 23:02    Assessment/Plan #1 nausea and vomiting-this appears to have resolved she says she's feeling a bit better today in this regards.  Regards to suspected UTI again 2 organisms did grow out on lab done earlier this week-somewhat confusing that updated culture did not show significant growth nonetheless she appeared to be somewhat symptomatic with elevated white count and has been started on Cipro cc she is feeling better today although still weak.  Will update lab work including a CBC with differential and metabolic panel tomorrow to keep an eye on this.  #2 bilateral leg edema-skin venous Doppler is pending we'll await those results.  .  [id number]      London Sheer, CMA [phone]

## 2016-07-22 ENCOUNTER — Encounter: Payer: Self-pay | Admitting: Internal Medicine

## 2016-07-22 ENCOUNTER — Encounter (HOSPITAL_COMMUNITY)
Admission: RE | Admit: 2016-07-22 | Discharge: 2016-07-22 | Disposition: A | Discharge status: Home / Self Care | Payer: Medicare Other | Source: Skilled Nursing Facility | Attending: *Deleted | Admitting: *Deleted

## 2016-07-22 ENCOUNTER — Non-Acute Institutional Stay (SKILLED_NURSING_FACILITY): Payer: Medicare Other | Admitting: Internal Medicine

## 2016-07-22 DIAGNOSIS — N3000 Acute cystitis without hematuria: Secondary | ICD-10-CM | POA: Diagnosis not present

## 2016-07-22 DIAGNOSIS — Z8781 Personal history of (healed) traumatic fracture: Secondary | ICD-10-CM

## 2016-07-22 LAB — COMPREHENSIVE METABOLIC PANEL
ALBUMIN: 3.2 g/dL — AB (ref 3.5–5.0)
ALT: 14 U/L (ref 14–54)
ANION GAP: 9 (ref 5–15)
AST: 73 U/L — ABNORMAL HIGH (ref 15–41)
Alkaline Phosphatase: 209 U/L — ABNORMAL HIGH (ref 38–126)
BILIRUBIN TOTAL: 0.8 mg/dL (ref 0.3–1.2)
BUN: 25 mg/dL — ABNORMAL HIGH (ref 6–20)
CO2: 25 mmol/L (ref 22–32)
Calcium: 8.8 mg/dL — ABNORMAL LOW (ref 8.9–10.3)
Chloride: 107 mmol/L (ref 101–111)
Creatinine, Ser: 0.73 mg/dL (ref 0.44–1.00)
GFR calc Af Amer: >60 mL/min (ref >60)
GFR calc non Af Amer: >60 mL/min (ref >60)
GLUCOSE: 99 mg/dL (ref 65–99)
POTASSIUM: 3.5 mmol/L (ref 3.5–5.1)
SODIUM: 141 mmol/L (ref 135–145)
TOTAL PROTEIN: 6.2 g/dL — AB (ref 6.5–8.1)

## 2016-07-22 LAB — CBC WITH DIFFERENTIAL/PLATELET
BASOS PCT: 0 %
Basophils Absolute: 0 10*3/uL (ref 0.0–0.1)
EOS ABS: 0.4 10*3/uL (ref 0.0–0.7)
EOS PCT: 3 %
HEMATOCRIT: 30.4 % — AB (ref 36.0–46.0)
Hemoglobin: 10.1 g/dL — ABNORMAL LOW (ref 12.0–15.0)
Lymphocytes Relative: 14 %
Lymphs Abs: 1.7 10*3/uL (ref 0.7–4.0)
MCH: 33 pg (ref 26.0–34.0)
MCHC: 33.2 g/dL (ref 30.0–36.0)
MCV: 99.3 fL (ref 78.0–100.0)
MONO ABS: 1.2 10*3/uL — AB (ref 0.1–1.0)
MONOS PCT: 10 %
Neutro Abs: 8.6 10*3/uL — ABNORMAL HIGH (ref 1.7–7.7)
Neutrophils Relative %: 73 %
Platelets: 466 10*3/uL — ABNORMAL HIGH (ref 150–400)
RBC: 3.06 MIL/uL — ABNORMAL LOW (ref 3.87–5.11)
RDW: 12.9 % (ref 11.5–15.5)
WBC: 11.9 10*3/uL — ABNORMAL HIGH (ref 4.0–10.5)

## 2016-07-22 NOTE — Progress Notes (Signed)
Location:   [facility] Room Number: 129/P Place of Service:  SNF [phone]) Provider:  Phylis Bougie, MD  Patient Care Team: Benita Stabile, MD as PCP - General (Internal Medicine)  Extended Emergency Contact Information Primary Emergency Contact: Tollie Pizza [place] Home Phone: [phone] Relation: Daughter Secondary Emergency Contact: Kissinger,David Address: [address]          [place], [place] [address] [place] Home Phone: [phone] Relation: Spouse  Code Status:  Full Code Goals of care: Advanced Directive information Advanced Directives 07/22/2016  Does Patient Have a Medical Advance Directive? Yes  Type of Advance Directive (No Data)  Does patient want to make changes to medical advance directive? No - Patient declined  Copy of Healthcare Power of Attorney in Chart? -  Would patient like information on creating a medical advance directive? No - Patient declined  Pre-existing out of facility DNR order (yellow form or pink MOST form) -     Chief Complaint  Patient presents with  . Acute Visit    Hip complaint after fall    HPI:  Pt is a 83 y.o. female seen today for an acute visit for Complaints of left hip pain.  Apparently she was trying to get out of bed and fell and did complain of the hip discomfort.  She does have a history of a left hip fracture and is actually here for rehabilitation after the repair.  Her pain medications and anxiety medications were minimized secondary to confusion she still has some of this.  At this point is receiving Tylenol for pain.  Of note she is also being treated for a UTI--with Cipro-.  Her daughter feels that the confusion may be caused by the UTI since she presented similarly apparently with previous UTIs.  Dr. Chales Abrahams this suggest possibly doing an MRI or CT scan to rule out any acute pathology-I did discuss this with the daughter however who would prefer no  further imaging at this time thinking that more than likely it is the UTI causing her confusion.  We have obtained a mobile x-ray which is somewhat confusing saying the final conclusion-impression- there is a left hip repair with no complications but also mentions in the initial findings possibility of acute fracture-one would wonder whether they're referring to the old fracture that was surgically repaired but this will have to be clarified       Past Medical History:  Diagnosis Date  . Chronic cough   . Dementia   . Hip fracture (HCC)   . Memory difficulty 01/08/2014  . Pelvis fracture (HCC)   . Primary Parkinsonism [facility])    Past Surgical History:  Procedure Laterality Date  . ABDOMINAL HYSTERECTOMY    . ABDOMINAL HYSTERECTOMY    . BREAST LUMPECTOMY Right   . CATARACT EXTRACTION Bilateral    03/14/13  . FRACTURE SURGERY    . HIP ARTHROPLASTY Left 07/15/2016   Procedure: BIPOLAR REPLACEMENT LEFT HIP;  Surgeon: Vickki Hearing, MD;  Location: [facility];  Service: Orthopedics;  Laterality: Left;  . JOINT REPLACEMENT    . TOTAL HIP ARTHROPLASTY Right     Allergies  Allergen Reactions  . Morphine And Related Other (See Comments)    Hallucinations  . Prednisone Other (See Comments)    manic  . Namzaric [Memantine Hcl-Donepezil Hcl] Anxiety    Current Outpatient Prescriptions on File Prior to Visit  Medication Sig Dispense Refill  .  acetaminophen (TYLENOL) 500 MG tablet Take 1 tablet (500 mg total) by mouth every 6 (six) hours. 30 tablet 0  . acetaminophen (TYLENOL) 500 MG tablet Take 500 mg by mouth every 6 (six) hours as needed.    Marland Kitchen. aspirin EC 325 MG EC tablet Take 1 tablet (325 mg total) by mouth daily with breakfast. 30 tablet 0  . Balsam [place]-Castor Oil (VENELEX) OINT Apply to sacrum and bilateral buttocks q shift and prn    . carbidopa-levodopa (SINEMET IR) 25-100 MG tablet take 1 tablet by mouth twice a day 60 tablet 11  . ciprofloxacin (CIPRO) 250 MG tablet Take 250 mg  by mouth 2 (two) times daily. For 7 days until 07/26/16    . Cranberry Fruit 475 MG CAPS Take 1 capsule by mouth 2 (two) times daily.    . cyanocobalamin (,VITAMIN B-12,) 1000 MCG/ML injection Inject 1 mL (1,000 mcg total) into the muscle every 30 (thirty) days. 10 mL 3  . docusate sodium (COLACE) 100 MG capsule Take 1 capsule (100 mg total) by mouth 2 (two) times daily. 10 capsule 0  . memantine (NAMENDA) 5 MG tablet Take 1 tablet (5 mg total) by mouth 2 (two) times daily. 60 tablet 5  . mirtazapine (REMERON) 30 MG tablet Take 1 tablet (30 mg total) by mouth at bedtime. 30 tablet 11   No current facility-administered medications on file prior to visit.      Review of Systems   This is somewhat limited since patient is a somewhat poor historian.  Gen. she is not complaining of fever chills but says she feels very weak and fatigued.  Skin is not complaining of rashes or itching.  Eyes is not complaining of visual changes.  Here nose mouth and throat does not really complaining of any sore throat.  Cardiac does not complaining of chest pain she does have lower extremity edema.  Straight is not complaining of increased shortness of breath or cough.  GI is not really complaining of abdominal discomfort nausea or vomiting.  GU is being treated for a UTI but does not overtly complain of dysuria.  Muscle skeletal is complaining of some left leg hip joint pain.  Neurologic is not complaining of dizziness headache or numbness.  Psych again she has had some increased confusion.    There is no immunization history on file for this patient. Pertinent  Health Maintenance Due  Topic Date Due  . DEXA SCAN  08/09/1999  . INFLUENZA VACCINE  05/07/2017 (Originally 01/06/2016)  . PNA vac Low Risk Adult (1 of 2 - PCV13) 05/07/2017 (Originally 08/09/1999)   No flowsheet data found. Functional Status Survey:    Vitals:   07/22/16 1610  BP: 131/63  Pulse: 79  Resp: 16  Temp: 98.2 F (36.8  C)  TempSrc: Oral  SpO2: 100%    Physical Exam    In general this is a pleasant [place] female in no acute distress resting  in bed she is frail appearing   Her skin is warm and dry she does have covering over her left hip surgical site I do not see any surrounding erythema or evidence of drainage through the dressing.  Eyes pupils appear reactive to light sclera injected are clear visual acuity appears grossly intact.  Oropharynx clear mucous membranes moist.  Chest is clear to auscultation there is no labored breathing.  Heart is regular rate and rhythm without murmur gallop or rub she does have edema of both legs pedal pulses appear  to be intact and palpable pedal pulses--  Abdomen is soft nontender there are are positive bowel sounds.  GU could not really appreciate suprapubic distention or acute tenderness here.  Muscleskeletal does move her upper extremities has significant weakness lower extremities  Holds  left leg in somewhat of a contracted position there is pain with any attempt at movement  Neurologic is grossly intact her speech is clear do not really appreciate lateralizing findings.  Psych she is pleasant and appropriate appear to be alert oriented and conversant although  weak.  Labs reviewed:  Recent Labs  07/18/16 0640 07/20/16 1050 07/22/16 0830  NA 138 141 141  K 3.7 3.6 3.5  CL 108 103 107  CO2 26 29 25   GLUCOSE 101* 108* 99  BUN 15 23* 25*  CREATININE 0.70 0.76 0.73  CALCIUM 8.3* 9.1 8.8*    Recent Labs  07/20/16 1050 07/22/16 0830  AST 67* 73*  ALT 85* 14  ALKPHOS 147* 209*  BILITOT 0.9 0.8  PROT 6.4* 6.2*  ALBUMIN 3.4* 3.2*    Recent Labs  07/14/16 2210  07/18/16 0640 07/20/16 1050 07/22/16 0830  WBC 9.7  < > 9.2 13.6* 11.9*  NEUTROABS 7.1  --   --  11.2* 8.6*  HGB 12.3  < > 10.1* 10.6* 10.1*  HCT 36.3  < > 30.3* 31.3* 30.4*  MCV 97.8  < > 98.4 97.8 99.3  PLT 281  < > 243 435* 466*  < > = values in this  interval not displayed. Lab Results  Component Value Date   TSH 6.192 (H) 07/20/2016   No results found for: HGBA1C No results found for: CHOL, HDL, LDLCALC, LDLDIRECT, TRIG, CHOLHDL  Significant Diagnostic Results in last 30 days:  Dg Pelvis Portable  Result Date: 07/15/2016 CLINICAL DATA:  Status post LEFT hip replacement. LEFT femoral neck fracture. EXAM: PORTABLE PELVIS 1-2 VIEWS COMPARISON:  07/14/2016. FINDINGS: Patient is status post open reduction internal fixation of a LEFT femoral neck fracture with a bipolar prosthesis. Satisfactory position and alignment. Remote RIGHT THA. IMPRESSION: No adverse features. Electronically Signed   By: Elsie Stain M.D.   On: 07/15/2016 16:34   Chest Portable 1 View  Result Date: 07/15/2016 CLINICAL DATA:  Left hip fracture after a fall. EXAM: PORTABLE CHEST 1 VIEW COMPARISON:  09/19/2013 FINDINGS: Shallow inspiration. Heart size and pulmonary vascularity are normal for technique. No focal airspace disease or consolidation in the lungs. No blunting of costophrenic angles. No pneumothorax. Calcified and tortuous aorta. IMPRESSION: Shallow inspiration.  No evidence of active pulmonary disease. Electronically Signed   By: Burman Nieves M.D.   On: 07/15/2016 01:36   Dg Hip Unilat W Or Wo Pelvis 2-3 Views Left  Result Date: 07/14/2016 CLINICAL DATA:  Hip pain with injury EXAM: DG HIP (WITH OR WITHOUT PELVIS) 2-3V LEFT COMPARISON:  None. FINDINGS: The patient is status post right hip replacement without acute dislocation. Apparent old right inferior pubic ramus fracture. Acute left femoral neck fracture with close to 1/2 bone with of superior displacement of distal fracture fragment. Left femoral head projects in joint. IMPRESSION: 1. Acute displaced left femoral neck fracture. Left femoral head projects in joint. 2. Status post right hip replacement without acute dislocation. Apparent old fracture deformity of the right inferior ramus. Electronically Signed    By: Jasmine Pang M.D.   On: 07/14/2016 23:01   Dg Femur Min 2 Views Left  Result Date: 07/14/2016 CLINICAL DATA:  Hip pain EXAM: LEFT FEMUR 2  VIEWS COMPARISON:  07/14/2016 radiographs FINDINGS: The distal femur is imaged. No fracture or malalignment of the distal femur. IMPRESSION: Negative.  Please see separately dictated left hip report Electronically Signed   By: Jasmine Pang M.D.   On: 07/14/2016 23:02    Assessment/Plan  1 left hip pain status post fall-again x-ray is somewhat unclear going by the final conclusion impressionone would  think there are no complications however in the initial impression section-mention something about an acute fracture-we are trying to seek clarification on this. Nursing has contacted the radiology service  for further information At this point she will need to be monitored for pain again she has not done well apparently with stronger pain medications and these have been minimize-I suspect this may be a challenging situation.  #2 history of confusion-UTI-I did have a fairly extensive discussion with her daughter via phone she says her mother has presented this way previously with UTIs she is completing a course of Cipro-white count has gone down somewhat on lab done today-at this point this will have to be monitored at this point her daughter did not really want a follow-up MRI or CT scan --at this point will continue to monitor.--Also will try to obtain an updated urine culture once the antibiotic is completed  [id number]      London Sheer, [place] [phone]

## 2016-07-23 ENCOUNTER — Telehealth: Payer: Self-pay | Admitting: Orthopedic Surgery

## 2016-07-23 ENCOUNTER — Emergency Department (HOSPITAL_COMMUNITY)
Admission: EM | Admit: 2016-07-23 | Discharge: 2016-07-23 | Disposition: A | Discharge status: Home / Self Care | Payer: Medicare Other | Source: Home / Self Care | Attending: Emergency Medicine | Admitting: Emergency Medicine

## 2016-07-23 ENCOUNTER — Emergency Department (HOSPITAL_COMMUNITY): Payer: Medicare Other

## 2016-07-23 ENCOUNTER — Encounter (HOSPITAL_COMMUNITY): Payer: Self-pay | Admitting: Emergency Medicine

## 2016-07-23 ENCOUNTER — Other Ambulatory Visit: Payer: Self-pay | Admitting: Orthopedic Surgery

## 2016-07-23 DIAGNOSIS — Y929 Unspecified place or not applicable: Secondary | ICD-10-CM | POA: Insufficient documentation

## 2016-07-23 DIAGNOSIS — Z79899 Other long term (current) drug therapy: Secondary | ICD-10-CM

## 2016-07-23 DIAGNOSIS — Y999 Unspecified external cause status: Secondary | ICD-10-CM | POA: Insufficient documentation

## 2016-07-23 DIAGNOSIS — W19XXXA Unspecified fall, initial encounter: Secondary | ICD-10-CM

## 2016-07-23 DIAGNOSIS — Z471 Aftercare following joint replacement surgery: Secondary | ICD-10-CM | POA: Diagnosis not present

## 2016-07-23 DIAGNOSIS — Z7982 Long term (current) use of aspirin: Secondary | ICD-10-CM | POA: Insufficient documentation

## 2016-07-23 DIAGNOSIS — Y9389 Activity, other specified: Secondary | ICD-10-CM

## 2016-07-23 DIAGNOSIS — F0390 Unspecified dementia without behavioral disturbance: Secondary | ICD-10-CM | POA: Insufficient documentation

## 2016-07-23 DIAGNOSIS — Z96642 Presence of left artificial hip joint: Secondary | ICD-10-CM | POA: Diagnosis not present

## 2016-07-23 DIAGNOSIS — G20 Parkinson's disease: Secondary | ICD-10-CM

## 2016-07-23 DIAGNOSIS — S72092A Other fracture of head and neck of left femur, initial encounter for closed fracture: Secondary | ICD-10-CM

## 2016-07-23 DIAGNOSIS — T84011A Broken internal left hip prosthesis, initial encounter: Secondary | ICD-10-CM | POA: Diagnosis not present

## 2016-07-23 DIAGNOSIS — S299XXA Unspecified injury of thorax, initial encounter: Secondary | ICD-10-CM | POA: Diagnosis not present

## 2016-07-23 DIAGNOSIS — M9702XA Periprosthetic fracture around internal prosthetic left hip joint, initial encounter: Secondary | ICD-10-CM

## 2016-07-23 LAB — CBC WITH DIFFERENTIAL/PLATELET
Basophils Absolute: 0 10*3/uL (ref 0.0–0.1)
Basophils Relative: 0 %
EOS ABS: 0.1 10*3/uL (ref 0.0–0.7)
Eosinophils Relative: 1 %
HEMATOCRIT: 31.4 % — AB (ref 36.0–46.0)
HEMOGLOBIN: 10.5 g/dL — AB (ref 12.0–15.0)
LYMPHS ABS: 1.4 10*3/uL (ref 0.7–4.0)
Lymphocytes Relative: 10 %
MCH: 32.9 pg (ref 26.0–34.0)
MCHC: 33.4 g/dL (ref 30.0–36.0)
MCV: 98.4 fL (ref 78.0–100.0)
MONO ABS: 1.6 10*3/uL — AB (ref 0.1–1.0)
MONOS PCT: 12 %
Neutro Abs: 10.7 10*3/uL — ABNORMAL HIGH (ref 1.7–7.7)
Neutrophils Relative %: 77 %
Platelets: 503 10*3/uL — ABNORMAL HIGH (ref 150–400)
RBC: 3.19 MIL/uL — ABNORMAL LOW (ref 3.87–5.11)
RDW: 12.8 % (ref 11.5–15.5)
WBC: 13.9 10*3/uL — ABNORMAL HIGH (ref 4.0–10.5)

## 2016-07-23 LAB — BASIC METABOLIC PANEL
Anion gap: 7 (ref 5–15)
BUN: 24 mg/dL — AB (ref 6–20)
CHLORIDE: 104 mmol/L (ref 101–111)
CO2: 27 mmol/L (ref 22–32)
CREATININE: 0.67 mg/dL (ref 0.44–1.00)
Calcium: 9 mg/dL (ref 8.9–10.3)
GFR calc non Af Amer: >60 mL/min (ref >60)
GLUCOSE: 119 mg/dL — AB (ref 65–99)
Potassium: 3.5 mmol/L (ref 3.5–5.1)
Sodium: 138 mmol/L (ref 135–145)

## 2016-07-23 LAB — TROPONIN I: TROPONIN I: 0.03 ng/mL — AB (ref <0.03)

## 2016-07-23 LAB — PROTIME-INR
INR: 1.06
PROTHROMBIN TIME: 13.8 s (ref 11.4–15.2)

## 2016-07-23 LAB — TYPE AND SCREEN
ABO/RH(D): A NEG
Antibody Screen: NEGATIVE

## 2016-07-23 MED ORDER — ACETAMINOPHEN 500 MG PO TABS
ORAL_TABLET | ORAL | Status: AC
  Administered 2016-07-23: 1000 mg via ORAL
  Filled 2016-07-23: qty 2

## 2016-07-23 MED ORDER — ACETAMINOPHEN 500 MG PO TABS
1000.0000 mg | ORAL_TABLET | Freq: Once | ORAL | Status: AC
  Administered 2016-07-23: 1000 mg via ORAL

## 2016-07-23 NOTE — Telephone Encounter (Signed)
[facility], relaying that patient had another fall; states having diagnostic "Mobile" Xray done, and forwarding report for Dr Romeo AppleHarrison to review and advise.Call back to direct #[phone]. *  *Per Dr Mort SawyersHarrison's response upon reviewing faxed copy of Xray report - advised "[facility]. Get new Xrays."  Called back and notified [facility].

## 2016-07-23 NOTE — ED Provider Notes (Signed)
[facility]PT Provider Note   CSN: [id number] Arrival date & time: 07/23/16  1311     History   Chief Complaint Chief Complaint  Patient presents with  . Hip Pain    HPI [name] Oliver is a 83 y.o. female.  The history is provided by the patient, the EMS personnel, the nursing home and a relative. The history is limited by the condition of the patient (Hx dementia).  Hip Pain   Pt was seen at 1335. Per EMS, NH report, family and pt: NH states pt fell yesterday when she got up and attempted to walk. Pt c/o left hip pain since the fall. XR were completed at the facility, dx possible fx. Ortho Dr. Romeo AppleHarrison requested facility send pt to the ED for further XR. Pt herself has significant hx of dementia.    Past Medical History:  Diagnosis Date  . Chronic cough   . Dementia   . Hip fracture (HCC)   . Memory difficulty 01/08/2014  . Pelvis fracture (HCC)   . Primary Parkinsonism [facility](HCC)     Patient Active Problem List   Diagnosis Date Noted  . Anemia 07/16/2016  . Femoral neck fracture (HCC) 07/15/2016  . Closed fracture of neck of left femur (HCC)   . Chronic fatigue 06/14/2016  . Memory difficulty 01/08/2014  . Parkinson's disease dementia (HCC) 10/28/2013  . Abnormality of gait 09/26/2013  . Parkinsonism (HCC) 09/26/2013  . UTI (urinary tract infection) 09/24/2013  . Urinary retention 09/23/2013  . Unspecified constipation 09/23/2013  . Accelerated hypertension 09/20/2013  . Fall 09/19/2013  . Pelvic fracture (HCC) 09/19/2013  . Leukocytosis 09/19/2013    Past Surgical History:  Procedure Laterality Date  . ABDOMINAL HYSTERECTOMY    . ABDOMINAL HYSTERECTOMY    . BREAST LUMPECTOMY Right   . CATARACT EXTRACTION Bilateral    03/14/13  . FRACTURE SURGERY    . HIP ARTHROPLASTY Left 07/15/2016   Procedure: BIPOLAR REPLACEMENT LEFT HIP;  Surgeon: Vickki HearingStanley E Harrison, MD;  Location: [facility] ORS;  Service: Orthopedics;  Laterality: Left;  . JOINT REPLACEMENT    . TOTAL HIP  ARTHROPLASTY Right     OB History    Gravida Para Term Preterm AB Living   3 2 2   1      SAB TAB Ectopic Multiple Live Births   1               Home Medications    Prior to Admission medications   Medication Sig Start Date End Date Taking? Authorizing Provider  acetaminophen (TYLENOL) 500 MG tablet Take 1 tablet (500 mg total) by mouth every 6 (six) hours. 07/18/16   Erick BlinksJehanzeb Memon, MD  acetaminophen (TYLENOL) 500 MG tablet Take 500 mg by mouth every 6 (six) hours as needed.    Historical Provider, MD  aspirin EC 325 MG EC tablet Take 1 tablet (325 mg total) by mouth daily with breakfast. 07/19/16   Erick BlinksJehanzeb Memon, MD  Lucilla LameBalsam Peru-Castor Oil [facility](VENELEX) OINT Apply to sacrum and bilateral buttocks q shift and prn    Historical Provider, MD  carbidopa-levodopa (SINEMET IR) 25-100 MG tablet take 1 tablet by mouth twice a day 03/18/16   York Spanielharles K Willis, MD  ciprofloxacin (CIPRO) 250 MG tablet Take 250 mg by mouth 2 (two) times daily. For 7 days until 07/26/16    Historical Provider, MD  Cranberry Fruit 475 MG CAPS Take 1 capsule by mouth 2 (two) times daily.    Historical Provider, MD  cyanocobalamin (,VITAMIN  B-12,) 1000 MCG/ML injection Inject 1 mL (1,000 mcg total) into the muscle every 30 (thirty) days. 03/11/16   York Spaniel, MD  docusate sodium (COLACE) 100 MG capsule Take 1 capsule (100 mg total) by mouth 2 (two) times daily. 07/18/16   Erick Blinks, MD  memantine (NAMENDA) 5 MG tablet Take 1 tablet (5 mg total) by mouth 2 (two) times daily. 07/14/16   York Spaniel, MD  mirtazapine (REMERON) 30 MG tablet Take 1 tablet (30 mg total) by mouth at bedtime. 11/10/15   York Spaniel, MD    Family History Family History  Problem Relation Age of Onset  . Dementia Mother   . Bipolar disorder Sister   . Parkinsonism Sister   . Diabetes Other     Social History Social History  Substance Use Topics  . Smoking status: Never Smoker  . Smokeless tobacco: Never Used  . Alcohol use  No     Allergies   Morphine and related; Prednisone; and Namzaric [memantine hcl-donepezil hcl]   Review of Systems Review of Systems  Unable to perform ROS: Dementia     Physical Exam Updated Vital Signs BP 117/56 (BP Location: Right Arm)   Pulse 87   Temp 98 F (36.7 C) (Oral)   Resp 18   Ht 5\' 2"  (1.575 m)   Wt 123 lb (55.8 kg)   SpO2 95%   BMI 22.50 kg/m   Physical Exam 1340: Physical examination:  Nursing notes reviewed; Vital signs and O2 SAT reviewed;  Constitutional: Well developed, Well nourished, Well hydrated, In no acute distress; Head:  Normocephalic, atraumatic; Eyes: EOMI, PERRL, No scleral icterus; ENMT: Mouth and pharynx normal, Mucous membranes moist; Neck: Supple, Full range of motion, No lymphadenopathy; Cardiovascular: Regular rate and rhythm, No gallop; Respiratory: Breath sounds clear & equal bilaterally, No wheezes.  Speaking full sentences with ease, Normal respiratory effort/excursion; Chest: Nontender, Movement normal; Abdomen: Soft, Nontender, Nondistended, Normal bowel sounds; Genitourinary: No CVA tenderness; Extremities: Pulses normal, Pelvis stable. +TTP left hip, no obvious deformity. No tenderness left knee/ankle/foot. No edema, No calf edema or asymmetry.; Neuro: Awake, alert, confused per hx dementia. Major CN grossly intact.  No facial droop. Speech clear. Decreased ROM left hip, otherwise moves extremities on stretcher spontaneously.; Skin: Color normal, Warm, Dry.   ED Treatments / Results  Labs (all labs ordered are listed, but only abnormal results are displayed)   EKG  EKG Interpretation  Date/Time:  Friday July 23 2016 14:55:35 EST Ventricular Rate:  92 PR Interval:    QRS Duration: 111 QT Interval:  368 QTC Calculation: 456 R Axis:   -52 Text Interpretation:  Sinus rhythm Ventricular premature complex LAD, consider left anterior fascicular block When compared with ECG of 07/14/2016 No significant change was found Confirmed  by [facility]  MD, Nicholos Johns [phone]) on 07/23/2016 3:31:20 PM       Radiology   Procedures Procedures (including critical care time)  Medications Ordered in ED Medications - No data to display   Initial Impression / Assessment and Plan / ED Course  I have reviewed the triage vital signs and the nursing notes.  Pertinent labs & imaging results that were available during my care of the patient were reviewed by me and considered in my medical decision making (see chart for details).  MDM Reviewed: previous chart, nursing note and vitals Interpretation: x-ray   Results for orders placed or performed during the hospital encounter of 07/23/16  Basic metabolic panel  Result Value Ref  Range   Sodium 138 135 - 145 mmol/L   Potassium 3.5 3.5 - 5.1 mmol/L   Chloride 104 101 - 111 mmol/L   CO2 27 22 - 32 mmol/L   Glucose, Bld 119 (H) 65 - 99 mg/dL   BUN 24 (H) 6 - 20 mg/dL   Creatinine, Ser 2.44 0.44 - 1.00 mg/dL   Calcium 9.0 8.9 - 01.0 mg/dL   GFR calc non Af Amer >60 >60 mL/min   GFR calc Af Amer >60 >60 mL/min   Anion gap 7 5 - 15  CBC with Differential  Result Value Ref Range   WBC 13.9 (H) 4.0 - 10.5 K/uL   RBC 3.19 (L) 3.87 - 5.11 MIL/uL   Hemoglobin 10.5 (L) 12.0 - 15.0 g/dL   HCT 27.2 (L) 53.6 - 64.4 %   MCV 98.4 78.0 - 100.0 fL   MCH 32.9 26.0 - 34.0 pg   MCHC 33.4 30.0 - 36.0 g/dL   RDW 03.4 74.2 - 59.5 %   Platelets 503 (H) 150 - 400 K/uL   Neutrophils Relative % 77 %   Neutro Abs 10.7 (H) 1.7 - 7.7 K/uL   Lymphocytes Relative 10 %   Lymphs Abs 1.4 0.7 - 4.0 K/uL   Monocytes Relative 12 %   Monocytes Absolute 1.6 (H) 0.1 - 1.0 K/uL   Eosinophils Relative 1 %   Eosinophils Absolute 0.1 0.0 - 0.7 K/uL   Basophils Relative 0 %   Basophils Absolute 0.0 0.0 - 0.1 K/uL  Troponin I  Result Value Ref Range   Troponin I 0.03 (HH) <0.03 ng/mL  Protime-INR  Result Value Ref Range   Prothrombin Time 13.8 11.4 - 15.2 seconds   INR 1.06   Type and screen [facility]  Result Value Ref Range   ABO/RH(D) A NEG    Antibody Screen NEG    Sample Expiration 07/26/2016     Dg Hip Unilat W Or Wo Pelvis 2-3 Views Left Result Date: 07/23/2016 CLINICAL DATA:  Status post left hip hemiarthroplasty for repair of left proximal femur fracture on 07/15/2016. Recurrent fall yesterday. Left hip pain. EXAM: DG HIP (WITH OR WITHOUT PELVIS) 2-3V LEFT COMPARISON:  07/15/2016 pelvic radiograph. FINDINGS: Re- demonstration of left hip hemiarthroplasty. No dislocation at the left hip joint. There is a new periprosthetic left proximal femoral metaphysis fracture with a new large 8 mm medially displaced butterfly fragment involving the lesser trochanter. No evidence of left proximal femoral prosthetic loosening. Skin staples lateral to the left hip. Soft tissue swelling lateral to the left hip. Stable healed deformity in the right pubic rami. No pelvic diastasis. Diffuse osteopenia. Prominent spondylosis in the visualized lower lumbar spine. No suspicious focal osseous lesions. Partially visualized right total hip arthroplasty. IMPRESSION: 1. New periprosthetic left proximal femoral metaphysis fracture with a new large medially displaced butterfly fragment involving the lesser trochanter. 2. No hip dislocation. Electronically Signed   By: Delbert Phenix M.D.   On: 07/23/2016 14:12   Dg Chest Port 1 View Result Date: 07/23/2016 CLINICAL DATA:  Fall. EXAM: PORTABLE CHEST 1 VIEW COMPARISON:  07/15/2006 FINDINGS: The heart size and mediastinal contours are within normal limits. Mild chronic pulmonary interstitial prominence. There is no evidence of pulmonary edema, consolidation, pneumothorax, nodule or pleural fluid. The visualized skeletal structures are unremarkable. IMPRESSION: No active disease. Electronically Signed   By: Irish Lack M.D.   On: 07/23/2016 15:08     1430:  T/C to Ortho Dr. Romeo Apple, case  discussed, including:  HPI, pertinent PM/SHx, VS/PE, dx testing, ED course  and treatment:  Requests to obtain pre-op labs/EKG/CXR and d/c back to NH with bedrest instructions, will likely operate early next week (Mon-Wed). Dx and testing, as well as d/w Ortho MD, d/w pt's family.  Questions answered.  Verb understanding. Family concerned regarding sending pt back to NH without side-rails, bed alarm, etc. Agreed with pt's family to hold opiates at this time (pt is comfortable on stretcher); dose APAP for pain prn. T/C to Child psychotherapist regarding pt's family safety concerns at NH: she called NH and confirmed pt's family's statements, family would need to hire own sitter. Family aware.   1725:  Troponin top of normal range; of doubtful significance given unchanged EKG and pt denies CP. Pt's family aware; OK with d/c back to NH with outpt f/u.   Final Clinical Impressions(s) / ED Diagnoses   Final diagnoses:  None    New Prescriptions New Prescriptions   No medications on file      Samuel Jester, DO 07/26/16 1540

## 2016-07-23 NOTE — ED Notes (Signed)
CRITICAL VALUE ALERT  Critical value received:  0.03 troponin  Date of notification:  07/23/16  Time of notification:  1713  Critical value read back:Yes.    Nurse who received alert:  c Tatyana Biber rn Responding MD:  Dr Clarene Dukemcmanus  Time MD responded:  [phone]

## 2016-07-23 NOTE — ED Triage Notes (Signed)
Daughter reports pt sent from the [facility] for evaluation of fall yesterday.  Mobile xray done yesterday and showed possible fx.  Dr. Romeo AppleHarrison wanted eval done for this today.

## 2016-07-23 NOTE — Discharge Instructions (Signed)
Take over the counter tylenol, as directed on packaging, as needed for discomfort.  Apply moist heat or ice to the area(s) of discomfort, for 15 minutes at a time, several times per day for the next few days.  Do not fall asleep on a heating or ice pack.  Remain on bedrest until you are seen in follow up by your Orthopedic doctor.  Call your regular Orthopedic doctor on Monday to schedule a follow up appointment in the next 2 days.  Return to the Emergency Department immediately if worsening.

## 2016-07-23 NOTE — ED Notes (Signed)
Dr Clarene Dukemcmanus in with talking with daughter

## 2016-07-23 NOTE — ED Notes (Signed)
Daughter reqeusting tylenol prior to dc. edp aware

## 2016-07-24 ENCOUNTER — Non-Acute Institutional Stay (SKILLED_NURSING_FACILITY): Payer: Medicare Other | Admitting: Internal Medicine

## 2016-07-24 DIAGNOSIS — N390 Urinary tract infection, site not specified: Secondary | ICD-10-CM

## 2016-07-24 DIAGNOSIS — S72002G Fracture of unspecified part of neck of left femur, subsequent encounter for closed fracture with delayed healing: Secondary | ICD-10-CM

## 2016-07-25 ENCOUNTER — Encounter (HOSPITAL_COMMUNITY)
Admission: RE | Admit: 2016-07-25 | Discharge: 2016-07-25 | Disposition: A | Discharge status: Home / Self Care | Payer: Medicare Other | Source: Skilled Nursing Facility | Attending: Internal Medicine | Admitting: Internal Medicine

## 2016-07-25 DIAGNOSIS — D519 Vitamin B12 deficiency anemia, unspecified: Secondary | ICD-10-CM | POA: Insufficient documentation

## 2016-07-25 LAB — CBC WITH DIFFERENTIAL/PLATELET
Basophils Absolute: 0.1 10*3/uL (ref 0.0–0.1)
Basophils Relative: 0 %
EOS ABS: 0.2 10*3/uL (ref 0.0–0.7)
EOS PCT: 1 %
HCT: 28.9 % — ABNORMAL LOW (ref 36.0–46.0)
Hemoglobin: 9.6 g/dL — ABNORMAL LOW (ref 12.0–15.0)
Lymphocytes Relative: 11 %
Lymphs Abs: 1.5 10*3/uL (ref 0.7–4.0)
MCH: 32.9 pg (ref 26.0–34.0)
MCHC: 33.2 g/dL (ref 30.0–36.0)
MCV: 99 fL (ref 78.0–100.0)
MONO ABS: 1.4 10*3/uL — AB (ref 0.1–1.0)
Monocytes Relative: 11 %
Neutro Abs: 10.3 10*3/uL — ABNORMAL HIGH (ref 1.7–7.7)
Neutrophils Relative %: 77 %
PLATELETS: 540 10*3/uL — AB (ref 150–400)
RBC: 2.92 MIL/uL — AB (ref 3.87–5.11)
RDW: 12.8 % (ref 11.5–15.5)
WBC: 13.4 10*3/uL — AB (ref 4.0–10.5)

## 2016-07-25 LAB — COMPREHENSIVE METABOLIC PANEL
ALT: 33 U/L (ref 14–54)
ANION GAP: 6 (ref 5–15)
AST: 71 U/L — ABNORMAL HIGH (ref 15–41)
Albumin: 2.7 g/dL — ABNORMAL LOW (ref 3.5–5.0)
Alkaline Phosphatase: 281 U/L — ABNORMAL HIGH (ref 38–126)
BUN: 21 mg/dL — ABNORMAL HIGH (ref 6–20)
CO2: 27 mmol/L (ref 22–32)
CREATININE: 0.82 mg/dL (ref 0.44–1.00)
Calcium: 8.8 mg/dL — ABNORMAL LOW (ref 8.9–10.3)
Chloride: 108 mmol/L (ref 101–111)
Glucose, Bld: 123 mg/dL — ABNORMAL HIGH (ref 65–99)
POTASSIUM: 4.1 mmol/L (ref 3.5–5.1)
SODIUM: 141 mmol/L (ref 135–145)
Total Bilirubin: 0.3 mg/dL (ref 0.3–1.2)
Total Protein: 5.6 g/dL — ABNORMAL LOW (ref 6.5–8.1)

## 2016-07-25 NOTE — Progress Notes (Signed)
This is an acute visit.  Level care skilled.  Facility is [facility].  Chief complaint-acute visit follow-up left hip fracture with possible complications.  History of present illness.  Patient is a pleasant 83 year old female who was in the facility for rehabilitation after sustaining a left hip fracture that was surgically repaired.  She subsequently had a fall at the facility-initially she had a mobile x-ray done which was somewhat confusing with final impression saying there was no complications with the left hip repair-but did have in the body of the findings mention of a possible acute fracture.  Patient's orthopedic surgeon Dr. Romeo AppleHarrison was contacted and he did order fixed side x-rays in the ER.--This did confirm a new periprosthetic left proximal femoral metaphysis fracture with a new large medially displaced butterfly fragment involving the lesser trochanter.  There was no hip dislocation.  Apparently Dr. Romeo AppleHarrison will do follow-up surgery at some point next week-in the meantime she has return to the facility.  She is complaining of pain here again medications have been minimized secondary to her confusion--I did discuss this with her daughter at bedside today however and we will try to start something a bit stronger than Tylenol-.  Her vital signs appear to be stable-she is resting in bed comfortably but does have pain at times.  She is completing a course of Cipro for suspected UTI-labs were done in the hospital as well as a chest x-ray-the chest x-ray did not show any acute process- Labs showed a continued elevated white count of 13,900 which is slightly above previous level-her troponin also was borderline at 0.03 cardiology did evaluate this apparently with no aggressive intervention.   Past Medical History:  Diagnosis Date  . Chronic cough   . Dementia   . Hip fracture (HCC)   . Memory difficulty 01/08/2014  . Pelvis fracture (HCC)   . Primary Parkinsonism  [facility](HCC)         Past Surgical History:  Procedure Laterality Date  . ABDOMINAL HYSTERECTOMY    . ABDOMINAL HYSTERECTOMY    . BREAST LUMPECTOMY Right   . CATARACT EXTRACTION Bilateral    03/14/13  . FRACTURE SURGERY    . HIP ARTHROPLASTY Left 07/15/2016   Procedure: BIPOLAR REPLACEMENT LEFT HIP;  Surgeon: Vickki HearingStanley E Harrison, MD;  Location: [facility] OR[facility];  Service: Orthopedics;  Laterality: Left;  . JOINT REPLACEMENT    . TOTAL HIP ARTHROPLASTY Right          Allergies  Allergen Reactions  . Morphine And Related Other (See Comments)    Hallucinations  . Prednisone Other (See Comments)    manic  . Namzaric [Memantine Hcl-Donepezil Hcl] Anxiety          Current Outpatient Prescriptions on File Prior to Visit  Medication Sig Dispense Refill  . acetaminophen (TYLENOL) 500 MG tablet Take 1 tablet (500 mg total) by mouth every 6 (six) hours. 30 tablet 0  . acetaminophen (TYLENOL) 500 MG tablet Take 500 mg by mouth every 6 (six) hours as needed.    Marland Kitchen. aspirin EC 325 MG EC tablet Take 1 tablet (325 mg total) by mouth daily with breakfast. 30 tablet 0  . Balsam Peru-Castor Oil (VENELEX) OINT Apply to sacrum and bilateral buttocks q shift and prn    . carbidopa-levodopa (SINEMET IR) 25-100 MG tablet take 1 tablet by mouth twice a day 60 tablet 11  . ciprofloxacin (CIPRO) 250 MG tablet Take 250 mg by mouth 2 (two) times daily. For 7 days until  07/26/16    . Cranberry Fruit 475 MG CAPS Take 1 capsule by mouth 2 (two) times daily.    . cyanocobalamin (,VITAMIN B-12,) 1000 MCG/ML injection Inject 1 mL (1,000 mcg total) into the muscle every 30 (thirty) days. 10 mL 3  . docusate sodium (COLACE) 100 MG capsule Take 1 capsule (100 mg total) by mouth 2 (two) times daily. 10 capsule 0  . memantine (NAMENDA) 5 MG tablet Take 1 tablet (5 mg total) by mouth 2 (two) times daily. 60 tablet 5  . mirtazapine (REMERON) 30 MG tablet Take 1 tablet (30 mg total) by mouth at bedtime. 30  tablet 11   No current facility-administered medications on file prior to visit.      Review of Systems   This is somewhat limited since patient is a somewhat poor historian.  Gen. she is not complaining of fever chills but Continues to feel fatigued.  Skin is not complaining of rashes or itching.  Eyes is not complaining of visual changes.  Here nose mouth and throat does not really complaining of any sore throat.  Cardiac does not complaining of chest pain she does have lower extremity edema.  Resp-t is not complaining of increased shortness of breath or cough.  GI is not really complaining of abdominal discomfort nausea or vomiting.  GU is being treated for a UTI but does not overtly complain of dysuria.  Muscle skeletal is complaining of some left-sided leg and hip pain at times  Neurologic is not complaining of dizziness headache or numbness.  Psych again she has had some increased confusion.    There is no immunization history on file for this patient.     Pertinent  Health Maintenance Due  Topic Date Due  . DEXA SCAN  08/09/1999  . INFLUENZA VACCINE  05/07/2017 (Originally 01/06/2016)  . PNA vac Low Risk Adult (1 of 2 - PCV13) 05/07/2017 (Originally 08/09/1999)   No flowsheet data found. Functional Status Survey:  Temperature is 98.1 pulse 91 respirations 20 blood pressure 110/61  Physical Exam    In general this is a pleasant Lelderly female in no acute distress resting  in bed she is frail appearing   Her skin is warm and dry she does have covering over her left hip surgical site I do not see any surrounding erythema or evidence of drainage through the dressing.  Eyes pupils appear reactive to light sclera injected are clear visual acuity appears grossly intact.  Oropharynx clear mucous membranes moist.  Chest is clear to auscultation there is no labored breathing.  Heart is regular rate and rhythm without murmur gallop or  rub she does have edema of both legs pedal pulses appear to be intact and palpable pedal pulses--edema somewhat greater on the left versus the right  Abdomen is soft nontender there are are positive bowel sounds.  GU could not really appreciate suprapubic distention or acute tenderness here.  Muscleskeletal does move her upper extremities has significant weakness lower extremities Continues to hold her left leg in a somewhat contracted position with significant pain with movement  Neurologic is grossly intact her speech is clear do not really appreciate lateralizing findings.  Psych she is pleasant and appropriate is able to name the president the [place] year--but has difficulty telling me the day of the week  Labs reviewed:  07/23/2016.    Troponin 0.03.  WBC 13.9 hemoglobin 10.5 platelets 503.  Sodium 138 potassium 3.5 BUN 24 creatinine 0.67.  07/22/2016.  Albumin  3.2 AST 73 alcohol phosphatase 209 otherwise liver function tests within normal limits  Recent Labs (within last 365 days)   Recent Labs  07/18/16 0640 07/20/16 1050 07/22/16 0830  NA 138 141 141  K 3.7 3.6 3.5  CL 108 103 107  CO2 26 29 25   GLUCOSE 101* 108* 99  BUN 15 23* 25*  CREATININE 0.70 0.76 0.73  CALCIUM 8.3* 9.1 8.8*      Recent Labs (within last 365 days)   Recent Labs  07/20/16 1050 07/22/16 0830  AST 67* 73*  ALT 85* 14  ALKPHOS 147* 209*  BILITOT 0.9 0.8  PROT 6.4* 6.2*  ALBUMIN 3.4* 3.2*      Recent Labs (within last 365 days)   Recent Labs  07/14/16 2210  07/18/16 0640 07/20/16 1050 07/22/16 0830  WBC 9.7  < > 9.2 13.6* 11.9*  NEUTROABS 7.1  --   --  11.2* 8.6*  HGB 12.3  < > 10.1* 10.6* 10.1*  HCT 36.3  < > 30.3* 31.3* 30.4*  MCV 97.8  < > 98.4 97.8 99.3  PLT 281  < > 243 435* 466*  < > = values in this interval not displayed.   Recent Labs       Lab Results  Component Value Date   TSH 6.192 (H) 07/20/2016     Recent Labs  No  results found for: HGBA1C   Recent Labs  No results found for: CHOL, HDL, LDLCALC, LDLDIRECT, TRIG, CHOLHDL    Significant Diagnostic Results in last 30 days:   Imaging Results  Dg Pelvis Portable  Result Date: 07/15/2016 CLINICAL DATA:  Status post LEFT hip replacement. LEFT femoral neck fracture. EXAM: PORTABLE PELVIS 1-2 VIEWS COMPARISON:  07/14/2016. FINDINGS: Patient is status post open reduction internal fixation of a LEFT femoral neck fracture with a bipolar prosthesis. Satisfactory position and alignment. Remote RIGHT THA. IMPRESSION: No adverse features. Electronically Signed   By: Elsie Stain M.D.   On: 07/15/2016 16:34   Chest Portable 1 View  Result Date: 07/15/2016 CLINICAL DATA:  Left hip fracture after a fall. EXAM: PORTABLE CHEST 1 VIEW COMPARISON:  09/19/2013 FINDINGS: Shallow inspiration. Heart size and pulmonary vascularity are normal for technique. No focal airspace disease or consolidation in the lungs. No blunting of costophrenic angles. No pneumothorax. Calcified and tortuous aorta. IMPRESSION: Shallow inspiration.  No evidence of active pulmonary disease. Electronically Signed   By: Burman Nieves M.D.   On: 07/15/2016 01:36   Dg Hip Unilat W Or Wo Pelvis 2-3 Views Left  Result Date: 07/14/2016 CLINICAL DATA:  Hip pain with injury EXAM: DG HIP (WITH OR WITHOUT PELVIS) 2-3V LEFT COMPARISON:  None. FINDINGS: The patient is status post right hip replacement without acute dislocation. Apparent old right inferior pubic ramus fracture. Acute left femoral neck fracture with close to 1/2 bone with of superior displacement of distal fracture fragment. Left femoral head projects in joint. IMPRESSION: 1. Acute displaced left femoral neck fracture. Left femoral head projects in joint. 2. Status post right hip replacement without acute dislocation. Apparent old fracture deformity of the right inferior ramus. Electronically Signed   By: Jasmine Pang M.D.   On: 07/14/2016 23:01     Dg Femur Min 2 Views Left  Result Date: 07/14/2016 CLINICAL DATA:  Hip pain EXAM: LEFT FEMUR 2 VIEWS COMPARISON:  07/14/2016 radiographs FINDINGS: The distal femur is imaged. No fracture or malalignment of the distal femur. IMPRESSION: Negative.  Please see separately dictated left hip  report Electronically Signed   By: Jasmine Pang M.D.   On: 07/14/2016 23:02    Assessment and plan.  #1 history of left hip fracture with complications as noted above with a new periprosthetic fracture-this has been evaluated by her orthopedic surgeon Dr. Harrison-recommendation is to return in the facility with pain control and there will be surgery apparently at some point next week-she does have some significant pain at times per nursing staff and family-this is complicated with her not doing well apparently with stronger medications-will attempt low-dose tramadol 25 mg every 6 hours when necessary and monitor.  #2 UTI again she is completing a course of Cipro her white count has gone up somewhat-chest x-ray was negative in the ER yesterday-will update lab work tomorrow to follow this   [phone]

## 2016-07-26 ENCOUNTER — Encounter (HOSPITAL_COMMUNITY)
Admission: RE | Admit: 2016-07-26 | Discharge: 2016-07-26 | Disposition: A | Discharge status: Home / Self Care | Payer: Medicare Other | Source: Skilled Nursing Facility | Attending: Internal Medicine | Admitting: Internal Medicine

## 2016-07-26 ENCOUNTER — Inpatient Hospital Stay (HOSPITAL_COMMUNITY)
Admission: AD | Admit: 2016-07-26 | Discharge: 2016-08-02 | DRG: 467 | Disposition: A | Discharge status: Skilled Nursing Facility | Payer: Medicare Other | Source: Ambulatory Visit | Attending: Orthopedic Surgery | Admitting: Orthopedic Surgery

## 2016-07-26 ENCOUNTER — Telehealth: Payer: Self-pay | Admitting: Orthopedic Surgery

## 2016-07-26 ENCOUNTER — Encounter (HOSPITAL_COMMUNITY): Payer: Self-pay

## 2016-07-26 DIAGNOSIS — I10 Essential (primary) hypertension: Secondary | ICD-10-CM | POA: Diagnosis present

## 2016-07-26 DIAGNOSIS — Z7982 Long term (current) use of aspirin: Secondary | ICD-10-CM | POA: Diagnosis not present

## 2016-07-26 DIAGNOSIS — M6281 Muscle weakness (generalized): Secondary | ICD-10-CM | POA: Diagnosis not present

## 2016-07-26 DIAGNOSIS — G20 Parkinson's disease: Secondary | ICD-10-CM | POA: Diagnosis not present

## 2016-07-26 DIAGNOSIS — N390 Urinary tract infection, site not specified: Secondary | ICD-10-CM | POA: Diagnosis not present

## 2016-07-26 DIAGNOSIS — Y92129 Unspecified place in nursing home as the place of occurrence of the external cause: Secondary | ICD-10-CM | POA: Diagnosis not present

## 2016-07-26 DIAGNOSIS — Z9841 Cataract extraction status, right eye: Secondary | ICD-10-CM | POA: Diagnosis not present

## 2016-07-26 DIAGNOSIS — S72002D Fracture of unspecified part of neck of left femur, subsequent encounter for closed fracture with routine healing: Secondary | ICD-10-CM | POA: Diagnosis not present

## 2016-07-26 DIAGNOSIS — F0390 Unspecified dementia without behavioral disturbance: Secondary | ICD-10-CM | POA: Diagnosis not present

## 2016-07-26 DIAGNOSIS — Z888 Allergy status to other drugs, medicaments and biological substances status: Secondary | ICD-10-CM

## 2016-07-26 DIAGNOSIS — F411 Generalized anxiety disorder: Secondary | ICD-10-CM | POA: Diagnosis not present

## 2016-07-26 DIAGNOSIS — S72113A Displaced fracture of greater trochanter of unspecified femur, initial encounter for closed fracture: Secondary | ICD-10-CM | POA: Diagnosis present

## 2016-07-26 DIAGNOSIS — Z79899 Other long term (current) drug therapy: Secondary | ICD-10-CM | POA: Diagnosis not present

## 2016-07-26 DIAGNOSIS — D62 Acute posthemorrhagic anemia: Secondary | ICD-10-CM | POA: Diagnosis not present

## 2016-07-26 DIAGNOSIS — Z9842 Cataract extraction status, left eye: Secondary | ICD-10-CM | POA: Diagnosis not present

## 2016-07-26 DIAGNOSIS — S72002G Fracture of unspecified part of neck of left femur, subsequent encounter for closed fracture with delayed healing: Secondary | ICD-10-CM

## 2016-07-26 DIAGNOSIS — Z885 Allergy status to narcotic agent status: Secondary | ICD-10-CM

## 2016-07-26 DIAGNOSIS — W19XXXA Unspecified fall, initial encounter: Secondary | ICD-10-CM | POA: Diagnosis present

## 2016-07-26 DIAGNOSIS — Z90710 Acquired absence of both cervix and uterus: Secondary | ICD-10-CM

## 2016-07-26 DIAGNOSIS — Z471 Aftercare following joint replacement surgery: Secondary | ICD-10-CM | POA: Diagnosis not present

## 2016-07-26 DIAGNOSIS — S72009A Fracture of unspecified part of neck of unspecified femur, initial encounter for closed fracture: Secondary | ICD-10-CM

## 2016-07-26 DIAGNOSIS — M9702XA Periprosthetic fracture around internal prosthetic left hip joint, initial encounter: Secondary | ICD-10-CM | POA: Diagnosis not present

## 2016-07-26 DIAGNOSIS — M9702XD Periprosthetic fracture around internal prosthetic left hip joint, subsequent encounter: Secondary | ICD-10-CM | POA: Diagnosis not present

## 2016-07-26 DIAGNOSIS — Z87440 Personal history of urinary (tract) infections: Secondary | ICD-10-CM

## 2016-07-26 DIAGNOSIS — R278 Other lack of coordination: Secondary | ICD-10-CM | POA: Diagnosis not present

## 2016-07-26 DIAGNOSIS — Z820 Family history of epilepsy and other diseases of the nervous system: Secondary | ICD-10-CM | POA: Diagnosis not present

## 2016-07-26 DIAGNOSIS — D519 Vitamin B12 deficiency anemia, unspecified: Secondary | ICD-10-CM | POA: Diagnosis not present

## 2016-07-26 DIAGNOSIS — Z9181 History of falling: Secondary | ICD-10-CM | POA: Diagnosis not present

## 2016-07-26 DIAGNOSIS — Z96649 Presence of unspecified artificial hip joint: Secondary | ICD-10-CM

## 2016-07-26 DIAGNOSIS — Z96643 Presence of artificial hip joint, bilateral: Secondary | ICD-10-CM

## 2016-07-26 DIAGNOSIS — Z96642 Presence of left artificial hip joint: Secondary | ICD-10-CM | POA: Diagnosis not present

## 2016-07-26 DIAGNOSIS — R05 Cough: Secondary | ICD-10-CM | POA: Diagnosis present

## 2016-07-26 DIAGNOSIS — S72122A Displaced fracture of lesser trochanter of left femur, initial encounter for closed fracture: Principal | ICD-10-CM | POA: Diagnosis present

## 2016-07-26 DIAGNOSIS — M978XXA Periprosthetic fracture around other internal prosthetic joint, initial encounter: Secondary | ICD-10-CM

## 2016-07-26 DIAGNOSIS — F0280 Dementia in other diseases classified elsewhere without behavioral disturbance: Secondary | ICD-10-CM | POA: Diagnosis not present

## 2016-07-26 DIAGNOSIS — R262 Difficulty in walking, not elsewhere classified: Secondary | ICD-10-CM | POA: Diagnosis not present

## 2016-07-26 LAB — COMPREHENSIVE METABOLIC PANEL
ALBUMIN: 2.7 g/dL — AB (ref 3.5–5.0)
ALK PHOS: 261 U/L — AB (ref 38–126)
ALT: 14 U/L (ref 14–54)
AST: 48 U/L — ABNORMAL HIGH (ref 15–41)
Anion gap: 7 (ref 5–15)
BILIRUBIN TOTAL: 0.6 mg/dL (ref 0.3–1.2)
BUN: 21 mg/dL — AB (ref 6–20)
CALCIUM: 8.7 mg/dL — AB (ref 8.9–10.3)
CO2: 27 mmol/L (ref 22–32)
Chloride: 106 mmol/L (ref 101–111)
Creatinine, Ser: 0.67 mg/dL (ref 0.44–1.00)
GFR calc Af Amer: >60 mL/min (ref >60)
GFR calc non Af Amer: >60 mL/min (ref >60)
GLUCOSE: 110 mg/dL — AB (ref 65–99)
Potassium: 4 mmol/L (ref 3.5–5.1)
SODIUM: 140 mmol/L (ref 135–145)
TOTAL PROTEIN: 5.5 g/dL — AB (ref 6.5–8.1)

## 2016-07-26 LAB — CBC
HEMATOCRIT: 28.4 % — AB (ref 36.0–46.0)
Hemoglobin: 9.3 g/dL — ABNORMAL LOW (ref 12.0–15.0)
MCH: 32.5 pg (ref 26.0–34.0)
MCHC: 32.7 g/dL (ref 30.0–36.0)
MCV: 99.3 fL (ref 78.0–100.0)
Platelets: 569 10*3/uL — ABNORMAL HIGH (ref 150–400)
RBC: 2.86 MIL/uL — AB (ref 3.87–5.11)
RDW: 12.9 % (ref 11.5–15.5)
WBC: 12.7 10*3/uL — AB (ref 4.0–10.5)

## 2016-07-26 LAB — SURGICAL PCR SCREEN
MRSA, PCR: NEGATIVE
Staphylococcus aureus: NEGATIVE

## 2016-07-26 MED ORDER — ONDANSETRON HCL 4 MG PO TABS: 4.0000 mg | ORAL_TABLET | Freq: Four times a day (QID) | ORAL | Status: DC | PRN

## 2016-07-26 MED ORDER — ENSURE ENLIVE PO LIQD
237.0000 mL | Freq: Two times a day (BID) | ORAL | Status: DC
  Administered 2016-07-26 – 2016-08-02 (×8): 237 mL via ORAL

## 2016-07-26 MED ORDER — KETOROLAC TROMETHAMINE 15 MG/ML IJ SOLN
15.0000 mg | Freq: Four times a day (QID) | INTRAMUSCULAR | Status: AC | PRN
  Administered 2016-07-28 – 2016-07-31 (×2): 15 mg via INTRAVENOUS
  Filled 2016-07-26 (×2): qty 1

## 2016-07-26 MED ORDER — CIPROFLOXACIN HCL 250 MG PO TABS: 250.0000 mg | ORAL_TABLET | Freq: Two times a day (BID) | ORAL | Status: DC

## 2016-07-26 MED ORDER — MIRTAZAPINE 30 MG PO TABS
30.0000 mg | ORAL_TABLET | Freq: Every day | ORAL | Status: DC
  Administered 2016-07-26 – 2016-08-01 (×6): 30 mg via ORAL
  Filled 2016-07-26 (×6): qty 1

## 2016-07-26 MED ORDER — ACETAMINOPHEN 650 MG RE SUPP: 650.0000 mg | Freq: Four times a day (QID) | RECTAL | Status: DC | PRN

## 2016-07-26 MED ORDER — ONDANSETRON HCL 4 MG/2ML IJ SOLN: 4.0000 mg | Freq: Four times a day (QID) | INTRAMUSCULAR | Status: DC | PRN

## 2016-07-26 MED ORDER — HEPARIN SODIUM (PORCINE) 5000 UNIT/ML IJ SOLN
5000.0000 [IU] | Freq: Three times a day (TID) | INTRAMUSCULAR | Status: AC
  Administered 2016-07-26 – 2016-07-27 (×3): 5000 [IU] via SUBCUTANEOUS
  Filled 2016-07-26 (×3): qty 1

## 2016-07-26 MED ORDER — CRANBERRY FRUIT 475 MG PO CAPS: 1.0000 | ORAL_CAPSULE | Freq: Two times a day (BID) | ORAL | Status: DC

## 2016-07-26 MED ORDER — ACETAMINOPHEN 325 MG PO TABS: 650.0000 mg | ORAL_TABLET | Freq: Four times a day (QID) | ORAL | Status: DC | PRN

## 2016-07-26 MED ORDER — CYANOCOBALAMIN 1000 MCG/ML IJ SOLN: 1000.0000 ug | INTRAMUSCULAR | Status: DC

## 2016-07-26 MED ORDER — MEMANTINE HCL 10 MG PO TABS
5.0000 mg | ORAL_TABLET | Freq: Two times a day (BID) | ORAL | Status: DC
  Administered 2016-07-26 – 2016-08-02 (×12): 5 mg via ORAL
  Filled 2016-07-26 (×13): qty 1

## 2016-07-26 MED ORDER — CARBIDOPA-LEVODOPA 25-100 MG PO TABS
1.0000 | ORAL_TABLET | Freq: Two times a day (BID) | ORAL | Status: DC
  Administered 2016-07-26 – 2016-08-02 (×12): 1 via ORAL
  Filled 2016-07-26 (×13): qty 1

## 2016-07-26 MED ORDER — DOCUSATE SODIUM 100 MG PO CAPS
100.0000 mg | ORAL_CAPSULE | Freq: Two times a day (BID) | ORAL | Status: DC
  Administered 2016-07-26 – 2016-08-02 (×12): 100 mg via ORAL
  Filled 2016-07-26 (×13): qty 1

## 2016-07-26 MED ORDER — POTASSIUM CHLORIDE IN NACL 20-0.9 MEQ/L-% IV SOLN
INTRAVENOUS | Status: DC
Start: 2016-07-26 — End: 2016-07-28
  Administered 2016-07-26: 13:00:00 via INTRAVENOUS

## 2016-07-26 MED ORDER — TRAMADOL HCL 50 MG PO TABS
50.0000 mg | ORAL_TABLET | Freq: Four times a day (QID) | ORAL | Status: DC | PRN
  Administered 2016-07-26 – 2016-08-02 (×5): 50 mg via ORAL
  Filled 2016-07-26 (×5): qty 1

## 2016-07-26 NOTE — Telephone Encounter (Signed)
Pt's daughter, Marcelle Smilingnna Jane called and wanted to know what day and what time her mother's surgery is scheduled for.  She asks that our nurse call her back and let her know.  Her phone number is [phone]  Thanks

## 2016-07-26 NOTE — H&P (Signed)
[name] Oliver is an 83 y.o. female.   Chief Complaint: Left hip fracture left hip pain HPI: 83 year old female had a femoral neck fracture treated with a bipolar uncemented prosthesis. She went to the nursing home. She developed a UTI. She fell and fractured her lesser trochanter. She also developed a UTI which she is currently being treated for.  She has no idea where she is secondary to her confusion  Past Medical History:  Diagnosis Date  . Chronic cough   . Dementia   . Hip fracture (HCC)   . Memory difficulty 01/08/2014  . Pelvis fracture (HCC)   . Primary Parkinsonism [facility](HCC)     Past Surgical History:  Procedure Laterality Date  . ABDOMINAL HYSTERECTOMY    . ABDOMINAL HYSTERECTOMY    . BREAST LUMPECTOMY Right   . CATARACT EXTRACTION Bilateral    03/14/13  . FRACTURE SURGERY    . HIP ARTHROPLASTY Left 07/15/2016   Procedure: BIPOLAR REPLACEMENT LEFT HIP;  Surgeon: Vickki HearingStanley E Nemesio Castrillon, MD;  Location: [facility];  Service: Orthopedics;  Laterality: Left;  . JOINT REPLACEMENT    . TOTAL HIP ARTHROPLASTY Right     Family History  Problem Relation Age of Onset  . Dementia Mother   . Bipolar disorder Sister   . Parkinsonism Sister   . Diabetes Other    Social History:  reports that she has never smoked. She has never used smokeless tobacco. She reports that she does not drink alcohol or use drugs.  Allergies:  Allergies  Allergen Reactions  . Morphine And Related Other (See Comments)    Hallucinations  . Prednisone Other (See Comments)    manic  . Namzaric [Memantine Hcl-Donepezil Hcl] Anxiety    Medications Prior to Admission  Medication Sig Dispense Refill  . acetaminophen (TYLENOL) 500 MG tablet Take 1 tablet (500 mg total) by mouth every 6 (six) hours. 30 tablet 0  . aspirin EC 325 MG EC tablet Take 1 tablet (325 mg total) by mouth daily with breakfast. 30 tablet 0  . Balsam [place]-Castor Oil (VENELEX) OINT Apply 1 application topically See admin instructions. Apply to sacrum  and bilateral buttocks q shift and prn     . carbidopa-levodopa (SINEMET IR) 25-100 MG tablet take 1 tablet by mouth twice a day 60 tablet 11  . ciprofloxacin (CIPRO) 250 MG tablet Take 250 mg by mouth 2 (two) times daily. For 7 days until 07/26/16    . Cranberry 450 MG CAPS Take 1 capsule by mouth 2 (two) times daily.    . cyanocobalamin (,VITAMIN B-12,) 1000 MCG/ML injection Inject 1 mL (1,000 mcg total) into the muscle every 30 (thirty) days. 10 mL 3  . docusate sodium (COLACE) 100 MG capsule Take 1 capsule (100 mg total) by mouth 2 (two) times daily. 10 capsule 0  . memantine (NAMENDA) 5 MG tablet Take 1 tablet (5 mg total) by mouth 2 (two) times daily. 60 tablet 5  . mirtazapine (REMERON) 30 MG tablet Take 1 tablet (30 mg total) by mouth at bedtime. 30 tablet 11  . traMADol (ULTRAM) 50 MG tablet Take 25 mg by mouth every 6 (six) hours as needed for moderate pain.      Results for orders placed or performed during the hospital encounter of 07/26/16 (from the past 48 hour(s))  Surgical PCR screen     Status: None   Collection Time: 07/26/16 12:51 PM  Result Value Ref Range   MRSA, PCR NEGATIVE NEGATIVE   Staphylococcus aureus NEGATIVE  NEGATIVE    Comment:        The Xpert SA Assay (FDA approved for NASAL specimens in patients over 21 years of age), is one component of a comprehensive surveillance program.  Test performance has been validated by [facility] for patients greater than or equal to 45 year old. It is not intended to diagnose infection nor to guide or monitor treatment.    No results found.  ROS  Blood pressure (!) 143/55, pulse 95, temperature 98.2 F (36.8 C), resp. rate 16, height 5\' 2"  (1.575 m), SpO2 97 %. Physical Exam  Constitutional: She appears well-nourished.  Eyes: Right eye exhibits no discharge. Left eye exhibits no discharge. No scleral icterus.  Neck: Neck supple. No JVD present. No tracheal deviation present.  Cardiovascular: Intact distal pulses.    Respiratory: Effort normal. No stridor.  GI: Soft. She exhibits no distension.  Neurological: She exhibits normal muscle tone. Coordination normal.  Skin: Skin is warm and dry. No rash noted. No erythema. No pallor.  Psychiatric: Her speech is normal and behavior is normal. Her affect is labile and inappropriate. Her affect is not angry. Thought content is not paranoid. Cognition and memory are impaired. She exhibits abnormal recent memory and abnormal remote memory.  Cannot assess judgment  Patient is confused    The patient's upper extremities show no contracture subluxation atrophy or tremor that is any significant she has some mild degenerative changes in some of the joints of the hand  Right lower extremity normal range of motion stability strength and alignment  Left lower extremity is externally rotated with tenderness medial side of left proximal thigh. Seen incision with staples still in place looks normal. The thigh is swollen but not firm  Distal neurovascular function is intact. There is no evidence of subluxation atrophy tremor or contracture.   Assessment/Plan  She has a urinary tract infection she is on Cipro   Patient will need a preop blood transfusion even though her hemoglobin is 9.3    Open treatment internal fixation lesser trochanteric fracture  left hip with preparation for long stem and cemented prosthesis if needed  Fuller Canada, MD 07/26/2016, 5:54 PM

## 2016-07-26 NOTE — Progress Notes (Signed)
PHARMACIST - PHYSICIAN ORDER COMMUNICATION  CONCERNING: P&T Medication Policy on Herbal Medications  DESCRIPTION:  This patient's order for:  Cranberry Fruit Caps  has been noted.  This product(s) is classified as an "herbal" or natural product. Due to a lack of definitive safety studies or FDA approval, nonstandard manufacturing practices, plus the potential risk of unknown drug-drug interactions while on inpatient medications, the Pharmacy and Therapeutics Committee does not permit the use of "herbal" or natural products of this type within [facility].   ACTION TAKEN: The pharmacy department is unable to verify this order at this time and your patient has been informed of this safety policy. Please reevaluate patient's clinical condition at discharge and address if the herbal or natural product(s) should be resumed at that time.

## 2016-07-26 NOTE — Telephone Encounter (Signed)
Daughter aware surgery wednesday

## 2016-07-27 ENCOUNTER — Inpatient Hospital Stay (HOSPITAL_COMMUNITY): Payer: Medicare Other

## 2016-07-27 ENCOUNTER — Other Ambulatory Visit (HOSPITAL_COMMUNITY): Payer: Medicare Other

## 2016-07-27 LAB — HEMOGLOBIN AND HEMATOCRIT, BLOOD
HEMATOCRIT: 32.9 % — AB (ref 36.0–46.0)
Hemoglobin: 11 g/dL — ABNORMAL LOW (ref 12.0–15.0)

## 2016-07-27 LAB — URINALYSIS, ROUTINE W REFLEX MICROSCOPIC
BILIRUBIN URINE: NEGATIVE
GLUCOSE, UA: NEGATIVE mg/dL
HGB URINE DIPSTICK: NEGATIVE
KETONES UR: 5 mg/dL — AB
LEUKOCYTES UA: NEGATIVE
Nitrite: NEGATIVE
PROTEIN: NEGATIVE mg/dL
Specific Gravity, Urine: 1.018 (ref 1.005–1.030)
pH: 6 (ref 5.0–8.0)

## 2016-07-27 LAB — PREPARE RBC (CROSSMATCH)

## 2016-07-27 MED ORDER — CHLORHEXIDINE GLUCONATE 4 % EX LIQD
60.0000 mL | Freq: Once | CUTANEOUS | Status: AC
  Administered 2016-07-27: 4 via TOPICAL
  Filled 2016-07-27: qty 15

## 2016-07-27 MED ORDER — CEFAZOLIN SODIUM-DEXTROSE 2-4 GM/100ML-% IV SOLN
2.0000 g | INTRAVENOUS | Status: AC
  Administered 2016-07-28: 2 g via INTRAVENOUS
  Filled 2016-07-27 (×2): qty 100

## 2016-07-27 MED ORDER — SODIUM CHLORIDE 0.9 % IV SOLN
Freq: Once | INTRAVENOUS | Status: AC
  Administered 2016-07-27: 09:00:00 via INTRAVENOUS

## 2016-07-27 NOTE — Clinical Social Work Note (Signed)
Clinical Social Work Assessment  Patient Details  Name: [name] Oliver MRN: [id number] Date of Birth: May 23, 1933  Date of referral:  07/27/16               Reason for consult:  Discharge Planning                Permission sought to share information with:  Family Supports, Customer service manager Permission granted to share information::  Yes, Verbal Permission Granted  Name::     Public librarian::  [facility]  Relationship::  daughter, Designer, industrial/product Information:     Housing/Transportation Living arrangements for the past 2 months:  [place] of Information:  Facility, Adult Children Patient Interpreter Needed:  None Criminal Activity/Legal Involvement Pertinent to Current Situation/Hospitalization:  No - Comment as needed Significant Relationships:  Adult Children, Spouse Lives with:  Facility Resident Do you feel safe going back to the place where you live?  Yes Need for family participation in patient care:  Yes (Comment)  Care giving concerns:  Pt has been resident at [facility]. She fell at facility and fractured left hip again.     Social Worker assessment / plan:  CSW met with pt's daughter, [name] Oliver at bedside. Pt went to [facility] last week after left hip fracture. She fell at [facility] and is admitted again with left hip fracture. Surgery planned for tomorrow.  [name] Oliver is very concerned that pt will fall again and states she spoke with MD about a Air cabin crew. Discussed with unit director and notified family that if sitter is not available, there is option to use telesitter or family can stay with pt. She is aware that once pt returns to SNF, they would need to consider private duty care if needed. She requests return to [facility] when medically stable. Per Marianna Fuss at facility, pt is okay to return and no FL2 needed.   Employment status:  Retired Forensic scientist:  Medicare PT Recommendations:  [place] / Referral to community resources:  Other (Comment  Required) (Return to [facility])  Patient/Family's Response to care:  Pt's daughter requests return to [facility] when medically stable.   Patient/Family's Understanding of and Emotional Response to Diagnosis, Current Treatment, and Prognosis:  Pt's daughter aware of admission diagnosis and plan for surgery tomorrow. She is very concerned about pt falling again due to confusion.   Emotional Assessment Appearance:  Appears stated age Attitude/Demeanor/Rapport:  Unable to Assess Affect (typically observed):  Unable to Assess Orientation:  Oriented to Self Alcohol / Substance use:  Not Applicable Psych involvement (Current and /or in the community):  No (Comment)  Discharge Needs  Concerns to be addressed:  Discharge Planning Concerns Readmission within the last 30 days:  Yes Current discharge risk:  Physical Impairment Barriers to Discharge:  Continued Medical Work up   Salome Arnt, Clark 07/27/2016, 10:25 AM [phone]

## 2016-07-27 NOTE — Progress Notes (Signed)
Initial Nutrition Assessment  DOCUMENTATION CODES:   Not applicable  INTERVENTION:  - Continue Ensure Enlive BID, each supplement provides 350 calories and 20 grams protein  NUTRITION DIAGNOSIS:   Inadequate oral intake related to poor appetite as evidenced by per patient/family report.  GOAL:   Patient will meet greater than or equal to 90% of their needs  MONITOR:   PO intake, Supplement acceptance, Weight trends, I & O's  REASON FOR ASSESSMENT:   Malnutrition Screening Tool    ASSESSMENT:   83 y.o. female had a femoral neck fracture treated with a bipolar uncemented prosthesis. She went to the nursing home. She fell and fractured her lesser trochanter. She also developed a UTI which she is currently being treated for.  Pt seemed slightly confused at time of visit and no family at bedside, could not collect a full history.  Pt states she is unaware of any weight loss. Per chart review pt's weight has been fairly stable for the last year around the 120 lb range. Pt's current weight is 123 lb.  Pt reports a decreased appetite. Per meal consumption percentage records, pt has completed 0-50% of meals this admission. Pt's breakfast and lunch trays were still at bedside and less than 50% completed. Pt did express enjoying the magic cup as well as the Ensure nutritional supplements.   Per nutrition focused physical exam pt showed mild-moderate muscle wasting, mild-moderate fat wasting, and no edema. Suspect this is related to age-related sarcopenia rather than malnutrition due to stable weight status.  Labs reviewed; Serum glucose (99-123)  Medications reviewed; 30 mg Remeron, 1000 mcg Vitamin B-12, 100 mg Colace  Diet Order:  Diet regular Room service appropriate? Yes; Fluid consistency: Thin  Skin:  Reviewed, no issues  Last BM:  No BM Date Recorded  Height:   Ht Readings from Last 1 Encounters:  07/27/16 5' 2.01" (1.575 m)    Weight:   Wt Readings from Last 1  Encounters:  07/27/16 123 lb (55.8 kg)    Ideal Body Weight:  50 kg  BMI:  Body mass index is 22.49 kg/m.  Estimated Nutritional Needs:   Kcal:  1400-1600 (25-29 kcal/kg)  Protein:  70-80 grams (1.25-1.4 g/kg)  Fluid:  >/= 1.5 L/d  EDUCATION NEEDS:   Education needs no appropriate at this time   [employer] Ioannides Dietetic Intern

## 2016-07-27 NOTE — Progress Notes (Signed)
Pt has tele sitter in place. CN, RN, and NT notified.

## 2016-07-27 NOTE — Progress Notes (Addendum)
Patient ID: [name] Oliver, female   DOB: 11/28/1932, 83 y.o.   MRN: [id number] HD 2 periprosthetic fracture left hip lesser troches fracture  Patient has chronic UTI  Treated for UTI with Cipro 250 mg twice a day 7 days stop date was 219  The patient is preop for open treatment internal fixation of said fracture  CBC Latest Ref Rng & Units 07/26/2016 07/25/2016 07/23/2016  WBC 4.0 - 10.5 K/uL 12.7(H) 13.4(H) 13.9(H)  Hemoglobin 12.0 - 15.0 g/dL 1.9(J9.3(L) 4.7(W9.6(L) 10.5(L)  Hematocrit 36.0 - 46.0 % 28.4(L) 28.9(L) 31.4(L)  Platelets 150 - 400 K/uL 569(H) 540(H) 503(H)   BMP Latest Ref Rng & Units 07/26/2016 07/25/2016 07/23/2016  Glucose 65 - 99 mg/dL 295(A110(H) 213(Y123(H) 865(H119(H)  BUN 6 - 20 mg/dL 84(O21(H) 96(E21(H) 95(M24(H)  Creatinine 0.44 - 1.00 mg/dL 8.410.67 3.240.82 4.010.67  Sodium 135 - 145 mmol/L 140 141 138  Potassium 3.5 - 5.1 mmol/L 4.0 4.1 3.5  Chloride 101 - 111 mmol/L 106 108 104  CO2 22 - 32 mmol/L 27 27 27   Calcium 8.9 - 10.3 mg/dL 0.2(V8.7(L) 2.5(D8.8(L) 9.0   EXAM: PORTABLE CHEST 1 VIEW   COMPARISON:  07/15/2006   FINDINGS: The heart size and mediastinal contours are within normal limits. Mild chronic pulmonary interstitial prominence. There is no evidence of pulmonary edema, consolidation, pneumothorax, nodule or pleural fluid. The visualized skeletal structures are unremarkable.   IMPRESSION: No active disease.     Electronically Signed   By: Irish LackGlenn  Yamagata M.D.   On: 07/23/2016 15:08   UA C/S TODAY   TRANSFUSE 1 U PRBCS  TODAY TO PREPARE FOR BLOOD LOSS EXPECTED AT SURGERY

## 2016-07-27 NOTE — Progress Notes (Signed)
Pt transported to Xray via transfer staff.

## 2016-07-28 ENCOUNTER — Inpatient Hospital Stay (HOSPITAL_COMMUNITY): Admission: RE | Admit: 2016-07-28 | Payer: Medicare Other | Source: Ambulatory Visit | Admitting: Orthopedic Surgery

## 2016-07-28 ENCOUNTER — Inpatient Hospital Stay (HOSPITAL_COMMUNITY): Payer: Medicare Other | Admitting: Anesthesiology

## 2016-07-28 ENCOUNTER — Encounter (HOSPITAL_COMMUNITY): Payer: Self-pay | Admitting: *Deleted

## 2016-07-28 ENCOUNTER — Inpatient Hospital Stay (HOSPITAL_COMMUNITY): Payer: Medicare Other

## 2016-07-28 ENCOUNTER — Encounter (HOSPITAL_COMMUNITY)
Admission: AD | Disposition: A | Discharge status: Skilled Nursing Facility | Payer: Self-pay | Source: Ambulatory Visit | Attending: Orthopedic Surgery

## 2016-07-28 HISTORY — PX: ORIF HIP FRACTURE: SHX2125

## 2016-07-28 LAB — POCT HEMOGLOBIN-HEMACUE: Hemoglobin: 11.1 g/dL — ABNORMAL LOW (ref 12.0–15.0)

## 2016-07-28 LAB — PREPARE RBC (CROSSMATCH)

## 2016-07-28 SURGERY — OPEN REDUCTION INTERNAL FIXATION HIP
Anesthesia: General | Site: Hip | Laterality: Left

## 2016-07-28 MED ORDER — BUPIVACAINE IN DEXTROSE 0.75-8.25 % IT SOLN
INTRATHECAL | Status: DC | PRN
  Administered 2016-07-28: 13 mg via INTRATHECAL

## 2016-07-28 MED ORDER — SODIUM CHLORIDE 0.9 % IR SOLN
Status: DC | PRN
  Administered 2016-07-28: 2000 mL

## 2016-07-28 MED ORDER — MIDAZOLAM HCL 2 MG/2ML IJ SOLN
1.0000 mg | INTRAMUSCULAR | Status: AC
  Administered 2016-07-28 (×2): 1 mg via INTRAVENOUS

## 2016-07-28 MED ORDER — PROPOFOL 500 MG/50ML IV EMUL
INTRAVENOUS | Status: DC | PRN
  Administered 2016-07-28: 50 ug/kg/min via INTRAVENOUS

## 2016-07-28 MED ORDER — FENTANYL CITRATE (PF) 100 MCG/2ML IJ SOLN: 25.0000 ug | INTRAMUSCULAR | Status: DC | PRN

## 2016-07-28 MED ORDER — MIDAZOLAM HCL 5 MG/5ML IJ SOLN
INTRAMUSCULAR | Status: DC | PRN
  Administered 2016-07-28 (×2): 1 mg via INTRAVENOUS

## 2016-07-28 MED ORDER — METHOCARBAMOL 500 MG PO TABS: 500.0000 mg | ORAL_TABLET | Freq: Four times a day (QID) | ORAL | Status: DC | PRN

## 2016-07-28 MED ORDER — BUPIVACAINE-EPINEPHRINE (PF) 0.5% -1:200000 IJ SOLN
INTRAMUSCULAR | Status: AC
  Filled 2016-07-28: qty 30

## 2016-07-28 MED ORDER — BUPIVACAINE-EPINEPHRINE (PF) 0.5% -1:200000 IJ SOLN
INTRAMUSCULAR | Status: DC | PRN
  Administered 2016-07-28: 30 mL via PERINEURAL

## 2016-07-28 MED ORDER — SENNA 8.6 MG PO TABS
1.0000 | ORAL_TABLET | Freq: Two times a day (BID) | ORAL | Status: DC
  Administered 2016-07-29 – 2016-08-02 (×9): 8.6 mg via ORAL
  Filled 2016-07-28 (×9): qty 1

## 2016-07-28 MED ORDER — PHENYLEPHRINE HCL 10 MG/ML IJ SOLN
INTRAMUSCULAR | Status: DC | PRN
  Administered 2016-07-28: 80 ug via INTRAVENOUS
  Administered 2016-07-28: 40 ug via INTRAVENOUS
  Administered 2016-07-28 (×3): 80 ug via INTRAVENOUS

## 2016-07-28 MED ORDER — PROPOFOL 10 MG/ML IV BOLUS
INTRAVENOUS | Status: DC | PRN
  Administered 2016-07-28: 30 mg via INTRAVENOUS

## 2016-07-28 MED ORDER — BISACODYL 5 MG PO TBEC: 5.0000 mg | DELAYED_RELEASE_TABLET | Freq: Every day | ORAL | Status: DC | PRN

## 2016-07-28 MED ORDER — MIDAZOLAM HCL 2 MG/2ML IJ SOLN
INTRAMUSCULAR | Status: AC
  Filled 2016-07-28: qty 2

## 2016-07-28 MED ORDER — EPHEDRINE SULFATE 50 MG/ML IJ SOLN
INTRAMUSCULAR | Status: DC | PRN
  Administered 2016-07-28 (×5): 5 mg via INTRAVENOUS

## 2016-07-28 MED ORDER — METOCLOPRAMIDE HCL 5 MG/ML IJ SOLN: 5.0000 mg | Freq: Three times a day (TID) | INTRAMUSCULAR | Status: DC | PRN

## 2016-07-28 MED ORDER — SODIUM CHLORIDE 0.9 % IV SOLN
INTRAVENOUS | Status: DC
  Administered 2016-07-28 – 2016-07-31 (×4): via INTRAVENOUS

## 2016-07-28 MED ORDER — FENTANYL CITRATE (PF) 100 MCG/2ML IJ SOLN
INTRAMUSCULAR | Status: DC | PRN
  Administered 2016-07-28: 25 ug via INTRAVENOUS
  Administered 2016-07-28: 25 ug via INTRATHECAL
  Administered 2016-07-28: 25 ug via INTRAVENOUS
  Administered 2016-07-28: 50 ug via INTRAVENOUS
  Administered 2016-07-28 (×2): 25 ug via INTRAVENOUS
  Administered 2016-07-28: 50 ug via INTRAVENOUS
  Administered 2016-07-28: 25 ug via INTRAVENOUS
  Administered 2016-07-28: 50 ug via INTRAVENOUS

## 2016-07-28 MED ORDER — LACTATED RINGERS IV SOLN
INTRAVENOUS | Status: DC | PRN
  Administered 2016-07-28: 20 mL
  Administered 2016-07-28: 13:00:00 via INTRAVENOUS

## 2016-07-28 MED ORDER — HYDROMORPHONE HCL 1 MG/ML IJ SOLN
0.5000 mg | Freq: Once | INTRAMUSCULAR | Status: AC
  Administered 2016-07-28: 0.5 mg via INTRAVENOUS
  Filled 2016-07-28: qty 0.5

## 2016-07-28 MED ORDER — METHOCARBAMOL 1000 MG/10ML IJ SOLN
500.0000 mg | Freq: Four times a day (QID) | INTRAVENOUS | Status: DC | PRN
  Filled 2016-07-28: qty 5

## 2016-07-28 MED ORDER — ETOMIDATE 2 MG/ML IV SOLN
INTRAVENOUS | Status: DC | PRN
  Administered 2016-07-28: 10 mg via INTRAVENOUS

## 2016-07-28 MED ORDER — ASPIRIN EC 325 MG PO TBEC
325.0000 mg | DELAYED_RELEASE_TABLET | Freq: Every day | ORAL | Status: DC
  Administered 2016-07-29 – 2016-08-02 (×5): 325 mg via ORAL
  Filled 2016-07-28 (×5): qty 1

## 2016-07-28 MED ORDER — CEFAZOLIN SODIUM-DEXTROSE 2-4 GM/100ML-% IV SOLN
INTRAVENOUS | Status: AC
  Filled 2016-07-28: qty 100

## 2016-07-28 MED ORDER — ALUM & MAG HYDROXIDE-SIMETH 200-200-20 MG/5ML PO SUSP: 30.0000 mL | ORAL | Status: DC | PRN

## 2016-07-28 MED ORDER — PHENOL 1.4 % MT LIQD: 1.0000 | OROMUCOSAL | Status: DC | PRN

## 2016-07-28 MED ORDER — SODIUM CHLORIDE 0.9 % IV SOLN
INTRAVENOUS | Status: DC | PRN
  Administered 2016-07-28: 15:00:00 via INTRAVENOUS

## 2016-07-28 MED ORDER — SUCCINYLCHOLINE CHLORIDE 20 MG/ML IJ SOLN
INTRAMUSCULAR | Status: DC | PRN
  Administered 2016-07-28: 100 mg via INTRAVENOUS

## 2016-07-28 MED ORDER — MENTHOL 3 MG MT LOZG: 1.0000 | LOZENGE | OROMUCOSAL | Status: DC | PRN

## 2016-07-28 MED ORDER — ACETAMINOPHEN 10 MG/ML IV SOLN
INTRAVENOUS | Status: AC
  Filled 2016-07-28: qty 100

## 2016-07-28 MED ORDER — CEFAZOLIN SODIUM-DEXTROSE 2-4 GM/100ML-% IV SOLN
2.0000 g | Freq: Four times a day (QID) | INTRAVENOUS | Status: AC
  Administered 2016-07-28 – 2016-07-29 (×2): 2 g via INTRAVENOUS
  Filled 2016-07-28 (×2): qty 100

## 2016-07-28 MED ORDER — METOCLOPRAMIDE HCL 10 MG PO TABS: 5.0000 mg | ORAL_TABLET | Freq: Three times a day (TID) | ORAL | Status: DC | PRN

## 2016-07-28 MED ORDER — LACTATED RINGERS IV SOLN
INTRAVENOUS | Status: DC
  Administered 2016-07-28: 11:00:00 via INTRAVENOUS

## 2016-07-28 MED ORDER — ACETAMINOPHEN 10 MG/ML IV SOLN
1000.0000 mg | Freq: Four times a day (QID) | INTRAVENOUS | Status: AC
  Administered 2016-07-28 – 2016-07-29 (×3): 1000 mg via INTRAVENOUS
  Filled 2016-07-28 (×4): qty 100

## 2016-07-28 SURGICAL SUPPLY — 65 items
BAG HAMPER (MISCELLANEOUS) ×3 IMPLANT
BIT DRILL 2.8X128 (BIT) ×2 IMPLANT
BIT DRILL 2.8X128MM (BIT) ×1
BIT DRILL AO GAMMA 4.2X300 (BIT) ×3 IMPLANT
BLADE 10 SAFETY STRL DISP (BLADE) ×6 IMPLANT
BLADE SAGITTAL 25.0X1.27X90 (BLADE) ×2 IMPLANT
BLADE SAGITTAL 25.0X1.27X90MM (BLADE) ×1
BNDG GAUZE ELAST 4 BULKY (GAUZE/BANDAGES/DRESSINGS) IMPLANT
CHLORAPREP W/TINT 26ML (MISCELLANEOUS) IMPLANT
CLOTH BEACON ORANGE TIMEOUT ST (SAFETY) ×3 IMPLANT
COVER LIGHT HANDLE STERIS (MISCELLANEOUS) ×6 IMPLANT
COVER MAYO STAND XLG (DRAPE) ×6 IMPLANT
DECANTER SPIKE VIAL GLASS SM (MISCELLANEOUS) ×3 IMPLANT
DRAPE STERI IOBAN 125X83 (DRAPES) ×3 IMPLANT
DRAPE X-RAY CASS 24X20 (DRAPES) ×3 IMPLANT
DRSG MEPILEX BORDER 4X12 (GAUZE/BANDAGES/DRESSINGS) ×3 IMPLANT
ELECT CAUTERY BLADE 6.4 (BLADE) ×3 IMPLANT
ELECT REM PT RETURN 9FT ADLT (ELECTROSURGICAL) ×6
ELECTRODE REM PT RTRN 9FT ADLT (ELECTROSURGICAL) ×2 IMPLANT
GLOVE BIO SURGEON STRL SZ7 (GLOVE) ×3 IMPLANT
GLOVE BIOGEL PI IND STRL 7.0 (GLOVE) ×1 IMPLANT
GLOVE BIOGEL PI INDICATOR 7.0 (GLOVE) ×2
GLOVE SKINSENSE NS SZ8.0 LF (GLOVE) ×12
GLOVE SKINSENSE STRL SZ8.0 LF (GLOVE) ×6 IMPLANT
GLOVE SS N UNI LF 8.5 STRL (GLOVE) ×12 IMPLANT
GOWN STRL REUS W/TWL LRG LVL3 (GOWN DISPOSABLE) ×9 IMPLANT
GOWN STRL REUS W/TWL XL LVL3 (GOWN DISPOSABLE) ×3 IMPLANT
GUIDEROD T2 3X1000 (ROD) IMPLANT
HEAD FEM UNIPOLAR 45 OD STRL (Hips) ×3 IMPLANT
INST SET MAJOR BONE (KITS) ×3 IMPLANT
K-WIRE  3.2X450M STR (WIRE)
K-WIRE 3.2X450M STR (WIRE)
KIT BLADEGUARD II DBL (SET/KITS/TRAYS/PACK) IMPLANT
KIT REMOVER STAPLE SKIN (MISCELLANEOUS) ×3 IMPLANT
KIT ROOM TURNOVER AP CYSTO (KITS) ×3 IMPLANT
KWIRE 3.2X450M STR (WIRE) IMPLANT
MANIFOLD NEPTUNE II (INSTRUMENTS) ×3 IMPLANT
MARKER SKIN DUAL TIP RULER LAB (MISCELLANEOUS) ×3 IMPLANT
NEEDLE HYPO 21X1.5 SAFETY (NEEDLE) ×3 IMPLANT
NEEDLE SPNL 18GX3.5 QUINCKE PK (NEEDLE) ×3 IMPLANT
NS IRRIG 1000ML POUR BTL (IV SOLUTION) ×6 IMPLANT
PACK BASIC III (CUSTOM PROCEDURE TRAY) ×4
PACK SRG BSC III STRL LF ECLPS (CUSTOM PROCEDURE TRAY) ×2 IMPLANT
PASSER SUT SWANSON 36MM LOOP (INSTRUMENTS) ×3 IMPLANT
PENCIL HANDSWITCHING (ELECTRODE) ×3 IMPLANT
PILLOW HIP ABDUCTION MED (ORTHOPEDIC SUPPLIES) ×3 IMPLANT
SET BASIN LINEN APH (SET/KITS/TRAYS/PACK) ×3 IMPLANT
SLEEVE BEADED CABLE (Orthopedic Implant) ×9 IMPLANT
SPACER DEPUY (Hips) ×3 IMPLANT
SPONGE LAP 18X18 X RAY DECT (DISPOSABLE) ×15 IMPLANT
STAPLER VISISTAT 35W (STAPLE) ×3 IMPLANT
SUT BRALON NAB BRD #1 30IN (SUTURE) ×6 IMPLANT
SUT ETHIBOND 5 LR DA (SUTURE) ×12 IMPLANT
SUT MNCRL 0 VIOLET CTX 36 (SUTURE) ×2 IMPLANT
SUT MON AB 0 CT1 (SUTURE) ×3 IMPLANT
SUT MON AB 2-0 CT1 36 (SUTURE) IMPLANT
SUT MONOCRYL 0 CTX 36 (SUTURE) ×4
SUT VIC AB 1 CT1 27 (SUTURE) ×4
SUT VIC AB 1 CT1 27XBRD ANTBC (SUTURE) ×2 IMPLANT
SYR 30ML LL (SYRINGE) IMPLANT
SYR BULB IRRIGATION 50ML (SYRINGE) ×3 IMPLANT
SYS SOLUTION 13.5 LG EXP CAGE (Hips) ×3 IMPLANT
SYSTEM SOLUTN 13.5 LG EXP CAGE (Hips) ×1 IMPLANT
WATER STERILE IRR 1000ML POUR (IV SOLUTION) ×6 IMPLANT
YANKAUER SUCT 12FT TUBE ARGYLE (SUCTIONS) ×3 IMPLANT

## 2016-07-28 NOTE — Anesthesia Postprocedure Evaluation (Signed)
Anesthesia Post Note  Patient: [name] Oliver  Procedure(s) Performed: Procedure(s) (LRB): Revision of bipolar hip replacement into unipolar hip replacement (Left)  Patient location during evaluation: PACU Anesthesia Type: General Level of consciousness: awake and alert and confused (Oriented to person only; patient confused preoperatively) Pain management: pain level controlled Vital Signs Assessment: post-procedure vital signs reviewed and stable Respiratory status: spontaneous breathing Cardiovascular status: stable and tachycardic Postop Assessment: no signs of nausea or vomiting Anesthetic complications: no     Last Vitals:  Vitals:   07/28/16 1105 07/28/16 1700  BP: 124/62 (!) 160/85  Pulse:  (!) 105  Resp: (!) 23 (!) 22  Temp:  36.7 C    Last Pain:  Vitals:   07/28/16 1040  TempSrc: Oral  PainSc:                  ADAMS, AMY A

## 2016-07-28 NOTE — Progress Notes (Signed)
Received verbal order from Dr. Romeo AppleHArrison for IV Dilaudid 0.5 mg one time dose.

## 2016-07-28 NOTE — Anesthesia Procedure Notes (Signed)
Procedure Name: Intubation Date/Time: 07/28/2016 1:33 PM Performed by: Pernell DupreADAMS, Willye Javier A Pre-anesthesia Checklist: Patient identified, Patient being monitored, Timeout performed, Emergency Drugs available and Suction available Patient Re-evaluated:Patient Re-evaluated prior to inductionOxygen Delivery Method: Circle System Utilized Preoxygenation: Pre-oxygenation with 100% oxygen Intubation Type: IV induction Ventilation: Mask ventilation without difficulty Laryngoscope Size: Glidescope and 3 Grade View: Grade I Tube type: Oral Tube size: 7.0 mm Number of attempts: 1 Airway Equipment and Method: Stylet Placement Confirmation: ETT inserted through vocal cords under direct vision,  positive ETCO2 and breath sounds checked- equal and bilateral Secured at: 21 cm Tube secured with: Tape Dental Injury: Teeth and Oropharynx as per pre-operative assessment

## 2016-07-28 NOTE — Anesthesia Procedure Notes (Addendum)
Spinal  Patient location during procedure: OR Start time: 07/28/2016 12:55 PM Staffing Anesthesiologist: Laurene FootmanGONZALEZ, LUIS Resident/CRNA: Jabaree Mercado A Preanesthetic Checklist Completed: patient identified, site marked, surgical consent, pre-op evaluation, timeout performed, IV checked, risks and benefits discussed and monitors and equipment checked Spinal Block Patient position: left lateral decubitus Prep: Betadine Patient monitoring: heart rate, cardiac monitor, continuous pulse ox and blood pressure Approach: left paramedian Location: L3-4 Injection technique: single-shot Needle Needle type: Spinocan  Needle gauge: 22 G Needle length: 9 cm Assessment Sensory level: T8 Additional Notes  ATTEMPTS:2 TRAY ZO:[id number] TRAY EXPIRATION DATE:06/06/2017 Attempt x1 By Avik Leoni,CRNA; Block placed by Dr. Jayme CloudGonzalez x1 attempt; Patient tolerated well.

## 2016-07-28 NOTE — Anesthesia Procedure Notes (Signed)
Procedure Name: MAC Date/Time: 07/28/2016 12:28 PM Performed by: Andree Elk, Oluwadamilola Rosamond A Pre-anesthesia Checklist: Patient identified, Timeout performed, Emergency Drugs available, Suction available and Patient being monitored Oxygen Delivery Method: Simple face mask

## 2016-07-28 NOTE — Brief Op Note (Addendum)
07/26/2016 - 07/28/2016  5:06 PM  PATIENT:  [name] MccallumNorma Oliver  83 y.o. female  PRE-OPERATIVE DIAGNOSIS:  periprosthetic left hip fracture  POST-OPERATIVE DIAGNOSIS:  periprosthetic left hip fracture  PROCEDURE:  Procedure(s): Revision of bipolar hip replacement into unipolar hip replacement (Left)   New implants along AML stem with a large proximal body, 45 head unipolar -3  Operative findings the patient actually had a lesser trochanteric fracture which extended distally in a greater trochanteric fracture and a wedged stem which had wedged down in the femur lower than its initial position  Details of procedure  The patient was identified in the preop area the left hip was marked for surgery the patient was taken to the operating room given preoperative antibiotics she was then given a spinal anesthetic. We positioned her in the lateral decubitus position and just prior to prep it was noted that she was not fully anesthetized  We took her out of the lateral decubitus position placed in the supine position she was intubated with general anesthesia  She was placed back in the lateral decubitus position and axillary roll was placed hip positioner was used to holder in place in her left leg was prepped and draped sterilely with Betadine after the skin staples from the first surgery were removed  We used the same incision extended to the little bit distally and divided subcutaneous tissue down to fascia. There was a small rent in the fascia and some old blood was noted. The fascia was opened in his previous line of incision and a large amount of old blood was evacuated with a small hole in the abductor mechanism  I extended the abductor soft tissue flap across the femur to allow for postoperative closure of the abductor musculature. I retracted proximally and then dislocated the hip. The hip prosthesis was not loose  However I noticed a greater trochanter fracture which was displaced and also the  lesser trochanteric fracture. The hip was ceases was wedged down below the calcar which of course is split off with the lesser trochanter  I used a bone tamp and mallet to extract the prosthesis  I then passed to cables and reduced the fracture but did not tension the cables  I then prepared the bone with serial reamings up to a 13 per manufacture technique and then put in a 13.5 stem however, the stem would not seat I removed it which was a little bit difficult and then reamed slightly more with a 13.5 and then I was able to pass the stem easily.   At this point I took the 2 cables which I passed and used for revisional reduction and since them down and got an excellent reduction of the lesser trochanter and calcar.  I did a trial reduction of the hip with the 46 x 1.5 head and could not get the head reduced and then tried a unipolar head -3 and could not reduce it  This was caused by the proximal body of the new implant being about 0.5 cm to a centimeter longer than the initial implant  I therefore did not feel that trying to remove the implant again was doable without extending the procedure and risking further propagation of the other fractures so I took a saw and made a calcar cut and then impacted the stem further and then used a 45-3 unipolar head and I was able to reduce the hip. The hip was stable to leg lengths were equal the sleep position was stable  X-ray taken at this time showed that the further impaction of the stem did cause some displacement of the lesser troches fracture   I then redislocated the hip placed the -3 head with 45 with an got a good reduction and stable reduction\  The issue at this point was that the fracture was that the lesser trochanter fracture slightly displaced during of further impaction of the stem.  I felt at this point it was better to leave things as they were as I had good distal fixation with the implant I had reestablished the calcar with cables.  I did have to place a third cable to help reduce the greater trochanter fracture and I also passed a #5 Ethibond suture in figure-of-eight fashion to help reduce the greater troches fracture  Thorough irrigation was performed. The abductors were closed with #5 suture and #1 Vicryl was used to close the muscle split in the abductors the fascia was closed with #1 Bralon subcutaneous was closed with 0 Monocryl the skin closed with staples and 30 mL of Marcaine with epinephrine was injected into the skin edges  Postoperative plan the patient can weight-bear as tolerated with a walker and physical therapy She will need 6 weeks of abduction pillow to support and take pressure off the greater trochanteric fracture 4 weeks of DVT prophylaxis     SURGEON:  Surgeon(s) and Role:    * Vickki Hearing, MD - Primary  PHYSICIAN ASSISTANT:   ASSISTANTS:  [place]  ANESTHESIA:  Spinal been general anesthesia   EBL:  Total I/O In: 2135 [I.V.:1800; Blood:335] Out: 690 [Urine:350; Blood:340]  BLOOD ADMINISTERED:1 u prbcs  DRAINS: none   LOCAL MEDICATIONS USED:  MARCAINE     SPECIMEN:  No Specimen  DISPOSITION OF SPECIMEN:  N/A  COUNTS:  YES  TOURNIQUET:  * No tourniquets in log *  DICTATION: .Dragon Dictation  PLAN OF CARE: Admit to inpatient   PATIENT DISPOSITION:  PACU - hemodynamically stable.   Delay start of Pharmacological VTE agent (>24hrs) due to surgical blood loss or risk of bleeding: yes   [id number]

## 2016-07-28 NOTE — Anesthesia Preprocedure Evaluation (Addendum)
Anesthesia Evaluation  Patient identified by MRN, date of birth, ID band Patient confused    Reviewed: Allergy & Precautions, NPO status , Patient's Chart, lab work & pertinent test results  Airway Mallampati: III  TM Distance: >3 FB Neck ROM: Limited   Comment: Very limited neck extension Dental  (+) Edentulous Upper, Partial Lower   Pulmonary neg pulmonary ROS,  Chronic cough, cxr neg.   breath sounds clear to auscultation       Cardiovascular hypertension, negative cardio ROS   Rhythm:Regular Rate:Normal     Neuro/Psych PSYCHIATRIC DISORDERS Parkinson's Dx with dementia.    GI/Hepatic negative GI ROS,   Endo/Other    Renal/GU      Musculoskeletal   Abdominal   Peds  Hematology  (+) anemia ,   Anesthesia Other Findings   Reproductive/Obstetrics                            Anesthesia Physical Anesthesia Plan  ASA: III  Anesthesia Plan: Spinal   Post-op Pain Management:    Induction:   Airway Management Planned: Simple Face Mask  Additional Equipment:   Intra-op Plan:   Post-operative Plan:   Informed Consent: I have reviewed the patients History and Physical, chart, labs and discussed the procedure including the risks, benefits and alternatives for the proposed anesthesia with the patient or authorized representative who has indicated his/her understanding and acceptance.     Plan Discussed with:   Anesthesia Plan Comments: (Possible GOT discussed with her daughter and she agrees with plan.)       Anesthesia Quick Evaluation

## 2016-07-28 NOTE — Transfer of Care (Signed)
Immediate Anesthesia Transfer of Care Note  Patient: [name] Oliver  Procedure(s) Performed: Procedure(s): Revision of bipolar hip replacement into unipolar hip replacement (Left)  Patient Location: PACU  Anesthesia Type:General  Level of Consciousness: awake and patient cooperative  Airway & Oxygen Therapy: Patient Spontanous Breathing and Patient connected to face mask oxygen  Post-op Assessment: Report given to RN and Post -op Vital signs reviewed and stable  Post vital signs: Reviewed and stable  Last Vitals:  Vitals:   07/28/16 1100 07/28/16 1105  BP: 132/76 124/62  Pulse:    Resp: 20 (!) 23  Temp:      Last Pain:  Vitals:   07/28/16 1040  TempSrc: Oral  PainSc:       Patients Stated Pain Goal: 2 (07/27/16 2100)  Complications: No apparent anesthesia complications

## 2016-07-29 ENCOUNTER — Encounter (HOSPITAL_COMMUNITY): Payer: Self-pay | Admitting: Orthopedic Surgery

## 2016-07-29 LAB — CBC
HCT: 28.5 % — ABNORMAL LOW (ref 36.0–46.0)
HEMOGLOBIN: 9.6 g/dL — AB (ref 12.0–15.0)
MCH: 31.5 pg (ref 26.0–34.0)
MCHC: 33.7 g/dL (ref 30.0–36.0)
MCV: 93.4 fL (ref 78.0–100.0)
Platelets: 483 10*3/uL — ABNORMAL HIGH (ref 150–400)
RBC: 3.05 MIL/uL — ABNORMAL LOW (ref 3.87–5.11)
RDW: 14.1 % (ref 11.5–15.5)
WBC: 16 10*3/uL — ABNORMAL HIGH (ref 4.0–10.5)

## 2016-07-29 LAB — URINE CULTURE: Culture: NO GROWTH

## 2016-07-29 LAB — BASIC METABOLIC PANEL
Anion gap: 6 (ref 5–15)
BUN: 21 mg/dL — AB (ref 6–20)
CHLORIDE: 106 mmol/L (ref 101–111)
CO2: 23 mmol/L (ref 22–32)
CREATININE: 0.64 mg/dL (ref 0.44–1.00)
Calcium: 8 mg/dL — ABNORMAL LOW (ref 8.9–10.3)
GFR calc Af Amer: >60 mL/min (ref >60)
GFR calc non Af Amer: >60 mL/min (ref >60)
GLUCOSE: 127 mg/dL — AB (ref 65–99)
POTASSIUM: 4 mmol/L (ref 3.5–5.1)
SODIUM: 135 mmol/L (ref 135–145)

## 2016-07-29 MED ORDER — CEFAZOLIN SODIUM-DEXTROSE 2-4 GM/100ML-% IV SOLN
INTRAVENOUS | Status: AC
  Filled 2016-07-29: qty 100

## 2016-07-29 MED ORDER — ACETAMINOPHEN 10 MG/ML IV SOLN
INTRAVENOUS | Status: AC
  Filled 2016-07-29: qty 100

## 2016-07-29 NOTE — Addendum Note (Signed)
Addendum  created 07/29/16 1620 by Despina Hiddenobert J Zasha Belleau, CRNA   Sign clinical note

## 2016-07-29 NOTE — Care Management Note (Addendum)
Case Management Note  Patient Details  Name: [name] Oliver MRN: [id number] Date of Birth: 11/05/1932  Subjective/Objective:                  [facility] S/p revision of bipolar hip x 1 day.   Action/Plan: [facility]. CSW aware of admission and making arrangements for return to SNF. CM will follow for needs.   Expected Discharge Date:       08/01/2016           Expected Discharge Plan:  Skilled Nursing Facility  In-House Referral:  Clinical Social Work  Discharge planning Services  CM Consult  Post Acute Care Choice:    Choice offered to:     DME Arranged:    DME Agency:     HH Arranged:    HH Agency:     Status of Service:  In process, will continue to follow  If discussed at Long Length of Stay Meetings, dates discussed:    Additional Comments:  Rook Maue, Chrystine OilerSharley Diane, RN 07/29/2016, 9:59 AM

## 2016-07-29 NOTE — Anesthesia Postprocedure Evaluation (Signed)
Anesthesia Post Note  Patient: [name] Oliver  Procedure(s) Performed: Procedure(s) (LRB): Revision of bipolar hip replacement into unipolar hip replacement (Left)  Patient location during evaluation: Nursing Unit Anesthesia Type: General Level of consciousness: awake and patient cooperative Pain management: pain level controlled Vital Signs Assessment: post-procedure vital signs reviewed and stable Respiratory status: spontaneous breathing, nonlabored ventilation and respiratory function stable Cardiovascular status: blood pressure returned to baseline Postop Assessment: adequate PO intake and no signs of nausea or vomiting Anesthetic complications: no     Last Vitals:  Vitals:   07/29/16 1248 07/29/16 1414  BP: (!) 116/49 (!) 133/50  Pulse: 96 (!) 101  Resp: 14 16  Temp: 37.9 C 37.7 C    Last Pain:  Vitals:   07/29/16 1414  TempSrc: Oral  PainSc:                  Massimiliano Rohleder J

## 2016-07-29 NOTE — Progress Notes (Signed)
PT Cancellation Note  Patient Details Name: [name] Oliver MRN: [id number] DOB: 11/25/1932   Cancelled Treatment:    Reason Eval/Treat Not Completed: Other (comment) (PT orders received, however, pt still with active bedrest orders.  Awaiting clarification from Dr. Romeo AppleHarrison regarding start of mobilization.  Will check back tomorrow. )   Beth Stepheni Cameron, PT, DPT X: [phone]

## 2016-07-29 NOTE — Progress Notes (Signed)
Patient ID: [name] Oliver, female   DOB: 04/24/1933, 83 y.o.   MRN: [id number]   POD 1  REVISION LEFT HIP BIPOLAR FOR PERIPROSTHETIC FRACTURE RIGHT HIP   BP (!) 120/53 (BP Location: Right Arm)   Pulse 97   Temp 98.4 F (36.9 C) (Oral)   Resp 18   Ht 5\' 2"  (1.575 m)   Wt 123 lb (55.8 kg)   SpO2 94%   BMI 22.50 kg/m  CBC Latest Ref Rng & Units 07/29/2016 07/28/2016 07/27/2016  WBC 4.0 - 10.5 K/uL 16.0(H) - -  Hemoglobin 12.0 - 15.0 g/dL 4.0(J9.6(L) 11.1(L) 11.0(L)  Hematocrit 36.0 - 46.0 % 28.5(L) - 32.9(L)  Platelets 150 - 400 K/uL 483(H) - -   BMP Latest Ref Rng & Units 07/29/2016 07/26/2016 07/25/2016  Glucose 65 - 99 mg/dL 811(B127(H) 147(W110(H) 295(A123(H)  BUN 6 - 20 mg/dL 21(H21(H) 08(M21(H) 57(Q21(H)  Creatinine 0.44 - 1.00 mg/dL 4.690.64 6.290.67 5.280.82  Sodium 135 - 145 mmol/L 135 140 141  Potassium 3.5 - 5.1 mmol/L 4.0 4.0 4.1  Chloride 101 - 111 mmol/L 106 106 108  CO2 22 - 32 mmol/L 23 27 27   Calcium 8.9 - 10.3 mg/dL 8.0(L) 8.7(L) 8.8(L)    CONFUSED , SHE DID RECOGNIZE ME TODAY  URINE CULTURE IN/OUT CATH  BED REST TODAY   ABDUCTION PILLOW IS CRITICAL , NEEDS TO STAY IN PLACE   CONTINUE SUPPORTIVE CARE AND MONITORING , START PT TOMORROW

## 2016-07-29 NOTE — Op Note (Signed)
07/28/2016  5:06 PM  PATIENT:  [name] MccallumNorma Oliver  83 y.o. female  PRE-OPERATIVE DIAGNOSIS:  periprosthetic left hip fracture  POST-OPERATIVE DIAGNOSIS:  periprosthetic left hip fracture  PROCEDURE:  Procedure(s): Revision of bipolar hip replacement into unipolar hip replacement (Left)   New implants along AML stem with a large proximal body, 45 head unipolar -3  Operative findings the patient actually had a lesser trochanteric fracture which extended distally in a greater trochanteric fracture and a wedged stem which had wedged down in the femur lower than its initial position  Details of procedure  The patient was identified in the preop area the left hip was marked for surgery the patient was taken to the operating room given preoperative antibiotics she was then given a spinal anesthetic. We positioned her in the lateral decubitus position and just prior to prep it was noted that she was not fully anesthetized  We took her out of the lateral decubitus position placed in the supine position she was intubated with general anesthesia  She was placed back in the lateral decubitus position and axillary roll was placed hip positioner was used to holder in place in her left leg was prepped and draped sterilely with Betadine after the skin staples from the first surgery were removed  We used the same incision extended to the little bit distally and divided subcutaneous tissue down to fascia. There was a small rent in the fascia and some old blood was noted. The fascia was opened in his previous line of incision and a large amount of old blood was evacuated with a small hole in the abductor mechanism  I extended the abductor soft tissue flap across the femur to allow for postoperative closure of the abductor musculature. I retracted proximally and then dislocated the hip. The hip prosthesis was not loose  However I noticed a greater trochanter fracture which was displaced and also the lesser  trochanteric fracture. The hip was ceases was wedged down below the calcar which of course is split off with the lesser trochanter  I used a bone tamp and mallet to extract the prosthesis  I then passed to cables and reduced the fracture but did not tension the cables  I then prepared the bone with serial reamings up to a 13 per manufacture technique and then put in a 13.5 stem however, the stem would not seat I removed it which was a little bit difficult and then reamed slightly more with a 13.5 and then I was able to pass the stem easily.   At this point I took the 2 cables which I passed and used for revisional reduction and since them down and got an excellent reduction of the lesser trochanter and calcar.  I did a trial reduction of the hip with the 46 x 1.5 head and could not get the head reduced and then tried a unipolar head -3 and could not reduce it  This was caused by the proximal body of the new implant being about 0.5 cm to a centimeter longer than the initial implant  I therefore did not feel that trying to remove the implant again was doable without extending the procedure and risking further propagation of the other fractures so I took a saw and made a calcar cut and then impacted the stem further and then used a 45-3 unipolar head and I was able to reduce the hip. The hip was stable to leg lengths were equal the sleep position was stable  X-ray  taken at this time showed that the further impaction of the stem did cause some displacement of the lesser troches fracture   I then redislocated the hip placed the -3 head with 45 with an got a good reduction and stable reduction\  The issue at this point was that the fracture was that the lesser trochanter fracture slightly displaced during of further impaction of the stem.  I felt at this point it was better to leave things as they were as I had good distal fixation with the implant I had reestablished the calcar with cables. I did  have to place a third cable to help reduce the greater trochanter fracture and I also passed a #5 Ethibond suture in figure-of-eight fashion to help reduce the greater troches fracture  Thorough irrigation was performed. The abductors were closed with #5 suture and #1 Vicryl was used to close the muscle split in the abductors the fascia was closed with #1 Bralon subcutaneous was closed with 0 Monocryl the skin closed with staples and 30 mL of Marcaine with epinephrine was injected into the skin edges  Postoperative plan the patient can weight-bear as tolerated with a walker and physical therapy She will need 6 weeks of abduction pillow to support and take pressure off the greater trochanteric fracture 4 weeks of DVT prophylaxis     SURGEON:  Surgeon(s) and Role:    * Vickki Hearing, MD - Primary  PHYSICIAN ASSISTANT:   ASSISTANTS:  [place]  ANESTHESIA:  Spinal been general anesthesia   EBL:  Total I/O In: 2135 [I.V.:1800; Blood:335] Out: 690 [Urine:350; Blood:340]  BLOOD ADMINISTERED:1 u prbcs  DRAINS: none   LOCAL MEDICATIONS USED:  MARCAINE     SPECIMEN:  No Specimen  DISPOSITION OF SPECIMEN:  N/A  COUNTS:  YES  TOURNIQUET:  * No tourniquets in log *  DICTATION: .Dragon Dictation  PLAN OF CARE: Admit to inpatient   PATIENT DISPOSITION:  PACU - hemodynamically stable.   Delay start of Pharmacological VTE agent (>24hrs) due to surgical blood loss or risk of bleeding: yes   27236-22

## 2016-07-30 LAB — BASIC METABOLIC PANEL
Anion gap: 5 (ref 5–15)
BUN: 17 mg/dL (ref 6–20)
CO2: 24 mmol/L (ref 22–32)
Calcium: 8 mg/dL — ABNORMAL LOW (ref 8.9–10.3)
Chloride: 109 mmol/L (ref 101–111)
Creatinine, Ser: 0.48 mg/dL (ref 0.44–1.00)
GFR calc Af Amer: >60 mL/min (ref >60)
GLUCOSE: 117 mg/dL — AB (ref 65–99)
POTASSIUM: 3.5 mmol/L (ref 3.5–5.1)
Sodium: 138 mmol/L (ref 135–145)

## 2016-07-30 LAB — CBC
HEMATOCRIT: 27 % — AB (ref 36.0–46.0)
HEMOGLOBIN: 9 g/dL — AB (ref 12.0–15.0)
MCH: 31.4 pg (ref 26.0–34.0)
MCHC: 33.3 g/dL (ref 30.0–36.0)
MCV: 94.1 fL (ref 78.0–100.0)
Platelets: 487 10*3/uL — ABNORMAL HIGH (ref 150–400)
RBC: 2.87 MIL/uL — ABNORMAL LOW (ref 3.87–5.11)
RDW: 13.9 % (ref 11.5–15.5)
WBC: 16.6 10*3/uL — ABNORMAL HIGH (ref 4.0–10.5)

## 2016-07-30 MED ORDER — ACETAMINOPHEN 500 MG PO TABS
500.0000 mg | ORAL_TABLET | Freq: Four times a day (QID) | ORAL | Status: DC | PRN
  Administered 2016-07-30 – 2016-08-01 (×4): 500 mg via ORAL
  Filled 2016-07-30 (×4): qty 1

## 2016-07-30 NOTE — Progress Notes (Signed)
Subjective: 2 Days Post-Op Procedure(s) (LRB): Revision of bipolar hip replacement into unipolar hip replacement (Left) Patient reports pain as could not assess.    Objective: Vital signs in last 24 hours: Temp:  [98.5 F (36.9 C)-99.6 F (37.6 C)] 99.6 F (37.6 C) (02/23 1545) Pulse Rate:  [95-96] 95 (02/23 1501) Resp:  [18] 18 (02/23 1501) BP: (123-138)/(46-70) 138/70 (02/23 1545) SpO2:  [95 %-97 %] 95 % (02/23 1501)  Intake/Output from previous day: 02/22 0701 - 02/23 0700 In: 1260 [P.O.:360; I.V.:900] Out: 150 [Urine:150] Intake/Output this shift: No intake/output data recorded.   Recent Labs  07/28/16 1714 07/29/16 0541 07/30/16 0521  HGB 11.1* 9.6* 9.0*    Recent Labs  07/29/16 0541 07/30/16 0521  WBC 16.0* 16.6*  RBC 3.05* 2.87*  HCT 28.5* 27.0*  PLT 483* 487*    Recent Labs  07/29/16 0541 07/30/16 0521  NA 135 138  K 4.0 3.5  CL 106 109  CO2 23 24  BUN 21* 17  CREATININE 0.64 0.48  GLUCOSE 127* 117*  CALCIUM 8.0* 8.0*   No results for input(s): LABPT, INR in the last 72 hours.  Neurologically intact Neurovascular intact Sensation intact distally Intact pulses distally Incision: scant drainage  Assessment/Plan: 2 Days Post-Op Procedure(s) (LRB): Revision of bipolar hip replacement into unipolar hip replacement (Left)   She is in stable condition. She has severe dementia which is worse due to institutional stay over the last 3 weeks  She has acute blood loss anemia  She will have extended recovery Mobility even if sitting is of utmost importance ucx negative  Urology consult was requested to eval frequent utis  [name] Oliver 07/30/2016, 8:58 PM

## 2016-07-30 NOTE — Evaluation (Signed)
Physical Therapy Evaluation Patient Details Name: [name] Oliver MRN: [id number] DOB: 06/02/1933 Today's Date: 07/30/2016   History of Present Illness  83 year old female had a femoral neck fracture treated with a bipolar uncemented prosthesis. She went to the nursing home. She developed a UTI. She fell and fractured her lesser trochanter. She also developed a UTI which she is currently being treated for.  Operative findings the patient actually had a lesser trochanteric fracture which extended distally in a greater trochanteric fracture and a wedged stem which had wedged down in the femur lower than its initial position.  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   Clinical Impression  Pt received in bed, dtr present, but stepped out for evaluation.  [facility] and was working with PT there on transfers and starting to work on gait.  She has a history of Parkinson's and demonstrates flat affect with poor motor planning and rigidity during mobility.  She required Max A +2 for transfer supine<>sit with pillow in between legs to maintain neutral hip.  Once she was able to get feet flat on the floor, she was able to hold static sitting balance with supervision.  She demonstrates flexed forward posture and requires cues for sitting upright.  Further mobility was limited due to pain and cognitive status at this time.      Follow Up Recommendations SNF    Equipment Recommendations  None recommended by PT    Recommendations for Other Services       Precautions / Restrictions Precautions Precautions: Fall Precaution Comments: Reason for admission.  Pt also seen a week ago for THA due to fall.  Restrictions LLE Weight Bearing: Weight bearing as tolerated      Mobility  Bed Mobility Overal bed mobility: Needs Assistance Bed Mobility: Supine to Sit;Rolling;Sit to Supine Rolling:  Max assist;+2 for physical assistance;+2 for safety/equipment (To assist with hygiene after being incontinent of urine. )   Supine to sit: Max assist;HOB elevated;+2 for physical assistance;+2 for safety/equipment Sit to supine: Max assist;+2 for safety/equipment;+2 for physical assistance   General bed mobility comments: pillow between knees during rolling and transfer.  Once pt is able to get feet flat on the floor, she is able to sit on the EOB with supervision.    Transfers Overall transfer level:  (Not appropriate today due to pt's cognition, as well as increased pain level today.  )                  Ambulation/Gait                Stairs            Wheelchair Mobility    Modified Rankin (Stroke Patients Only)       Balance   Sitting-balance support: Bilateral upper extremity supported;Feet supported Sitting balance-Leahy Scale: Poor Sitting balance - Comments: Pt demonstrates posterior and right lean when intially sitting on the EOB.  When feet are supported she only requires supervision.                                       Pertinent Vitals/Pain Pain Assessment: Faces Faces Pain Scale: Hurts whole lot Pain Location: L hip Pain Descriptors / Indicators: Grimacing;Guarding Pain Intervention(s): Limited activity within patient's tolerance;Monitored during session;Repositioned;Relaxation;Ice  applied    Home Living Family/patient expects to be discharged to:: Skilled nursing facility Living Arrangements: Spouse/significant other   Type of Home: House Home Access: Stairs to enter   Entrance Stairs-Number of Steps: 1 Home Layout: One level        Prior Function Level of Independence: Needs assistance   Gait / Transfers Assistance Needed: Pt has recently been over at the [facility], and she has been working on transfers with assistances, as well as taking a few steps with assistance. Prior to her initial fall she was  independent with all ambulation.             Hand Dominance   Dominant Hand: Right    Extremity/Trunk Assessment   Upper Extremity Assessment Upper Extremity Assessment: Generalized weakness    Lower Extremity Assessment Lower Extremity Assessment: Generalized weakness       Communication      Cognition Arousal/Alertness: Lethargic Behavior During Therapy: WFL for tasks assessed/performed Overall Cognitive Status: Impaired/Different from baseline Area of Impairment: Safety/judgement         Safety/Judgement: Decreased awareness of safety;Decreased awareness of deficits          General Comments      Exercises     Assessment/Plan    PT Assessment Patient needs continued PT services  PT Problem List         PT Treatment Interventions DME instruction;Gait training;Functional mobility training;Therapeutic activities;Therapeutic exercise;Balance training;Neuromuscular re-education;Cognitive remediation;Patient/family education    PT Goals (Current goals can be found in the Care Plan section)  Acute Rehab PT Goals Patient Stated Goal: None stated PT Goal Formulation: With patient Time For Goal Achievement: 08/06/16 Potential to Achieve Goals: Poor    Frequency 7X/week   Barriers to discharge        Co-evaluation PT/OT/SLP Co-Evaluation/Treatment: Yes Reason for Co-Treatment: Complexity of the patient's impairments (multi-system involvement);Necessary to address cognition/behavior during functional activity;For patient/therapist safety;To address functional/ADL transfers PT goals addressed during session: Mobility/safety with mobility;Balance         End of Session   Activity Tolerance: Patient limited by pain Patient left: in bed;with call bell/phone within reach;with family/visitor present;Other (comment);with bed alarm set Neurosurgeon) Nurse Communication: Mobility status ([place] RN notified of pt's mobiltiy status, and that she requires +2  person to sit on the EOB.  )      Functional Assessment Tool Used: AM-PAC 6 Clicks Basic Mobility;Clinical judgement Functional Assessment Tool Used (Outpatient Only): [employer] AM-PAC "6-clicks"  Functional Limitation: Mobility: Walking and moving around Mobility: Walking and Moving Around Current Status [phone]): At least 80 percent but less than 100 percent impaired, limited or restricted Mobility: Walking and Moving Around Goal Status [phone]): At least 60 percent but less than 80 percent impaired, limited or restricted    Time: 1110-1136 PT Time Calculation (min) (ACUTE ONLY): 26 min   Charges:   PT Evaluation $PT Eval Low Complexity: 1 Procedure     PT G Codes:   PT G-Codes **NOT FOR INPATIENT CLASS** Functional Assessment Tool Used: AM-PAC 6 Clicks Basic Mobility;Clinical judgement Functional Assessment Tool Used (Outpatient Only): [employer] AM-PAC "6-clicks"  Functional Limitation: Mobility: Walking and moving around Mobility: Walking and Moving Around Current Status [phone]): At least 80 percent but less than 100 percent impaired, limited or restricted Mobility: Walking and Moving Around Goal Status [phone]): At least 60 percent but less than 80 percent impaired, limited or restricted     Beth Marlyn Rabine, PT, DPT X: [phone]

## 2016-07-30 NOTE — Evaluation (Signed)
Occupational Therapy Evaluation Patient Details Name: [name] Oliver MRN: [id number] DOB: 02/16/1933 Today's Date: 07/30/2016    History of Present Illness 83 year old female had a femoral neck fracture treated with a bipolar uncemented prosthesis. She went to the nursing home. She developed a UTI. She fell and fractured her lesser trochanter. She also developed a UTI which she is currently being treated for.  Operative findings the patient actually had a lesser trochanteric fracture which extended distally in a greater trochanteric fracture and a wedged stem which had wedged down in the femur lower than its initial position.  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    Clinical Impression   Patient in bed upon therapy arrival. Daughter present although stepped out for toileting and evaluation. Patient was resistant to therapy participation although therapist did provide education and the importance of participating. Patient was able to transition to sitting on EOB with Max x2. While seated she washed/dryed her face. Patient presents with BUE weakness, decreased endurance and safety awareness, and decrease BADL tasks/functional transfers. Patient will benefit from skilled OT services to increase functional performance during ADL tasks. Recommend patient return to SNF to continue therapy.    Follow Up Recommendations  SNF    Equipment Recommendations  None recommended by OT       Precautions / Restrictions Precautions Precautions: Fall Precaution Comments: Reason for admission.  Pt also seen a week ago for THA due to fall.  Restrictions Weight Bearing Restrictions: Yes LLE Weight Bearing: Weight bearing as tolerated      Mobility Bed Mobility Overal bed mobility: Needs Assistance Bed Mobility: Supine to Sit;Rolling;Sit to Supine Rolling: Max assist;+2 for physical assistance;+2 for  safety/equipment (To assist with hygiene after being incontinent of urine. )   Supine to sit: Max assist;HOB elevated;+2 for physical assistance;+2 for safety/equipment Sit to supine: Max assist;+2 for safety/equipment;+2 for physical assistance   General bed mobility comments: pillow between knees during rolling and transfer.  Once pt is able to get feet flat on the floor, she is able to sit on the EOB with supervision.    Transfers Overall transfer level:  (Not appropriate today due to pt's cognition, as well as increased pain level today.  )                    Balance   Sitting-balance support: Bilateral upper extremity supported;Feet supported Sitting balance-Leahy Scale: Poor Sitting balance - Comments: Pt demonstrates posterior and right lean when intially sitting on the EOB.  When feet are supported she only requires supervision.                                      ADL Overall ADL's : Needs assistance/impaired     Grooming: Wash/dry face;Minimal assistance;Sitting Grooming Details (indicate cue type and reason): with therapist placing washcloth in hand and provided physical assistance to maintain balance when needed for safety.                               General ADL Comments: transfer not completed at this session. Patient only transitioned from supine to sitting on EOB for a short amount of time. Patient is unable to tolerate transfer at this time.      Vision  Baseline Vision/History:  (unknown) Patient Visual Report:  (unknown)              Pertinent Vitals/Pain Pain Assessment: Faces Faces Pain Scale: Hurts whole lot Pain Location: L hip Pain Descriptors / Indicators: Grimacing;Guarding Pain Intervention(s): Limited activity within patient's tolerance;Monitored during session;Repositioned;Relaxation;Ice applied     Hand Dominance Right   Extremity/Trunk Assessment Upper Extremity Assessment Upper Extremity Assessment:  Generalized weakness   Lower Extremity Assessment Lower Extremity Assessment: Defer to PT evaluation       Communication Communication Communication: No difficulties   Cognition Arousal/Alertness: Lethargic Behavior During Therapy: WFL for tasks assessed/performed Overall Cognitive Status: Impaired/Different from baseline Area of Impairment: Safety/judgement         Safety/Judgement: Decreased awareness of safety;Decreased awareness of deficits                    Home Living Family/patient expects to be discharged to:: Skilled nursing facility Living Arrangements: Spouse/significant other   Type of Home: House Home Access: Stairs to enter Secretary/administrator of Steps: 1   Home Layout: One level     Bathroom Shower/Tub: Chief Strategy Officer: Standard                Prior Functioning/Environment Level of Independence: Needs assistance  Gait / Transfers Assistance Needed: Pt has recently been over at the [facility], and she has been working on transfers with assistances, as well as taking a few steps with assistance. Prior to her initial fall she was independent with all ambulation.                OT Problem List: Decreased strength;Pain;Decreased knowledge of precautions;Decreased knowledge of use of DME or AE;Impaired balance (sitting and/or standing);Decreased activity tolerance;Decreased safety awareness      OT Treatment/Interventions: Self-care/ADL training;Balance training;Therapeutic exercise;Therapeutic activities;DME and/or AE instruction;Patient/family education    OT Goals(Current goals can be found in the care plan section) Acute Rehab OT Goals Patient Stated Goal: None stated OT Goal Formulation: Patient unable to participate in goal setting Time For Goal Achievement: 08/13/16 Potential to Achieve Goals: Fair  OT Frequency: Min 2X/week           Co-evaluation PT/OT/SLP Co-Evaluation/Treatment: Yes Reason  for Co-Treatment: Complexity of the patient's impairments (multi-system involvement);For patient/therapist safety;Necessary to address cognition/behavior during functional activity;To address functional/ADL transfers PT goals addressed during session: Mobility/safety with mobility;Balance OT goals addressed during session: ADL's and self-care      End of Session Equipment Utilized During Treatment:  (None) Nurse Communication: Mobility status  Activity Tolerance: Patient limited by pain;Patient limited by fatigue;Patient limited by lethargy Patient left: in bed;with call bell/phone within reach;with family/visitor present (telesitter)  OT Visit Diagnosis: Muscle weakness (generalized) (M62.81)                ADL either performed or assessed with clinical judgement  Time: 1110-1136 OT Time Calculation (min): 26 min Charges:  OT General Charges $OT Visit: 1 Procedure OT Evaluation $OT Eval Low Complexity: 1 Procedure G-Codes:     Limmie Patricia, OTR/L,CBIS  [phone]   Tery Hoeger, Charisse March 07/30/2016, 3:46 PM

## 2016-07-30 NOTE — Care Management Important Message (Signed)
Important Message  Patient Details  Name: [name] Oliver MRN: [id number] Date of Birth: 07/04/1932   Medicare Important Message Given:  Yes    Malcolm MetroChildress, Harleigh Civello Demske, RN 07/30/2016, 9:50 AM

## 2016-07-30 NOTE — Clinical Social Work Note (Signed)
CSW updated [facility] on pt. Discussed with MD who reports anticipate d/c is Monday. Facility agreeable to return.  Derenda FennelKara Glade Strausser, LCSW [phone]

## 2016-07-31 LAB — CBC
HEMATOCRIT: 24 % — AB (ref 36.0–46.0)
HEMOGLOBIN: 8 g/dL — AB (ref 12.0–15.0)
MCH: 31.6 pg (ref 26.0–34.0)
MCHC: 33.3 g/dL (ref 30.0–36.0)
MCV: 94.9 fL (ref 78.0–100.0)
Platelets: 516 10*3/uL — ABNORMAL HIGH (ref 150–400)
RBC: 2.53 MIL/uL — AB (ref 3.87–5.11)
RDW: 13.9 % (ref 11.5–15.5)
WBC: 14.2 10*3/uL — AB (ref 4.0–10.5)

## 2016-07-31 LAB — PREPARE RBC (CROSSMATCH)

## 2016-07-31 LAB — TYPE AND SCREEN
BLOOD PRODUCT EXPIRATION DATE: 201803282359
Blood Product Expiration Date: 201803172359
Blood Product Expiration Date: 201803222359
ISSUE DATE / TIME: 201802201531
ISSUE DATE / TIME: 201802211500
Unit Type and Rh: 600
Unit Type and Rh: 600
Unit Type and Rh: 9500

## 2016-07-31 LAB — URINE CULTURE: Culture: NO GROWTH

## 2016-07-31 MED ORDER — SODIUM CHLORIDE 0.9 % IV SOLN: Freq: Once | INTRAVENOUS | Status: DC

## 2016-07-31 NOTE — Progress Notes (Signed)
PT Cancellation Note  Patient Details Name: [name] Oliver MRN: [id number] DOB: 12/07/1932   Cancelled Treatment:    Spoke with RN, who confirms that patient is getting blood today however willing to wait until after PT is done to do this.  Patient, however, refuses skilled PT services at this time, stating "I'd rather just do this tomorrow as I'm just laying here". Unable to convince patient to participate with PT session today- will try back tomorrow if patient status is appropriate.    Nedra HaiKristen Elige Shouse PT, DPT [phone]

## 2016-07-31 NOTE — Progress Notes (Addendum)
Subjective: 3 Days Post-Op Procedure(s) (LRB): Revision of bipolar hip replacement into unipolar hip replacement (Left) Patient reports pain  patient confused but awakens easily  Objective: Vital signs in last 24 hours: Temp:  [98.3 F (36.8 C)-99.6 F (37.6 C)] 98.3 F (36.8 C) (02/24 0500) Pulse Rate:  [93-100] 100 (02/24 0500) Resp:  [18-20] 20 (02/24 0500) BP: (123-138)/(46-70) 137/58 (02/24 0500) SpO2:  [93 %-99 %] 93 % (02/24 0755)  Intake/Output from previous day: 02/23 0701 - 02/24 0700 In: 848.8 [P.O.:240; I.V.:608.8] Out: -  Intake/Output this shift: No intake/output data recorded.   Recent Labs  07/28/16 1714 07/29/16 0541 07/30/16 0521 07/31/16 0717  HGB 11.1* 9.6* 9.0* 8.0*    Recent Labs  07/30/16 0521 07/31/16 0717  WBC 16.6* 14.2*  RBC 2.87* 2.53*  HCT 27.0* 24.0*  PLT 487* 516*    Recent Labs  07/29/16 0541 07/30/16 0521  NA 135 138  K 4.0 3.5  CL 106 109  CO2 23 24  BUN 21* 17  CREATININE 0.64 0.48  GLUCOSE 127* 117*  CALCIUM 8.0* 8.0*   No results for input(s): LABPT, INR in the last 72 hours.  Scant drainage from incision. Mild thigh swelling. No signs of bleeding from the surgical site that would explain a hemoglobin of 8. Otherwise she is remains confused  Assessment/Plan: 3 Days Post-Op Procedure(s) (LRB): Revision of bipolar hip replacement into unipolar hip replacement (Left) Transfusion one unit today and 1 unit tomorrow check hemoglobin again on Sunday afternoon (Acute blood loss anemia) Fuller CanadaStanley Harrison 07/31/2016, 9:57 AM

## 2016-07-31 NOTE — Progress Notes (Signed)
Paged Dr. Romeo AppleHarrison to notify him of the patients temp 99.7 ax and tachycardiac 100-112.  Patient seems to be more lucent today and she states that she does not feel well.  She drank about 1/2 ensure but ate very poor today. Will discuss the situation with the oncoming nurse so that she may now wait on his call return/follow up.

## 2016-08-01 LAB — CBC WITH DIFFERENTIAL/PLATELET
BASOS ABS: 0 10*3/uL (ref 0.0–0.1)
BASOS PCT: 0 %
Eosinophils Absolute: 0.2 10*3/uL (ref 0.0–0.7)
Eosinophils Relative: 1 %
HEMATOCRIT: 31.4 % — AB (ref 36.0–46.0)
HEMOGLOBIN: 10.6 g/dL — AB (ref 12.0–15.0)
LYMPHS PCT: 8 %
Lymphs Abs: 1.2 10*3/uL (ref 0.7–4.0)
MCH: 31.2 pg (ref 26.0–34.0)
MCHC: 33.8 g/dL (ref 30.0–36.0)
MCV: 92.4 fL (ref 78.0–100.0)
Monocytes Absolute: 1.1 10*3/uL — ABNORMAL HIGH (ref 0.1–1.0)
Monocytes Relative: 7 %
NEUTROS PCT: 84 %
Neutro Abs: 13 10*3/uL — ABNORMAL HIGH (ref 1.7–7.7)
Platelets: 603 10*3/uL — ABNORMAL HIGH (ref 150–400)
RBC: 3.4 MIL/uL — ABNORMAL LOW (ref 3.87–5.11)
RDW: 14.3 % (ref 11.5–15.5)
WBC: 15.5 10*3/uL — ABNORMAL HIGH (ref 4.0–10.5)

## 2016-08-01 NOTE — Progress Notes (Signed)
Subjective: 4 Days Post-Op Procedure(s) (LRB): Revision of bipolar hip replacement into unipolar hip replacement (Left) Patient reports pain as can not assess still confused but she is not groaning .    Objective: Vital signs in last 24 hours: Temp:  [98 F (36.7 C)-100.5 F (38.1 C)] 98.9 F (37.2 C) (02/25 0435) Pulse Rate:  [94-112] 97 (02/25 0435) Resp:  [18-20] 20 (02/25 0435) BP: (129-145)/(53-65) 145/65 (02/25 0435) SpO2:  [95 %-97 %] 96 % (02/25 0435)  Intake/Output from previous day: 02/24 0701 - 02/25 0700 In: 540 [P.O.:120; Blood:420] Out: -  Intake/Output this shift: No intake/output data recorded.   Recent Labs  07/30/16 0521 07/31/16 0717  HGB 9.0* 8.0*    Recent Labs  07/30/16 0521 07/31/16 0717  WBC 16.6* 14.2*  RBC 2.87* 2.53*  HCT 27.0* 24.0*  PLT 487* 516*    Recent Labs  07/30/16 0521  NA 138  K 3.5  CL 109  CO2 24  BUN 17  CREATININE 0.48  GLUCOSE 117*  CALCIUM 8.0*   No results for input(s): LABPT, INR in the last 72 hours.  Neurologically intact Neurovascular intact Sensation intact distally Intact pulses distally Dorsiflexion/Plantar flexion intact Incision: scant drainage  Assessment/Plan: 4 Days Post-Op Procedure(s) (LRB): Revision of bipolar hip replacement into unipolar hip replacement (Left) Up with therapy Discharge to SNF tomorrow  Fuller CanadaStanley Meelah Tallo 08/01/2016, 9:30 AM

## 2016-08-01 NOTE — Progress Notes (Signed)
Physical Therapy Treatment Patient Details Name: [name] Oliver MRN: [id number] DOB: 02/08/1933 Today's Date: 08/01/2016    History of Present Illness 83 year old female had a femoral neck fracture treated with a bipolar uncemented prosthesis. She went to the nursing home. She developed a UTI. She fell and fractured her lesser trochanter. She also developed a UTI which she is currently being treated for.  Operative findings the patient actually had a lesser trochanteric fracture which extended distally in a greater trochanteric fracture and a wedged stem which had wedged down in the femur lower than its initial position.  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     PT Comments    Patient received in bed with HOB elevated, initially reluctant to participate in PT today but able to be convinced. Per husband patient still has pill in her mouth and won't swallow it- DPT able to get patient to swallow pill prior to exercise however. Attempted bed mobility today but unable to do so safely within hip precautions with assist of just +1/patient quickly became disagreeable to bed mobility. Then performed exercises in bed, all within hip precautions, and with Min-Mod assist from PT. Poor initiation and movement through range noted without assist and verbal cues. Patient only agreeable to short bout of exercise today. Patient left supine in bed with HOB elevated, wedge between LEs to maintain hip precautions, bed alarm activated, and all needs otherwise met.    Follow Up Recommendations  SNF     Equipment Recommendations  None recommended by PT    Recommendations for Other Services       Precautions / Restrictions Precautions Precautions: Fall Precaution Comments: Reason for admission.  Pt also seen a week ago for THA due to fall.  Restrictions Weight Bearing Restrictions: Yes LLE Weight Bearing: Weight  bearing as tolerated    Mobility  Bed Mobility               General bed mobility comments: attempted with +1 assist but unable to do so safely due to low level of assist from patient   Transfers                    Ambulation/Gait                 Stairs            Wheelchair Mobility    Modified Rankin (Stroke Patients Only)       Balance                                    Cognition Arousal/Alertness: Awake/alert Behavior During Therapy: Flat affect Overall Cognitive Status: Impaired/Different from baseline Area of Impairment: Safety/judgement         Safety/Judgement: Decreased awareness of safety;Decreased awareness of deficits     General Comments: A&O x4 but poor attention noted     Exercises Total Joint Exercises Ankle Circles/Pumps: Left;AROM;10 reps Ankle Circles/Pumps Limitations: verbal and tactile cues for full ROM  Heel Slides: Left;AAROM;5 reps Heel Slides Limitations: poor initiation, verbal and tactile cues  Straight Leg Raises: Right;10 reps;Other (comment);AAROM (within hip precautions )    General Comments        Pertinent Vitals/Pain Pain Assessment: Faces Faces Pain Scale: Hurts a little bit Pain Location: L hip Pain Descriptors / Indicators:  Grimacing;Guarding Pain Intervention(s): Limited activity within patient's tolerance;Monitored during session    Home Living                      Prior Function            PT Goals (current goals can now be found in the care plan section)      Frequency    7X/week      PT Plan Current plan remains appropriate    Co-evaluation             End of Session   Activity Tolerance: Patient limited by lethargy Patient left: in bed;with bed alarm set;with call bell/phone within reach         Time: 1010-1030 PT Time Calculation (min) (ACUTE ONLY): 20 min  Charges:  $Therapeutic Exercise: 8-22 mins                    G Codes:        Deniece Ree PT, DPT [phone]

## 2016-08-01 NOTE — Discharge Summary (Addendum)
Physician Discharge Summary  Patient ID: [name] Oliver MRN: [id number] DOB/AGE: 1933/05/02 83 y.o.  Admit date: 07/26/2016 Discharge date: 08/01/2016  Admission Diagnoses: periprosthetic fracture left hip   Discharge Diagnoses:  Active Problems:   Periprosthetic fracture around internal prosthetic hip joint post op acute blood loss anemia    Discharged Condition: stable  Hospital Course:  Admit 2/19 for preop workup for periprosthetic left hip fracture  2/20 - lab work and fluids to prepare for surgery  2/21 - removal of left hip prosthesis , repair of trochanteric fractures and reinsertion of longer stem prosthesis  2/22 -26 - attempts at PT , transfusion for hg 8, ucx x 2 negative    Discharge Exam: Blood pressure (!) 145/65, pulse 97, temperature 98.9 F (37.2 C), temperature source Oral, resp. rate 20, height 5\' 2"  (1.575 m), weight 123 lb (55.8 kg), SpO2 96 %. Major confusion with sundowning  CBC Latest Ref Rng & Units 08/02/2016 08/01/2016 07/31/2016  WBC 4.0 - 10.5 K/uL - 15.5(H) 14.2(H)  Hemoglobin 12.0 - 15.0 g/dL 10.3(L) 10.6(L) 8.0(L)  Hematocrit 36.0 - 46.0 % 30.6(L) 31.4(L) 24.0(L)  Platelets 150 - 400 K/uL - 603(H) 516(H)   BMP Latest Ref Rng & Units 07/30/2016 07/29/2016 07/26/2016  Glucose 65 - 99 mg/dL 409(W) 119(J) 478(G)  BUN 6 - 20 mg/dL 17 95(A) 21(H)  Creatinine 0.44 - 1.00 mg/dL 0.86 5.78 4.69  Sodium 135 - 145 mmol/L 138 135 140  Potassium 3.5 - 5.1 mmol/L 3.5 4.0 4.0  Chloride 101 - 111 mmol/L 109 106 106  CO2 22 - 32 mmol/L 24 23 27   Calcium 8.9 - 10.3 mg/dL 8.0(L) 8.0(L) 8.7(L)    Disposition: nursing center    Allergies as of 08/02/2016      Reactions   Morphine And Related Other (See Comments)   Hallucinations   Prednisone Other (See Comments)   manic   Namzaric [memantine Hcl-donepezil Hcl] Anxiety      Medication List    STOP taking these medications   ciprofloxacin 250 MG tablet Commonly known as:  CIPRO     TAKE these medications    acetaminophen 500 MG tablet Commonly known as:  TYLENOL Take 1 tablet (500 mg total) by mouth every 6 (six) hours.   aspirin 325 MG EC tablet Take 1 tablet (325 mg total) by mouth daily with breakfast.   bisacodyl 5 MG EC tablet Commonly known as:  DULCOLAX Take 1 tablet (5 mg total) by mouth daily as needed for moderate constipation.   carbidopa-levodopa 25-100 MG tablet Commonly known as:  SINEMET IR take 1 tablet by mouth twice a day   Cranberry 450 MG Caps Take 1 capsule by mouth 2 (two) times daily.   cyanocobalamin 1000 MCG/ML injection Commonly known as:  (VITAMIN B-12) Inject 1 mL (1,000 mcg total) into the muscle every 30 (thirty) days.   docusate sodium 100 MG capsule Commonly known as:  COLACE Take 1 capsule (100 mg total) by mouth 2 (two) times daily.   feeding supplement (ENSURE ENLIVE) Liqd Take 237 mLs by mouth 2 (two) times daily between meals.   memantine 5 MG tablet Commonly known as:  NAMENDA Take 1 tablet (5 mg total) by mouth 2 (two) times daily.   mirtazapine 30 MG tablet Commonly known as:  REMERON Take 1 tablet (30 mg total) by mouth at bedtime.   traMADol 50 MG tablet Commonly known as:  ULTRAM Take 25 mg by mouth every 6 (six) hours as needed for moderate  pain.   VENELEX Oint Apply 1 application topically See admin instructions. Apply to sacrum and bilateral buttocks q shift and prn      Follow-up Information    DAHLSTEDT, Bertram MillardSTEPHEN M, MD In 2 weeks.   Specialty:  Urology Why:  Evaluation of recurrent UTIs Contact information: [address] [place] [address] [phone]          I will follow the patient in the nursing home    Signed: Fuller CanadaStanley Calyn Rubi 08/01/2016, 9:38 AM

## 2016-08-02 ENCOUNTER — Ambulatory Visit: Payer: BLUE CROSS/BLUE SHIELD | Admitting: Orthopedic Surgery

## 2016-08-02 DIAGNOSIS — D649 Anemia, unspecified: Secondary | ICD-10-CM | POA: Diagnosis not present

## 2016-08-02 DIAGNOSIS — M9702XA Periprosthetic fracture around internal prosthetic left hip joint, initial encounter: Secondary | ICD-10-CM | POA: Diagnosis not present

## 2016-08-02 DIAGNOSIS — F411 Generalized anxiety disorder: Secondary | ICD-10-CM | POA: Diagnosis not present

## 2016-08-02 DIAGNOSIS — M6281 Muscle weakness (generalized): Secondary | ICD-10-CM | POA: Diagnosis not present

## 2016-08-02 DIAGNOSIS — I82402 Acute embolism and thrombosis of unspecified deep veins of left lower extremity: Secondary | ICD-10-CM | POA: Diagnosis not present

## 2016-08-02 DIAGNOSIS — R278 Other lack of coordination: Secondary | ICD-10-CM | POA: Diagnosis not present

## 2016-08-02 DIAGNOSIS — R413 Other amnesia: Secondary | ICD-10-CM | POA: Diagnosis not present

## 2016-08-02 DIAGNOSIS — M9702XD Periprosthetic fracture around internal prosthetic left hip joint, subsequent encounter: Secondary | ICD-10-CM | POA: Diagnosis not present

## 2016-08-02 DIAGNOSIS — D72829 Elevated white blood cell count, unspecified: Secondary | ICD-10-CM | POA: Diagnosis not present

## 2016-08-02 DIAGNOSIS — R600 Localized edema: Secondary | ICD-10-CM | POA: Diagnosis not present

## 2016-08-02 DIAGNOSIS — D519 Vitamin B12 deficiency anemia, unspecified: Secondary | ICD-10-CM | POA: Diagnosis not present

## 2016-08-02 DIAGNOSIS — M7989 Other specified soft tissue disorders: Secondary | ICD-10-CM | POA: Diagnosis not present

## 2016-08-02 DIAGNOSIS — R262 Difficulty in walking, not elsewhere classified: Secondary | ICD-10-CM | POA: Diagnosis not present

## 2016-08-02 DIAGNOSIS — I824Y2 Acute embolism and thrombosis of unspecified deep veins of left proximal lower extremity: Secondary | ICD-10-CM | POA: Diagnosis not present

## 2016-08-02 DIAGNOSIS — Z9181 History of falling: Secondary | ICD-10-CM | POA: Diagnosis not present

## 2016-08-02 DIAGNOSIS — G20 Parkinson's disease: Secondary | ICD-10-CM | POA: Diagnosis not present

## 2016-08-02 DIAGNOSIS — I824Z2 Acute embolism and thrombosis of unspecified deep veins of left distal lower extremity: Secondary | ICD-10-CM | POA: Diagnosis not present

## 2016-08-02 DIAGNOSIS — F0280 Dementia in other diseases classified elsewhere without behavioral disturbance: Secondary | ICD-10-CM | POA: Diagnosis not present

## 2016-08-02 DIAGNOSIS — S72002D Fracture of unspecified part of neck of left femur, subsequent encounter for closed fracture with routine healing: Secondary | ICD-10-CM | POA: Diagnosis not present

## 2016-08-02 LAB — HEMOGLOBIN AND HEMATOCRIT, BLOOD
HCT: 30.6 % — ABNORMAL LOW (ref 36.0–46.0)
HEMOGLOBIN: 10.3 g/dL — AB (ref 12.0–15.0)

## 2016-08-02 MED ORDER — ENSURE ENLIVE PO LIQD: 237.0000 mL | Freq: Two times a day (BID) | ORAL | 12 refills | Status: DC

## 2016-08-02 MED ORDER — BISACODYL 5 MG PO TBEC: 5.0000 mg | DELAYED_RELEASE_TABLET | Freq: Every day | ORAL | 0 refills | Status: AC | PRN

## 2016-08-02 NOTE — Discharge Instructions (Signed)
Keep the abduction pillow in place until April 7  Therapy wbat  Direct lateral approach precautions

## 2016-08-02 NOTE — Progress Notes (Signed)
Report called to Renae at the [facility].  Answered all questions at this time.  [facility]

## 2016-08-02 NOTE — Care Management Important Message (Signed)
Important Message  Patient Details  Name: [name] Oliver MRN: [id number] Date of Birth: 06/23/1932   Medicare Important Message Given:  Yes    Malcolm MetroChildress, Vir Whetstine Demske, RN 08/02/2016, 9:09 AM

## 2016-08-02 NOTE — Clinical Social Work Note (Signed)
[facility], daughter at bedside, and facility aware and agreeable. Will transfer with staff.  Derenda FennelKara Lonnetta Kniskern, LCSW [phone]

## 2016-08-03 ENCOUNTER — Encounter: Payer: Self-pay | Admitting: Internal Medicine

## 2016-08-03 ENCOUNTER — Other Ambulatory Visit: Payer: Self-pay | Admitting: *Deleted

## 2016-08-03 ENCOUNTER — Non-Acute Institutional Stay (SKILLED_NURSING_FACILITY): Payer: Medicare Other | Admitting: Internal Medicine

## 2016-08-03 ENCOUNTER — Encounter (HOSPITAL_COMMUNITY)
Admission: RE | Admit: 2016-08-03 | Discharge: 2016-08-03 | Disposition: A | Discharge status: Home / Self Care | Payer: Medicare Other | Source: Skilled Nursing Facility | Attending: Internal Medicine | Admitting: Internal Medicine

## 2016-08-03 DIAGNOSIS — G20 Parkinson's disease: Secondary | ICD-10-CM | POA: Diagnosis not present

## 2016-08-03 DIAGNOSIS — M9702XA Periprosthetic fracture around internal prosthetic left hip joint, initial encounter: Secondary | ICD-10-CM

## 2016-08-03 DIAGNOSIS — F0280 Dementia in other diseases classified elsewhere without behavioral disturbance: Secondary | ICD-10-CM | POA: Diagnosis not present

## 2016-08-03 DIAGNOSIS — D649 Anemia, unspecified: Secondary | ICD-10-CM | POA: Diagnosis not present

## 2016-08-03 DIAGNOSIS — D72829 Elevated white blood cell count, unspecified: Secondary | ICD-10-CM

## 2016-08-03 LAB — BASIC METABOLIC PANEL
ANION GAP: 7 (ref 5–15)
BUN: 15 mg/dL (ref 6–20)
CALCIUM: 8.3 mg/dL — AB (ref 8.9–10.3)
CHLORIDE: 106 mmol/L (ref 101–111)
CO2: 25 mmol/L (ref 22–32)
Creatinine, Ser: 0.5 mg/dL (ref 0.44–1.00)
GFR calc non Af Amer: >60 mL/min (ref >60)
GLUCOSE: 93 mg/dL (ref 65–99)
POTASSIUM: 3.6 mmol/L (ref 3.5–5.1)
Sodium: 138 mmol/L (ref 135–145)

## 2016-08-03 LAB — CBC
HEMATOCRIT: 30.6 % — AB (ref 36.0–46.0)
HEMOGLOBIN: 10.1 g/dL — AB (ref 12.0–15.0)
MCH: 31.1 pg (ref 26.0–34.0)
MCHC: 33 g/dL (ref 30.0–36.0)
MCV: 94.2 fL (ref 78.0–100.0)
Platelets: 569 10*3/uL — ABNORMAL HIGH (ref 150–400)
RBC: 3.25 MIL/uL — AB (ref 3.87–5.11)
RDW: 13.8 % (ref 11.5–15.5)
WBC: 12.7 10*3/uL — AB (ref 4.0–10.5)

## 2016-08-03 MED ORDER — TRAMADOL HCL 50 MG PO TABS: ORAL_TABLET | ORAL | 0 refills | Status: DC

## 2016-08-03 NOTE — Progress Notes (Signed)
Provider:  Edmon Crape Location:    [facility] Room Number: 129/P Place of Service:  SNF (31)  PCP: Dwana Melena, MD Patient Care Team: Benita Stabile, MD as PCP - General (Internal Medicine)  Extended Emergency Contact Information Primary Emergency Contact: Armanda Heritage, [place] Home Phone: [phone] Relation: Daughter Secondary Emergency Contact: Kadar,David Address: [address]          [place], [place] [address] [place] Home Phone: [phone] Relation: Spouse  Code Status: DNR Goals of Care: Advanced Directive information Advanced Directives 08/03/2016  Does Patient Have a Medical Advance Directive? Yes  Type of Advance Directive Out of facility DNR (pink MOST or yellow form)  Does patient want to make changes to medical advance directive? No - Patient declined  Copy of Healthcare Power of Attorney in Chart? -  Would patient like information on creating a medical advance directive? No - Patient declined  Pre-existing out of facility DNR order (yellow form or pink MOST form) -      Chief Complaint  Patient presents with  . Acute Visit  Status post repair periprosthetic  fracture around  previous prosthetic hip joint  HPI: Patient is a 83 y.o. female seen today after [facility] facility after hospitalization for the above-stated repair. She was originally admitted here after sustaining a left hip fracture that was surgically repaired.  Subsequently she fell in the facility-and it was determined that she had a periprosthetic fracture around the internal prosthetic hip joint.  Subsequently earlier this week she had surgery to correct this.  She had removal of the left hip prosthesis she had repair of  Trochanteric  Fractures--and a reinsertion of longer stem prosthesis.  She also received a transfusion for hemoglobin of 8.  This appears to have stabilized now with a hemoglobin of 10.1 on lab  done today.  She also was receiving treatment for UTI with Cipro.  Her white count today appears to be down at 12,700.  Currently she is afebrile.  She is receiving aspirin 325 mg a day for prophylaxis DVT.  She does have when necessary Tylenol for pain as well as when necessary tramadol for moderate pain.  Regards to dementia she continues on Namenda 5 mg twice a day.   She also continues on Sinemet 25-100 twice a day for history of parkinsonian   Of note she does have follow-up with urology scheduled for evaluation of recurrent UTIs      Past Medical History:  Diagnosis Date  . Chronic cough   . Dementia   . Hip fracture (HCC)   . Memory difficulty 01/08/2014  . Pelvis fracture (HCC)   . Primary Parkinsonism [facility])    Past Surgical History:  Procedure Laterality Date  . ABDOMINAL HYSTERECTOMY    . ABDOMINAL HYSTERECTOMY    . BREAST LUMPECTOMY Right   . CATARACT EXTRACTION Bilateral    03/14/13  . FRACTURE SURGERY    . HIP ARTHROPLASTY Left 07/15/2016   Procedure: BIPOLAR REPLACEMENT LEFT HIP;  Surgeon: Vickki Hearing, MD;  Location: [facility];  Service: Orthopedics;  Laterality: Left;  . JOINT REPLACEMENT    . ORIF HIP FRACTURE Left 07/28/2016   Procedure: Revision of bipolar hip replacement into unipolar hip replacement;  Surgeon: Vickki Hearing, MD;  Location: [facility];  Service: Orthopedics;  Laterality: Left;  . TOTAL HIP ARTHROPLASTY Right     reports that  she has never smoked. She has never used smokeless tobacco. She reports that she does not drink alcohol or use drugs. Social History   Social History  . Marital status: Married    Spouse name: N/A  . Number of children: 2  . Years of education: BA   Occupational History  . Retired Retired  . RN    Social History Main Topics  . Smoking status: Never Smoker  . Smokeless tobacco: Never Used  . Alcohol use No  . Drug use: No  . Sexual activity: Not Currently   Other Topics Concern  . Not on file     Social History Narrative   Lives at home with her husband.   Patient is right handed.   Patient drinks 1 cup of caffeine daily.       Functional Status Survey:    Family History  Problem Relation Age of Onset  . Dementia Mother   . Bipolar disorder Sister   . Parkinsonism Sister   . Diabetes Other     Health Maintenance  Topic Date Due  . TETANUS/TDAP  08/08/1951  . DEXA SCAN  08/09/1999  . INFLUENZA VACCINE  05/07/2017 (Originally 01/06/2016)  . PNA vac Low Risk Adult (1 of 2 - PCV13) 05/07/2017 (Originally 08/09/1999)    Allergies  Allergen Reactions  . Morphine And Related Other (See Comments)    Hallucinations  . Prednisone Other (See Comments)    manic  . Namzaric [Memantine Hcl-Donepezil Hcl] Anxiety    Allergies as of 08/03/2016      Reactions   Morphine And Related Other (See Comments)   Hallucinations   Prednisone Other (See Comments)   manic   Namzaric [memantine Hcl-donepezil Hcl] Anxiety      Medication List       Accurate as of 08/03/16 11:59 PM. Always use your most recent med list.          acetaminophen 500 MG tablet Commonly known as:  TYLENOL Take 1 tablet (500 mg total) by mouth every 6 (six) hours.   aspirin 325 MG EC tablet Take 1 tablet (325 mg total) by mouth daily with breakfast.   bisacodyl 5 MG EC tablet Commonly known as:  DULCOLAX Take 1 tablet (5 mg total) by mouth daily as needed for moderate constipation.   carbidopa-levodopa 25-100 MG tablet Commonly known as:  SINEMET IR take 1 tablet by mouth twice a day   Cranberry 450 MG Caps Take 1 capsule by mouth 2 (two) times daily.   cyanocobalamin 1000 MCG/ML injection Commonly known as:  (VITAMIN B-12) Inject 1 mL (1,000 mcg total) into the muscle every 30 (thirty) days.   docusate sodium 100 MG capsule Commonly known as:  COLACE Take 1 capsule (100 mg total) by mouth 2 (two) times daily.   feeding supplement (ENSURE ENLIVE) Liqd Take 237 mLs by mouth 2 (two) times  daily between meals.   memantine 5 MG tablet Commonly known as:  NAMENDA Take 1 tablet (5 mg total) by mouth 2 (two) times daily.   mirtazapine 30 MG tablet Commonly known as:  REMERON Take 1 tablet (30 mg total) by mouth at bedtime.   traMADol 50 MG tablet Commonly known as:  ULTRAM Take 1/2 tablet by mouth every 6 hours as needed for moderate pain not relieved with Tylenol.   VENELEX Oint Apply 1 application topically See admin instructions. Apply to sacrum and bilateral buttocks q shift and prn       Review of  Systems  This is somewhat limited since patient is a  poor historian.  Gen. she is not complaining of fever chills .  Skin is not complaining of rashes or itching.  Eyes is not complaining of visual changes.  Here nose mouth and throat does not really complaining of any sore throat.  Cardiac does not complaining of chest pain she does have lower extremity edema.On the left which is not new  Resp-t is not complaining of increased shortness of breath or cough.  GI is not really complaining of abdominal discomfort nausea or vomiting.  GU is status post treatment for UTI  Muscle skeletal is complaining of some left-sided leg and hip pain at times per nursing staff  Neurologic is not complaining of dizziness headache or numbness.  Psych--continues to have some confusion. Vitals:   08/03/16 1408  BP: 116/70  Pulse: 90  Resp: 20  Temp: 98.2 F (36.8 C)  TempSrc: Oral    Physical Exam In general this is a pleasant elderly female in no acutedistress resting in bed she is frail and somewhat confused  Her skin is warm and dry she does have covering over her left hip surgical site I do not see any surrounding erythema or evidence of drainage through the dressing.  Eyes pupils appear reactive to light sclera injected are clear visual acuity appears grossly intact.  Oropharynx clear mucous membranes moist.  Chest is clear to auscultation  there is no labored breathing.  Heart is regular rate and rhythm with an occasional irregular beat without murmur gallop or rub she does have edema of both legs pedal pulses appear to be intact and palpable pedal pulses--edema  greater on the left versus the right  Abdomen is soft nontender there are are positive bowel sounds.  GU could not really appreciate suprapubic distention or acute tenderness here.  Muscleskeletal does move her upper extremities has significant weakness lower extremities Currently  has a bed splint applied to separate  lower extremities  Neurologic is grossly intact her speech is clear do not really appreciate lateralizing findings.  Psych she is pleasant and appropriate is able to name the president the [place] year--but said the month was April-appeared able to give me accurately how many years she's been married an her home address Labs reviewed: Basic Metabolic Panel:  Recent Labs  40/98/11 0541 07/30/16 0521 08/03/16 0600  NA 135 138 138  K 4.0 3.5 3.6  CL 106 109 106  CO2 23 24 25   GLUCOSE 127* 117* 93  BUN 21* 17 15  CREATININE 0.64 0.48 0.50  CALCIUM 8.0* 8.0* 8.3*   Liver Function Tests:  Recent Labs  07/22/16 0830 07/25/16 1130 07/26/16 0700  AST 73* 71* 48*  ALT 14 33 14  ALKPHOS 209* 281* 261*  BILITOT 0.8 0.3 0.6  PROT 6.2* 5.6* 5.5*  ALBUMIN 3.2* 2.7* 2.7*   No results for input(s): LIPASE, AMYLASE in the last 8760 hours. No results for input(s): AMMONIA in the last 8760 hours. CBC:  Recent Labs  07/23/16 1457 07/25/16 1130  07/31/16 0717 08/01/16 1129 08/02/16 0535 08/03/16 0600  WBC 13.9* 13.4*  < > 14.2* 15.5*  --  12.7*  NEUTROABS 10.7* 10.3*  --   --  13.0*  --   --   HGB 10.5* 9.6*  < > 8.0* 10.6* 10.3* 10.1*  HCT 31.4* 28.9*  < > 24.0* 31.4* 30.6* 30.6*  MCV 98.4 99.0  < > 94.9 92.4  --  94.2  PLT 503* 540*  < > 516* 603*  --  569*  < > = values in this interval not displayed. Cardiac  Enzymes:  Recent Labs  07/23/16 1457  TROPONINI 0.03*   BNP: Invalid input(s): POCBNP No results found for: HGBA1C Lab Results  Component Value Date   TSH 6.192 (H) 07/20/2016   Lab Results  Component Value Date   VITAMINB12 264 09/21/2013   No results found for: FOLATE No results found for: IRON, TIBC, FERRITIN  Imaging and Procedures obtained prior to SNF admission: No results found.  Assessment/Plan  #1 history of left hip periprosthetic fracture-again this was surgically repaired as noted above she is followed by Dr. Perlie MayoHarrison-she is on aspirin 325 mg daily for DVT prophylaxis-she does not do well with strong pain medication she does have an order for low-dose tramadol 25 mg every 6 hours when necessary as well as when necessary Tylenol-at this point will monitor try to minimize use of tramadol although again we do not want her to be in pain either.  #2 history of UTI-she's completed a course of Cipro white count has trended down-she will have urology follow-up for recurrent UTIs.--Will update CBC later this week  #3history of anemia hemoglobin appears to be rising at 10.1 this appears to have risen will recheck this later this week.  #4-history of dementia-at this point continue supportive care--she is on Namenda-will order psychiatric consult also secondary to increased confusion. #5- History of Parkinson's continues on Sinemet continue supportive care  Again will update a CBC with differential and BMP later this week to keep an eye on her renal function white count in hemoglobin.  [id number]--of note greater than 35 minutes spent assessing patient-discussing her status with staff-reviewing her chart-reviewing her labs-and coordinating and formulating plan of care-of note greater than 50% of time spent coordinating plan of care

## 2016-08-03 NOTE — Telephone Encounter (Signed)
[facility] #[phone] Fax: [phone]   

## 2016-08-04 LAB — TYPE AND SCREEN
ABO/RH(D): A NEG
ANTIBODY SCREEN: NEGATIVE
Unit division: 0
Unit division: 0
Unit division: 0

## 2016-08-04 LAB — BPAM RBC
BLOOD PRODUCT EXPIRATION DATE: 201803132359
BLOOD PRODUCT EXPIRATION DATE: 201803192359
Blood Product Expiration Date: 201803222359
ISSUE DATE / TIME: 201802241513
ISSUE DATE / TIME: 201802270457
UNIT TYPE AND RH: 600
Unit Type and Rh: 9500
Unit Type and Rh: 9500

## 2016-08-05 ENCOUNTER — Encounter (HOSPITAL_COMMUNITY)
Admission: AD | Admit: 2016-08-05 | Discharge: 2016-08-05 | Disposition: A | Discharge status: Home / Self Care | Payer: Medicare Other | Source: Skilled Nursing Facility | Attending: Internal Medicine | Admitting: Internal Medicine

## 2016-08-05 DIAGNOSIS — N390 Urinary tract infection, site not specified: Secondary | ICD-10-CM | POA: Insufficient documentation

## 2016-08-05 LAB — CBC WITH DIFFERENTIAL/PLATELET
BASOS PCT: 0 %
Basophils Absolute: 0 10*3/uL (ref 0.0–0.1)
EOS ABS: 0.5 10*3/uL (ref 0.0–0.7)
EOS PCT: 4 %
HCT: 33 % — ABNORMAL LOW (ref 36.0–46.0)
HEMOGLOBIN: 10.8 g/dL — AB (ref 12.0–15.0)
Lymphocytes Relative: 13 %
Lymphs Abs: 1.6 10*3/uL (ref 0.7–4.0)
MCH: 31.1 pg (ref 26.0–34.0)
MCHC: 32.7 g/dL (ref 30.0–36.0)
MCV: 95.1 fL (ref 78.0–100.0)
Monocytes Absolute: 1.1 10*3/uL — ABNORMAL HIGH (ref 0.1–1.0)
Monocytes Relative: 9 %
Neutro Abs: 9.3 10*3/uL — ABNORMAL HIGH (ref 1.7–7.7)
Neutrophils Relative %: 74 %
PLATELETS: 595 10*3/uL — AB (ref 150–400)
RBC: 3.47 MIL/uL — AB (ref 3.87–5.11)
RDW: 13.6 % (ref 11.5–15.5)
WBC: 12.5 10*3/uL — AB (ref 4.0–10.5)

## 2016-08-05 LAB — URINALYSIS, ROUTINE W REFLEX MICROSCOPIC
BILIRUBIN URINE: NEGATIVE
Glucose, UA: NEGATIVE mg/dL
Hgb urine dipstick: NEGATIVE
KETONES UR: 5 mg/dL — AB
Leukocytes, UA: NEGATIVE
NITRITE: NEGATIVE
PROTEIN: NEGATIVE mg/dL
SPECIFIC GRAVITY, URINE: 1.01 (ref 1.005–1.030)
pH: 7 (ref 5.0–8.0)

## 2016-08-05 LAB — BASIC METABOLIC PANEL
Anion gap: 6 (ref 5–15)
BUN: 12 mg/dL (ref 6–20)
CHLORIDE: 104 mmol/L (ref 101–111)
CO2: 29 mmol/L (ref 22–32)
CREATININE: 0.55 mg/dL (ref 0.44–1.00)
Calcium: 8.5 mg/dL — ABNORMAL LOW (ref 8.9–10.3)
Glucose, Bld: 109 mg/dL — ABNORMAL HIGH (ref 65–99)
Potassium: 3.7 mmol/L (ref 3.5–5.1)
SODIUM: 139 mmol/L (ref 135–145)

## 2016-08-06 ENCOUNTER — Telehealth: Payer: Self-pay | Admitting: Orthopedic Surgery

## 2016-08-06 NOTE — Telephone Encounter (Signed)
Patient's daughter requests to speak with nurse, states has questions/concerns. Patient has had 2 recent hip surgeries by Dr Romeo AppleHarrison.  Her phone# is [phone]

## 2016-08-06 NOTE — Telephone Encounter (Signed)
Spoke with patient's daughter, and she wanted to let you know that she is concerned patient is getting a drop foot on the left. She states that she has made them aware at [facility]. I advised that you were out of the office until 08/16/16. Please advise

## 2016-08-07 LAB — URINE CULTURE: CULTURE: NO GROWTH

## 2016-08-08 ENCOUNTER — Non-Acute Institutional Stay (SKILLED_NURSING_FACILITY): Payer: Medicare Other | Admitting: Internal Medicine

## 2016-08-08 DIAGNOSIS — F0280 Dementia in other diseases classified elsewhere without behavioral disturbance: Secondary | ICD-10-CM | POA: Diagnosis not present

## 2016-08-08 DIAGNOSIS — S72002D Fracture of unspecified part of neck of left femur, subsequent encounter for closed fracture with routine healing: Secondary | ICD-10-CM | POA: Diagnosis not present

## 2016-08-08 DIAGNOSIS — G20 Parkinson's disease: Secondary | ICD-10-CM

## 2016-08-08 NOTE — Progress Notes (Signed)
08/08/16  Facility; [facility]  Chief complaint; medical review post admit to the facility with a left hip fracture and then subsequently a left lesser trochanter fracture. Both undergoing surgical repair  History; this was a patient initially admitted to the facility with a left hip fracture undergoing surgical repair. She suffered a fall in the facility and suffered a left femur, periprosthetic fracture also surgically repaired. I note that she was seen recently by our service from suggestion of increasing confusion. Lab work done at that time seems to be mostly unremarkable. Urine culture on 3/1 showed no growth  BMP Latest Ref Rng & Units 08/05/2016 08/03/2016 07/30/2016  Glucose 65 - 99 mg/dL 161(W109(H) 93 960(A117(H)  BUN 6 - 20 mg/dL 12 15 17   Creatinine 0.44 - 1.00 mg/dL 5.400.55 9.810.50 1.910.48  Sodium 135 - 145 mmol/L 139 138 138  Potassium 3.5 - 5.1 mmol/L 3.7 3.6 3.5  Chloride 101 - 111 mmol/L 104 106 109  CO2 22 - 32 mmol/L 29 25 24   Calcium 8.9 - 10.3 mg/dL 4.7(W8.5(L) 8.3(L) 8.0(L)   CBC Latest Ref Rng & Units 08/05/2016 08/03/2016 08/02/2016  WBC 4.0 - 10.5 K/uL 12.5(H) 12.7(H) -  Hemoglobin 12.0 - 15.0 g/dL 10.8(L) 10.1(L) 10.3(L)  Hematocrit 36.0 - 46.0 % 33.0(L) 30.6(L) 30.6(L)  Platelets 150 - 400 K/uL 595(H) 569(H) -   Current Outpatient Prescriptions on File Prior to Visit  Medication Sig Dispense Refill  . acetaminophen (TYLENOL) 500 MG tablet Take 1 tablet (500 mg total) by mouth every 6 (six) hours. 30 tablet 0  . aspirin EC 325 MG EC tablet Take 1 tablet (325 mg total) by mouth daily with breakfast. 30 tablet 0  . Balsam [place]-Castor Oil (VENELEX) OINT Apply 1 application topically See admin instructions. Apply to sacrum and bilateral buttocks q shift and prn     . bisacodyl (DULCOLAX) 5 MG EC tablet Take 1 tablet (5 mg total) by mouth daily as needed for moderate constipation. 30 tablet 0  . carbidopa-levodopa (SINEMET IR) 25-100 MG tablet take 1 tablet by mouth twice a day 60 tablet 11  .  Cranberry 450 MG CAPS Take 1 capsule by mouth 2 (two) times daily.    . cyanocobalamin (,VITAMIN B-12,) 1000 MCG/ML injection Inject 1 mL (1,000 mcg total) into the muscle every 30 (thirty) days. 10 mL 3  . docusate sodium (COLACE) 100 MG capsule Take 1 capsule (100 mg total) by mouth 2 (two) times daily. 10 capsule 0  . feeding supplement, ENSURE ENLIVE, (ENSURE ENLIVE) LIQD Take 237 mLs by mouth 2 (two) times daily between meals. 237 mL 12  . memantine (NAMENDA) 5 MG tablet Take 1 tablet (5 mg total) by mouth 2 (two) times daily. 60 tablet 5  . mirtazapine (REMERON) 30 MG tablet Take 1 tablet (30 mg total) by mouth at bedtime. 30 tablet 11  . traMADol (ULTRAM) 50 MG tablet Take 1/2 tablet by mouth every 6 hours as needed for moderate pain not relieved with Tylenol. 60 tablet 0     Review of systems; Gen; no weight loss HEENT no headache Respiratory; no shortness of breath, no cough, Cardiac; no exertional chest pain, no palpitations GI; no abnormal pain, no change in bowel habits GU; no dysuria, no voiding difficulties Musculoskeletal no real complaints of pain while sitting in the chair   Physical examination Gen. patient is not in any distress sitting up eating her breakfast. HEENT no oral lesions are seen Respiratory clear entry bilaterally Cardiac  heart sounds are normal. She does not appear to be dehydrated Abdomen soft nontender no masses. GU no suprapubic or CVA tenderness. Extremities; minimal edema, no evidence of a DVT Mental status; patient was alert and conversational. She seems aware that today was her birthday but told me she was born in [birth year] not [birth year] and that she thought the date was April. Her attention span with me was normal.  Impression/plan #1 left hip fracture surgically repaired then periprosthetic left femur fracture also surgically repaired. Patient is going to need evaluation for osteoporosis if she hasn't already had it. #2 concern re-increasing confusion. I  don't really have a sense of this. The concern in the postoperative state is always delirium although her last surgery was 07/26/16. I also note she is on Namenda, there is likely to be baseline cognitive impairment. #3 Parkinson's disease on Sinemet this seems reasonably well controlled. No need to alter current dosages.

## 2016-08-10 ENCOUNTER — Encounter (HOSPITAL_COMMUNITY)
Admission: AD | Admit: 2016-08-10 | Discharge: 2016-08-10 | Disposition: A | Discharge status: Home / Self Care | Payer: Medicare Other | Source: Skilled Nursing Facility | Attending: *Deleted | Admitting: *Deleted

## 2016-08-10 LAB — BASIC METABOLIC PANEL
Anion gap: 7 (ref 5–15)
BUN: 17 mg/dL (ref 6–20)
CALCIUM: 9.1 mg/dL (ref 8.9–10.3)
CO2: 29 mmol/L (ref 22–32)
Chloride: 104 mmol/L (ref 101–111)
Creatinine, Ser: 0.54 mg/dL (ref 0.44–1.00)
GFR calc Af Amer: >60 mL/min (ref >60)
GLUCOSE: 106 mg/dL — AB (ref 65–99)
Potassium: 3.8 mmol/L (ref 3.5–5.1)
SODIUM: 140 mmol/L (ref 135–145)

## 2016-08-10 LAB — CBC WITH DIFFERENTIAL/PLATELET
Basophils Absolute: 0 10*3/uL (ref 0.0–0.1)
Basophils Relative: 0 %
EOS ABS: 0.5 10*3/uL (ref 0.0–0.7)
Eosinophils Relative: 5 %
HCT: 35.7 % — ABNORMAL LOW (ref 36.0–46.0)
Hemoglobin: 11.5 g/dL — ABNORMAL LOW (ref 12.0–15.0)
LYMPHS ABS: 2.2 10*3/uL (ref 0.7–4.0)
LYMPHS PCT: 21 %
MCH: 31 pg (ref 26.0–34.0)
MCHC: 32.2 g/dL (ref 30.0–36.0)
MCV: 96.2 fL (ref 78.0–100.0)
MONO ABS: 0.8 10*3/uL (ref 0.1–1.0)
Monocytes Relative: 8 %
Neutro Abs: 6.7 10*3/uL (ref 1.7–7.7)
Neutrophils Relative %: 66 %
Platelets: 556 10*3/uL — ABNORMAL HIGH (ref 150–400)
RBC: 3.71 MIL/uL — ABNORMAL LOW (ref 3.87–5.11)
RDW: 13.8 % (ref 11.5–15.5)
WBC: 10.2 10*3/uL (ref 4.0–10.5)

## 2016-08-11 ENCOUNTER — Ambulatory Visit (HOSPITAL_COMMUNITY)
Admission: RE | Admit: 2016-08-11 | Discharge: 2016-08-11 | Disposition: A | Discharge status: Home / Self Care | Payer: No Typology Code available for payment source | Source: Ambulatory Visit | Attending: Internal Medicine | Admitting: Internal Medicine

## 2016-08-11 ENCOUNTER — Other Ambulatory Visit: Payer: Self-pay | Admitting: Internal Medicine

## 2016-08-11 ENCOUNTER — Encounter: Payer: Self-pay | Admitting: Internal Medicine

## 2016-08-11 ENCOUNTER — Non-Acute Institutional Stay (SKILLED_NURSING_FACILITY): Payer: Medicare Other | Admitting: Internal Medicine

## 2016-08-11 DIAGNOSIS — I824Z2 Acute embolism and thrombosis of unspecified deep veins of left distal lower extremity: Secondary | ICD-10-CM | POA: Diagnosis not present

## 2016-08-11 DIAGNOSIS — I824Y2 Acute embolism and thrombosis of unspecified deep veins of left proximal lower extremity: Secondary | ICD-10-CM | POA: Diagnosis not present

## 2016-08-11 DIAGNOSIS — R600 Localized edema: Secondary | ICD-10-CM

## 2016-08-11 DIAGNOSIS — M7989 Other specified soft tissue disorders: Secondary | ICD-10-CM | POA: Diagnosis not present

## 2016-08-11 NOTE — Progress Notes (Signed)
Location:   [facility] Room Number: 129/P Place of Service:  SNF [phone]) Provider:  Phylis Bougie, MD  Patient Care Team: Benita Stabile, MD as PCP - General (Internal Medicine)  Extended Emergency Contact Information Primary Emergency Contact: Armanda Heritage, [place] Home Phone: [phone] Relation: Daughter Secondary Emergency Contact: Runk,David Address: [address]          [place], [place] [address] [place] Home Phone: [phone] Relation: Spouse  Code Status:  DNR Goals of care: Advanced Directive information Advanced Directives 08/11/2016  Does Patient Have a Medical Advance Directive? Yes  Type of Advance Directive Out of facility DNR (pink MOST or yellow form)  Does patient want to make changes to medical advance directive? No - Patient declined  Copy of Healthcare Power of Attorney in Chart? -  Would patient like information on creating a medical advance directive? No - Patient declined  Pre-existing out of facility DNR order (yellow form or pink MOST form) -     Chief Complaint  Patient presents with  . Acute Visit  Secondary to left leg edema  HPI:  Pt is a 83 y.o. female seen today for an acute visit for onset of left leg edema.  She was initially made a facility with a left hip fracture after undergoing surgical repair she sustained a fall the facility and sustained a left femur per prosthetic fracture that was also surgically repaired.  Apparently today it was noted that she had increased edema over left lower extremity-she's not really complaining of acute pain here her vital signs are stable.  She is on aspirin 325 mg a day for DVT prophylaxis       Past Medical History:  Diagnosis Date  . Chronic cough   . Dementia   . Hip fracture (HCC)   . Memory difficulty 01/08/2014  . Pelvis fracture (HCC)   . Primary Parkinsonism [facility])    Past Surgical History:    Procedure Laterality Date  . ABDOMINAL HYSTERECTOMY    . ABDOMINAL HYSTERECTOMY    . BREAST LUMPECTOMY Right   . CATARACT EXTRACTION Bilateral    03/14/13  . FRACTURE SURGERY    . HIP ARTHROPLASTY Left 07/15/2016   Procedure: BIPOLAR REPLACEMENT LEFT HIP;  Surgeon: Vickki Hearing, MD;  Location: [facility];  Service: Orthopedics;  Laterality: Left;  . JOINT REPLACEMENT    . ORIF HIP FRACTURE Left 07/28/2016   Procedure: Revision of bipolar hip replacement into unipolar hip replacement;  Surgeon: Vickki Hearing, MD;  Location: [facility];  Service: Orthopedics;  Laterality: Left;  . TOTAL HIP ARTHROPLASTY Right     Allergies  Allergen Reactions  . Morphine And Related Other (See Comments)    Hallucinations  . Prednisone Other (See Comments)    manic  . Namzaric [Memantine Hcl-Donepezil Hcl] Anxiety    Allergies as of 08/11/2016      Reactions   Morphine And Related Other (See Comments)   Hallucinations   Prednisone Other (See Comments)   manic   Namzaric [memantine Hcl-donepezil Hcl] Anxiety      Medication List       Accurate as of 08/11/16  2:30 PM. Always use your most recent med list.          acetaminophen 500 MG tablet Commonly known as:  TYLENOL Take 1 tablet (500 mg total) by mouth every 6 (  six) hours.   aspirin 325 MG EC tablet Take 1 tablet (325 mg total) by mouth daily with breakfast.   bisacodyl 5 MG EC tablet Commonly known as:  DULCOLAX Take 1 tablet (5 mg total) by mouth daily as needed for moderate constipation.   carbidopa-levodopa 25-100 MG tablet Commonly known as:  SINEMET IR take 1 tablet by mouth twice a day   Cranberry 450 MG Caps Take 1 capsule by mouth 2 (two) times daily.   cyanocobalamin 1000 MCG/ML injection Commonly known as:  (VITAMIN B-12) Inject 1 mL (1,000 mcg total) into the muscle every 30 (thirty) days.   docusate sodium 100 MG capsule Commonly known as:  COLACE Take 1 capsule (100 mg total) by mouth 2 (two) times daily.    feeding supplement (ENSURE ENLIVE) Liqd Take 237 mLs by mouth 2 (two) times daily between meals.   memantine 5 MG tablet Commonly known as:  NAMENDA Take 1 tablet (5 mg total) by mouth 2 (two) times daily.   mirtazapine 30 MG tablet Commonly known as:  REMERON Take 1 tablet (30 mg total) by mouth at bedtime.   traMADol 50 MG tablet Commonly known as:  ULTRAM Take 1/2 tablet by mouth every 6 hours as needed for moderate pain not relieved with Tylenol.   VENELEX Oint Apply 1 application topically See admin instructions. Apply to sacrum and bilateral buttocks q shift and prn       Review of Systems   This is somewhat limited secondary to patient being a poor historian  General does not complaining of acute pain here or fever or chills.  Respiratory is not complaining of shortness of breath.  Cardiac does not complain of chest pain does have the edema noted most significant over left lower extremity.  GI is not really complaining of abdominal discomfort.  Musculoskeletal does not really complain at this point of pain she is sitting in her wheelchair.     There is no immunization history on file for this patient. Pertinent  Health Maintenance Due  Topic Date Due  . DEXA SCAN  08/09/1999  . INFLUENZA VACCINE  05/07/2017 (Originally 01/06/2016)  . PNA vac Low Risk Adult (1 of 2 - PCV13) 05/07/2017 (Originally 08/09/1999)   No flowsheet data found. Functional Status Survey:    Temperature 98.0 pulse 66 respirations 20 blood pressure 126/60  Physical Exam   In general this is a frail elderly female in no distress sitting comfortably in her wheelchair.  Her skin is warm and dry.  Chest is clear to auscultation there is no labored breathing.  Heart is regular rate and rhythm without murmur gallop or rub she does have 2+ edema of her left lower extremity with difficult to palpate pedal pulse although I was able to palpate 1.  She has trace right lower extremity  edema.  Abdomen soft nontender positive bowel sounds.  Musculoskeletal she does ambulate in a wheelchair has general frailty able to move all extremities again with limitations of her left lower extremity.  Neurologic appears grossly intact.  Psych she is oriented to self is pleasant and cooperative still has some confusion apparently this is more pronounced towards the evening.    Labs reviewed:  Recent Labs  08/03/16 0600 08/05/16 0700 08/10/16 0700  NA 138 139 140  K 3.6 3.7 3.8  CL 106 104 104  CO2 25 29 29   GLUCOSE 93 109* 106*  BUN 15 12 17   CREATININE 0.50 0.55 0.54  CALCIUM 8.3* 8.5*  9.1    Recent Labs  07/22/16 0830 07/25/16 1130 07/26/16 0700  AST 73* 71* 48*  ALT 14 33 14  ALKPHOS 209* 281* 261*  BILITOT 0.8 0.3 0.6  PROT 6.2* 5.6* 5.5*  ALBUMIN 3.2* 2.7* 2.7*    Recent Labs  08/01/16 1129  08/03/16 0600 08/05/16 0700 08/10/16 0700  WBC 15.5*  --  12.7* 12.5* 10.2  NEUTROABS 13.0*  --   --  9.3* 6.7  HGB 10.6*  < > 10.1* 10.8* 11.5*  HCT 31.4*  < > 30.6* 33.0* 35.7*  MCV 92.4  --  94.2 95.1 96.2  PLT 603*  --  569* 595* 556*  < > = values in this interval not displayed. Lab Results  Component Value Date   TSH 6.192 (H) 07/20/2016   No results found for: HGBA1C No results found for: CHOL, HDL, LDLCALC, LDLDIRECT, TRIG, CHOLHDL  Significant Diagnostic Results in last 30 days:  Dg Pelvis 1-2 Views  Result Date: 07/28/2016 CLINICAL DATA:  Postop left hip arthroplasty revision. EXAM: PELVIS - 1-2 VIEW COMPARISON:  07/15/2016. FINDINGS: Stable appearance of the left hip arthroplasty device. Fixation of the lesser trochanter of the proximal left femur has been performed with cerclage wires. The hardware components appear to be in anatomic alignment. IMPRESSION: 1. Status post fixation of a periprosthetic left lesser trochanter fracture. Electronically Signed   By: Signa Kell M.D.   On: 07/28/2016 18:01   Dg Pelvis Portable  Result Date:  07/15/2016 CLINICAL DATA:  Status post LEFT hip replacement. LEFT femoral neck fracture. EXAM: PORTABLE PELVIS 1-2 VIEWS COMPARISON:  07/14/2016. FINDINGS: Patient is status post open reduction internal fixation of a LEFT femoral neck fracture with a bipolar prosthesis. Satisfactory position and alignment. Remote RIGHT THA. IMPRESSION: No adverse features. Electronically Signed   By: Elsie Stain M.D.   On: 07/15/2016 16:34   Dg Chest Port 1 View  Result Date: 07/23/2016 CLINICAL DATA:  Fall. EXAM: PORTABLE CHEST 1 VIEW COMPARISON:  07/15/2006 FINDINGS: The heart size and mediastinal contours are within normal limits. Mild chronic pulmonary interstitial prominence. There is no evidence of pulmonary edema, consolidation, pneumothorax, nodule or pleural fluid. The visualized skeletal structures are unremarkable. IMPRESSION: No active disease. Electronically Signed   By: Irish Lack M.D.   On: 07/23/2016 15:08   Chest Portable 1 View  Result Date: 07/15/2016 CLINICAL DATA:  Left hip fracture after a fall. EXAM: PORTABLE CHEST 1 VIEW COMPARISON:  09/19/2013 FINDINGS: Shallow inspiration. Heart size and pulmonary vascularity are normal for technique. No focal airspace disease or consolidation in the lungs. No blunting of costophrenic angles. No pneumothorax. Calcified and tortuous aorta. IMPRESSION: Shallow inspiration.  No evidence of active pulmonary disease. Electronically Signed   By: Burman Nieves M.D.   On: 07/15/2016 01:36   Dg Hip Unilat With Pelvis 2-3 Views Left  Result Date: 07/28/2016 CLINICAL DATA:  Intraoperative imaging for fixation of a periprosthetic left hip fracture. Initial encounter. EXAM: DG HIP (WITH OR WITHOUT PELVIS) 2-3V LEFT COMPARISON:  Plain films left hip 07/27/2016. FINDINGS: Two intraoperative views of the left hip are provided. On the first image, the femoral head component of patient's left hip arthroplasty has been removed. Two fixation bands are in place about the  patient's lesser trochanter fracture. On the second image, the femoral head has been replaced. The device is located. No new abnormality. IMPRESSION: Fixation of a periprosthetic left lesser trochanter fracture as above. Electronically Signed   By: Drusilla Kanner M.D.  On: 07/28/2016 16:17   Dg Hip Unilat With Pelvis 2-3 Views Left  Result Date: 07/27/2016 CLINICAL DATA:  Left hip fracture. EXAM: DG HIP (WITH OR WITHOUT PELVIS) 2-3V LEFT COMPARISON:  07/23/2016 FINDINGS: Status post bilateral hip replacement. Frontal pelvis with AP and cross-table lateral views of the left hip again show the periprosthetic proximal left femur fracture lesser trochanter exists as a free fragment. Taking into account the different positioning between the 2 exams, likely no substantial movement of the fracture fragments. IMPRESSION: Periprosthetic proximal left femur fracture without substantial interval change. Electronically Signed   By: Kennith Center M.D.   On: 07/27/2016 10:39   Dg Hip Unilat W Or Wo Pelvis 2-3 Views Left  Result Date: 07/23/2016 CLINICAL DATA:  Status post left hip hemiarthroplasty for repair of left proximal femur fracture on 07/15/2016. Recurrent fall yesterday. Left hip pain. EXAM: DG HIP (WITH OR WITHOUT PELVIS) 2-3V LEFT COMPARISON:  07/15/2016 pelvic radiograph. FINDINGS: Re- demonstration of left hip hemiarthroplasty. No dislocation at the left hip joint. There is a new periprosthetic left proximal femoral metaphysis fracture with a new large 8 mm medially displaced butterfly fragment involving the lesser trochanter. No evidence of left proximal femoral prosthetic loosening. Skin staples lateral to the left hip. Soft tissue swelling lateral to the left hip. Stable healed deformity in the right pubic rami. No pelvic diastasis. Diffuse osteopenia. Prominent spondylosis in the visualized lower lumbar spine. No suspicious focal osseous lesions. Partially visualized right total hip arthroplasty.  IMPRESSION: 1. New periprosthetic left proximal femoral metaphysis fracture with a new large medially displaced butterfly fragment involving the lesser trochanter. 2. No hip dislocation. Electronically Signed   By: Delbert Phenix M.D.   On: 07/23/2016 14:12   Dg Hip Unilat W Or Wo Pelvis 2-3 Views Left  Result Date: 07/14/2016 CLINICAL DATA:  Hip pain with injury EXAM: DG HIP (WITH OR WITHOUT PELVIS) 2-3V LEFT COMPARISON:  None. FINDINGS: The patient is status post right hip replacement without acute dislocation. Apparent old right inferior pubic ramus fracture. Acute left femoral neck fracture with close to 1/2 bone with of superior displacement of distal fracture fragment. Left femoral head projects in joint. IMPRESSION: 1. Acute displaced left femoral neck fracture. Left femoral head projects in joint. 2. Status post right hip replacement without acute dislocation. Apparent old fracture deformity of the right inferior ramus. Electronically Signed   By: Jasmine Pang M.D.   On: 07/14/2016 23:01   Dg Femur Min 2 Views Left  Result Date: 07/28/2016 CLINICAL DATA:  Stat postop. EXAM: LEFT FEMUR 2 VIEWS COMPARISON:  Earlier today. FINDINGS: The periprosthetic lesser trochanter fracture involving the proximal left femur has been reduced with cerclage wires. Fracture fragments are in anatomic alignment. No new fracture or subluxation. IMPRESSION: Status post ORIF of periprosthetic lesser trochanter fracture of the proximal left femur. Electronically Signed   By: Signa Kell M.D.   On: 07/28/2016 18:05   Dg Femur Min 2 Views Left  Result Date: 07/14/2016 CLINICAL DATA:  Hip pain EXAM: LEFT FEMUR 2 VIEWS COMPARISON:  07/14/2016 radiographs FINDINGS: The distal femur is imaged. No fracture or malalignment of the distal femur. IMPRESSION: Negative.  Please see separately dictated left hip report Electronically Signed   By: Jasmine Pang M.D.   On: 07/14/2016 23:02    Assessment/Plan  1 left leg edema will  order a venous Doppler to rule out DVT.  She does not appear to be in any acute distress but certainly concerns with  the edema here for a clot will await Doppler studies.  Addendum we have obtained the results of the Doppler studies she does have an occlusive thrombus of the left peroneal vein-this was discussed with Dr. Leanord Hawkingobson and will start Eliquis 10 mg by mouth twice a day for 7 days and then reduce to 5 mg by mouth twice a day-this was discussed with her daughter via phone as well.  Will monitor her hemoglobin level early next week to ensure stability of this.  Clinically she appears to be stable.  Continue to monitor closely---  [phone]

## 2016-08-12 DIAGNOSIS — I82402 Acute embolism and thrombosis of unspecified deep veins of left lower extremity: Secondary | ICD-10-CM | POA: Insufficient documentation

## 2016-08-13 ENCOUNTER — Encounter (HOSPITAL_COMMUNITY)
Admission: RE | Admit: 2016-08-13 | Discharge: 2016-08-13 | Disposition: A | Discharge status: Home / Self Care | Payer: Medicare Other | Source: Skilled Nursing Facility | Attending: Internal Medicine | Admitting: Internal Medicine

## 2016-08-13 ENCOUNTER — Telehealth: Payer: Self-pay | Admitting: Neurology

## 2016-08-13 LAB — CBC WITH DIFFERENTIAL/PLATELET
BASOS PCT: 0 %
Basophils Absolute: 0 10*3/uL (ref 0.0–0.1)
Eosinophils Absolute: 0.4 10*3/uL (ref 0.0–0.7)
Eosinophils Relative: 4 %
HEMATOCRIT: 35.7 % — AB (ref 36.0–46.0)
Hemoglobin: 11.6 g/dL — ABNORMAL LOW (ref 12.0–15.0)
LYMPHS ABS: 2.4 10*3/uL (ref 0.7–4.0)
Lymphocytes Relative: 22 %
MCH: 31.1 pg (ref 26.0–34.0)
MCHC: 32.5 g/dL (ref 30.0–36.0)
MCV: 95.7 fL (ref 78.0–100.0)
MONO ABS: 1 10*3/uL (ref 0.1–1.0)
MONOS PCT: 9 %
NEUTROS ABS: 7 10*3/uL (ref 1.7–7.7)
Neutrophils Relative %: 65 %
Platelets: 514 10*3/uL — ABNORMAL HIGH (ref 150–400)
RBC: 3.73 MIL/uL — ABNORMAL LOW (ref 3.87–5.11)
RDW: 13.8 % (ref 11.5–15.5)
WBC: 10.8 10*3/uL — ABNORMAL HIGH (ref 4.0–10.5)

## 2016-08-13 NOTE — Telephone Encounter (Signed)
Patients daughter Tobi Bastosnna  called in reference to mother being in a rehab/nursing facility [facility] due to recent hip replacement.  Daughter has questions about medications.  Patients daughter would like to speak with Dr. Anne HahnWillis.  Please call

## 2016-08-13 NOTE — Telephone Encounter (Signed)
I called patient, talk with the daughter. The patient has apparent had a fall with a fractured hip, she has transitioned to a rehabilitation facility but fell again and fractured her hip again, she has had increased anxiety, worsening confusion with the dementia, and significant discomfort and pain.  The patient may require a long-acting preparation of Ultram for pain, may need to add low-dose clonazepam for anxiety. The patient desperately needs to be mobilized following the fractures to maintain mobility with Parkinson's disease.  This may represent a significant setback for the patient.

## 2016-08-17 ENCOUNTER — Encounter (HOSPITAL_COMMUNITY)
Admission: RE | Admit: 2016-08-17 | Discharge: 2016-08-17 | Disposition: A | Discharge status: Home / Self Care | Payer: Medicare Other | Source: Skilled Nursing Facility | Attending: Internal Medicine | Admitting: Internal Medicine

## 2016-08-17 LAB — BASIC METABOLIC PANEL
Anion gap: 7 (ref 5–15)
BUN: 15 mg/dL (ref 6–20)
CO2: 30 mmol/L (ref 22–32)
Calcium: 8.9 mg/dL (ref 8.9–10.3)
Chloride: 103 mmol/L (ref 101–111)
Creatinine, Ser: 0.63 mg/dL (ref 0.44–1.00)
Glucose, Bld: 99 mg/dL (ref 65–99)
POTASSIUM: 3.9 mmol/L (ref 3.5–5.1)
SODIUM: 140 mmol/L (ref 135–145)

## 2016-08-17 LAB — CBC WITH DIFFERENTIAL/PLATELET
BASOS ABS: 0 10*3/uL (ref 0.0–0.1)
BASOS PCT: 0 %
EOS ABS: 0.3 10*3/uL (ref 0.0–0.7)
EOS PCT: 3 %
HCT: 33.7 % — ABNORMAL LOW (ref 36.0–46.0)
HEMOGLOBIN: 10.9 g/dL — AB (ref 12.0–15.0)
LYMPHS ABS: 2 10*3/uL (ref 0.7–4.0)
Lymphocytes Relative: 23 %
MCH: 30.9 pg (ref 26.0–34.0)
MCHC: 32.3 g/dL (ref 30.0–36.0)
MCV: 95.5 fL (ref 78.0–100.0)
Monocytes Absolute: 0.8 10*3/uL (ref 0.1–1.0)
Monocytes Relative: 9 %
NEUTROS PCT: 65 %
Neutro Abs: 5.8 10*3/uL (ref 1.7–7.7)
PLATELETS: 429 10*3/uL — AB (ref 150–400)
RBC: 3.53 MIL/uL — AB (ref 3.87–5.11)
RDW: 14 % (ref 11.5–15.5)
WBC: 9 10*3/uL (ref 4.0–10.5)

## 2016-08-18 ENCOUNTER — Telehealth: Payer: Self-pay | Admitting: Orthopedic Surgery

## 2016-08-18 ENCOUNTER — Non-Acute Institutional Stay (SKILLED_NURSING_FACILITY): Payer: Medicare Other | Admitting: Internal Medicine

## 2016-08-18 DIAGNOSIS — G20 Parkinson's disease: Secondary | ICD-10-CM | POA: Diagnosis not present

## 2016-08-18 DIAGNOSIS — M9702XA Periprosthetic fracture around internal prosthetic left hip joint, initial encounter: Secondary | ICD-10-CM | POA: Diagnosis not present

## 2016-08-18 DIAGNOSIS — F0280 Dementia in other diseases classified elsewhere without behavioral disturbance: Secondary | ICD-10-CM

## 2016-08-18 DIAGNOSIS — R413 Other amnesia: Secondary | ICD-10-CM | POA: Diagnosis not present

## 2016-08-18 DIAGNOSIS — I82402 Acute embolism and thrombosis of unspecified deep veins of left lower extremity: Secondary | ICD-10-CM

## 2016-08-18 DIAGNOSIS — S72002D Fracture of unspecified part of neck of left femur, subsequent encounter for closed fracture with routine healing: Secondary | ICD-10-CM

## 2016-08-18 NOTE — Telephone Encounter (Signed)
[facility]; states patient still has staples in, post surgery on 07/15/16 and 07/21/16. Patient was in-patient hospital at time of scheduled appointment 08/04/16. Please advise regarding orders for removal of staples.  Dr Romeo AppleHarrison will be out of office until next week - please also advise of appointment.  Ph# [phone]/Fax# [phone]

## 2016-08-18 NOTE — Progress Notes (Signed)
This is a discharge note.  Level care skilled.  Facility is [facility].  Chief complaint-discharge note.  History of present illness.  Patient is a pleasant 83 year old female who is here for rehabilitation after sustaining a left hip fracture that was surgically repaired.  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  She was started on Eliquis 10 mg twice a day for a week she's now been reduced to 5 mg twice a day.  Apparently she will be transferred to another nursing facility tomorrow secondary to family concerns that she continue physical therapy apparently therapy has discharged her at this facility.  She also has a history of dementia with Parkinson's disease continues on Sinemet.  Initially when she came she had increased confusion her Xanax was discontinued as well as hydrocodone.  She was receiving Tylenol for pain apparently this was not totally effective and Ultram when necessary has been added to the routine Tylenol.  Apparently this has helped some.  She continues on Namenda 5 mg twice a day with a history of her dementia.  She is also on melatonin 3 mg at night for insomnia.  .  In regards to Parkinson's disease she continues on Sinemet twice a day.  Her vital signs appear to be stable in the facility she has been afebrile she is resting in bed comfortably tonight she does have a sitter now apparently secondary to concerns of a fall risk  Past Medical History:  Diagnosis Date  . Chronic cough   . Dementia   . Hip fracture (HCC)   . Memory difficulty 01/08/2014  . Pelvis fracture (HCC)   . Primary Parkinsonism [facility])         Past Surgical History:  Procedure Laterality Date  . ABDOMINAL HYSTERECTOMY    .  ABDOMINAL HYSTERECTOMY    . BREAST LUMPECTOMY Right   . CATARACT EXTRACTION Bilateral    03/14/13  . FRACTURE SURGERY    . HIP ARTHROPLASTY Left 07/15/2016   Procedure: BIPOLAR REPLACEMENT LEFT HIP;  Surgeon: Vickki Hearing, MD;  Location: [facility];  Service: Orthopedics;  Laterality: Left;  . JOINT REPLACEMENT    . ORIF HIP FRACTURE Left 07/28/2016   Procedure: Revision of bipolar hip replacement into unipolar hip replacement;  Surgeon: Vickki Hearing, MD;  Location: [facility];  Service: Orthopedics;  Laterality: Left;  . TOTAL HIP ARTHROPLASTY Right          Allergies  Allergen Reactions  . Morphine And Related Other (See Comments)    Hallucinations  . Prednisone Other (See Comments)    manic  . Namzaric [Memantine Hcl-Donepezil Hcl] Anxiety        Allergies as of 08/11/2016      Reactions   Morphine And Related Other (See Comments)   Hallucinations   Prednisone Other (See Comments)   manic   Namzaric [memantine Hcl-donepezil Hcl] Anxiety               Medication List           Accurate as of 08/11/16  2:30 PM. Always use your most recent med list.           acetaminophen 500 MG tablet Commonly known as:  TYLENOL Take 1  tablet (500 mg total) by mouth every 6 (six) hours.   aspirin 325 MG EC tablet Take 1 tablet (325 mg total) by mouth daily with breakfast.   bisacodyl 5 MG EC tablet Commonly known as:  DULCOLAX Take 1 tablet (5 mg total) by mouth daily as needed for moderate constipation.   carbidopa-levodopa 25-100 MG tablet Commonly known as:  SINEMET IR take 1 tablet by mouth twice a day   Cranberry 450 MG Caps Take 1 capsule by mouth 2 (two) times daily.   cyanocobalamin 1000 MCG/ML injection Commonly known as:  (VITAMIN B-12) Inject 1 mL (1,000 mcg total) into the muscle every 30 (thirty) days.   docusate sodium 100 MG capsule Commonly known as:  COLACE Take 1 capsule (100 mg total) by mouth 2 (two) times  daily.   feeding supplement (ENSURE ENLIVE) Liqd Take 237 mLs by mouth 2 (two) times daily between meals.   memantine 5 MG tablet Commonly known as:  NAMENDA Take 1 tablet (5 mg total) by mouth 2 (two) times daily.   mirtazapine 30 MG tablet Commonly known as:  REMERON Take 1 tablet (30 mg total) by mouth at bedtime.   traMADol 50 MG tablet Commonly known as:  ULTRAM Take 1/2 tablet by mouth every 6 hours as needed for moderate pain not relieved with Tylenol.   VENELEX Oint Apply 1 application topically See admin instructions. Apply to sacrum and bilateral buttocks q shift and prn       Review of Systems   This is somewhat limited secondary to patient being a poor historian  General does not complaining of acute pain here or fever or chills.  Respiratory is not complaining of shortness of breath.  Cardiac does not complain of chest pain does have Some continued edema of her left lower extremity.  GI is not really complaining of abdominal discomfort.  Musculoskeletal does not really complain at this point o  Neurologic is not complaining of numbness or dizziness.  Psych does have some history of dementia with confusion     There is no immunization history on file for this patient.     Pertinent  Health Maintenance Due  Topic Date Due  . DEXA SCAN  08/09/1999  . INFLUENZA VACCINE  05/07/2017 (Originally 01/06/2016)  . PNA vac Low Risk Adult (1 of 2 - PCV13) 05/07/2017 (Originally 08/09/1999)   No flowsheet data found. Functional Status Survey:   She is been afebrile pulse of 64 respirations 20 blood pressures recently 126/66-139/77-113/68  Physical Exam   In general this is a frail elderly female in no distress lying comfortably in bed  Her skin is warm and dry.  Chest is clear to auscultation there is no labored breathing.  Heart is regular rate and rhythm without murmur gallop or rub she does have 1-2 plus  eema of her left lower  extremity with difficult to palpate pedal pulse although I was able to palpate 1one-the edema is flesh-colored cold touch nontender  Abdomen soft nontender positive bowel sounds.  Musculoskeletal she does ambulate in a wheelchair during the day limited assessment since patient is lying in bed this evening she is able to move her upper extremities limited strength of her lower extremities again she does have a history of the hip fracture on the left.  Surgical site left hip appears to be healing unremarkably there is a well-healing surgical scar on the left hip  Neurologic appears grossly intact. Her speech is clear do not really appreciate  true lateralizing findings  Psych she is oriented to self is pleasant and cooperative    Labs reviewed:  08/17/2016.  Sodium 140 potassium 3.9 BUN 15 creatinine 0.63.  WBC 9.0 hemoglobin 10.9  platelets 429  RecentLabs(withinlast365days)   Recent Labs  08/03/16 0600 08/05/16 0700 08/10/16 0700  NA 138 139 140  K 3.6 3.7 3.8  CL 106 104 104  CO2 25 29 29   GLUCOSE 93 109* 106*  BUN 15 12 17   CREATININE 0.50 0.55 0.54  CALCIUM 8.3* 8.5* 9.1      RecentLabs(withinlast365days)   Recent Labs  07/22/16 0830 07/25/16 1130 07/26/16 0700  AST 73* 71* 48*  ALT 14 33 14  ALKPHOS 209* 281* 261*  BILITOT 0.8 0.3 0.6  PROT 6.2* 5.6* 5.5*  ALBUMIN 3.2* 2.7* 2.7*      RecentLabs(withinlast365days)   Recent Labs  08/01/16 1129  08/03/16 0600 08/05/16 0700 08/10/16 0700  WBC 15.5*  --  12.7* 12.5* 10.2  NEUTROABS 13.0*  --   --  9.3* 6.7  HGB 10.6*  < > 10.1* 10.8* 11.5*  HCT 31.4*  < > 30.6* 33.0* 35.7*  MCV 92.4  --  94.2 95.1 96.2  PLT 603*  --  569* 595* 556*  < > = values in this interval not displayed.   RecentLabs       Lab Results  Component Value Date   TSH 6.192 (H) 07/20/2016     RecentLabs   Assessment plan.  History of left hip fracture complicated with periprosthetic  subsequent fracture-she will need follow-up by orthopedics-for pain continues on the Tylenol 3 times a day 650 mg also has an order for Tramadol 25 mg every 6 hours when necessary.  for history of left peroneal vein DVT she is on Eliquis-currently 5 mg by mouth twice a day-she will need to be on this for approximately 3 months.  #2 history of dementia with confusion she appears relatively baseline still has some confusion but is pleasant and cooperative-she is on Namenda 5 mg twice a day-she is also on Remeron I suspect for possible coexistent depression-appetite stimulation.  She is on supplements including ensure Magic cup this will need follow-up obviously had her new facility.  #3 history of Parkinson's she is on Sinemet twice a day Will have follow-up with neurology as needed   #4 history of anemia hemoglobin appears to be stable at 10.9-this will need periodic monitoring especially since she is on an anticoagulant.--She has had mild thrombo-cytosis however this is near normalization at 429 on lab done yesterday   Of note she will be going again to another facility tomorrow-she will need periodic lab monitoring as well as orthopedic follow-up by neurology follow up as indicated for her history of Parkinson's  [id number]-of note greater than 30 minutes spent on this discharge summary-greater than 50% of time spent coordinating plan of care for numerous diagnoses.

## 2016-08-18 NOTE — Telephone Encounter (Signed)
Gave verbal order to remove staples today  Discharge summary stated Dr Romeo AppleHarrison would follow at nursing home  Does she need a follow up in office and if so, when?

## 2016-08-19 DIAGNOSIS — S3993XA Unspecified injury of pelvis, initial encounter: Secondary | ICD-10-CM | POA: Diagnosis not present

## 2016-08-19 DIAGNOSIS — F0280 Dementia in other diseases classified elsewhere without behavioral disturbance: Secondary | ICD-10-CM | POA: Diagnosis not present

## 2016-08-19 DIAGNOSIS — S329XXA Fracture of unspecified parts of lumbosacral spine and pelvis, initial encounter for closed fracture: Secondary | ICD-10-CM | POA: Diagnosis not present

## 2016-08-19 DIAGNOSIS — N390 Urinary tract infection, site not specified: Secondary | ICD-10-CM | POA: Diagnosis not present

## 2016-08-19 DIAGNOSIS — M549 Dorsalgia, unspecified: Secondary | ICD-10-CM | POA: Diagnosis not present

## 2016-08-19 DIAGNOSIS — F411 Generalized anxiety disorder: Secondary | ICD-10-CM | POA: Diagnosis not present

## 2016-08-19 DIAGNOSIS — M6281 Muscle weakness (generalized): Secondary | ICD-10-CM | POA: Diagnosis not present

## 2016-08-19 DIAGNOSIS — I10 Essential (primary) hypertension: Secondary | ICD-10-CM | POA: Diagnosis not present

## 2016-08-19 DIAGNOSIS — D519 Vitamin B12 deficiency anemia, unspecified: Secondary | ICD-10-CM | POA: Diagnosis not present

## 2016-08-19 DIAGNOSIS — Z4789 Encounter for other orthopedic aftercare: Secondary | ICD-10-CM | POA: Diagnosis not present

## 2016-08-19 DIAGNOSIS — S72002D Fracture of unspecified part of neck of left femur, subsequent encounter for closed fracture with routine healing: Secondary | ICD-10-CM | POA: Diagnosis not present

## 2016-08-19 DIAGNOSIS — Z79899 Other long term (current) drug therapy: Secondary | ICD-10-CM | POA: Diagnosis not present

## 2016-08-19 DIAGNOSIS — M79605 Pain in left leg: Secondary | ICD-10-CM | POA: Diagnosis not present

## 2016-08-19 DIAGNOSIS — R69 Illness, unspecified: Secondary | ICD-10-CM | POA: Diagnosis not present

## 2016-08-19 DIAGNOSIS — R269 Unspecified abnormalities of gait and mobility: Secondary | ICD-10-CM | POA: Diagnosis not present

## 2016-08-19 DIAGNOSIS — S59901A Unspecified injury of right elbow, initial encounter: Secondary | ICD-10-CM | POA: Diagnosis present

## 2016-08-19 DIAGNOSIS — G20 Parkinson's disease: Secondary | ICD-10-CM | POA: Diagnosis not present

## 2016-08-19 DIAGNOSIS — Z9181 History of falling: Secondary | ICD-10-CM | POA: Diagnosis not present

## 2016-08-19 DIAGNOSIS — M9702XD Periprosthetic fracture around internal prosthetic left hip joint, subsequent encounter: Secondary | ICD-10-CM | POA: Diagnosis not present

## 2016-08-19 DIAGNOSIS — Y92129 Unspecified place in nursing home as the place of occurrence of the external cause: Secondary | ICD-10-CM | POA: Diagnosis not present

## 2016-08-19 DIAGNOSIS — Z008 Encounter for other general examination: Secondary | ICD-10-CM | POA: Diagnosis not present

## 2016-08-19 DIAGNOSIS — S0990XA Unspecified injury of head, initial encounter: Secondary | ICD-10-CM | POA: Diagnosis not present

## 2016-08-19 DIAGNOSIS — R278 Other lack of coordination: Secondary | ICD-10-CM | POA: Diagnosis not present

## 2016-08-19 DIAGNOSIS — S199XXA Unspecified injury of neck, initial encounter: Secondary | ICD-10-CM | POA: Diagnosis not present

## 2016-08-19 DIAGNOSIS — G219 Secondary parkinsonism, unspecified: Secondary | ICD-10-CM | POA: Diagnosis not present

## 2016-08-19 DIAGNOSIS — Y939 Activity, unspecified: Secondary | ICD-10-CM | POA: Diagnosis not present

## 2016-08-19 DIAGNOSIS — Z043 Encounter for examination and observation following other accident: Secondary | ICD-10-CM | POA: Diagnosis not present

## 2016-08-19 DIAGNOSIS — Z7982 Long term (current) use of aspirin: Secondary | ICD-10-CM | POA: Diagnosis not present

## 2016-08-19 DIAGNOSIS — R41841 Cognitive communication deficit: Secondary | ICD-10-CM | POA: Diagnosis not present

## 2016-08-19 DIAGNOSIS — S51011A Laceration without foreign body of right elbow, initial encounter: Secondary | ICD-10-CM | POA: Diagnosis not present

## 2016-08-19 DIAGNOSIS — W19XXXA Unspecified fall, initial encounter: Secondary | ICD-10-CM | POA: Diagnosis not present

## 2016-08-19 DIAGNOSIS — R270 Ataxia, unspecified: Secondary | ICD-10-CM | POA: Diagnosis not present

## 2016-08-19 DIAGNOSIS — I824Y2 Acute embolism and thrombosis of unspecified deep veins of left proximal lower extremity: Secondary | ICD-10-CM | POA: Diagnosis not present

## 2016-08-19 DIAGNOSIS — F05 Delirium due to known physiological condition: Secondary | ICD-10-CM | POA: Diagnosis not present

## 2016-08-19 DIAGNOSIS — Y999 Unspecified external cause status: Secondary | ICD-10-CM | POA: Diagnosis not present

## 2016-08-19 DIAGNOSIS — R262 Difficulty in walking, not elsewhere classified: Secondary | ICD-10-CM | POA: Diagnosis not present

## 2016-08-19 NOTE — Telephone Encounter (Signed)
I WILL SEE TODAY

## 2016-08-23 ENCOUNTER — Telehealth: Payer: Self-pay | Admitting: Orthopedic Surgery

## 2016-08-23 NOTE — Telephone Encounter (Signed)
Per patient's daughter, Ms. [name] Oliver, Dr Romeo AppleHarrison will continue to see patient at facility, [facility].

## 2016-08-23 NOTE — Telephone Encounter (Signed)
Patient's daughter, [name] Oliver, PH# [phone] - relays that Ms. [name] Oliver is at [facility] [place], as of 08/19/16, following the visit she had with Dr Romeo AppleHarrison at [facility].  Please advise as to when you would like to set up a visit.

## 2016-08-26 DIAGNOSIS — S329XXA Fracture of unspecified parts of lumbosacral spine and pelvis, initial encounter for closed fracture: Secondary | ICD-10-CM | POA: Diagnosis not present

## 2016-08-26 DIAGNOSIS — Z9181 History of falling: Secondary | ICD-10-CM | POA: Diagnosis not present

## 2016-08-26 DIAGNOSIS — N390 Urinary tract infection, site not specified: Secondary | ICD-10-CM | POA: Diagnosis not present

## 2016-08-26 DIAGNOSIS — R270 Ataxia, unspecified: Secondary | ICD-10-CM | POA: Diagnosis not present

## 2016-08-30 ENCOUNTER — Telehealth: Payer: Self-pay | Admitting: Orthopedic Surgery

## 2016-08-30 NOTE — Telephone Encounter (Signed)
Patient's daughter Huntley Estellenna Jane Daniel, PH# [phone] would like for you to please give her a call in regards to her mom [name] Oliver.

## 2016-09-01 DIAGNOSIS — N390 Urinary tract infection, site not specified: Secondary | ICD-10-CM | POA: Diagnosis not present

## 2016-09-01 DIAGNOSIS — R270 Ataxia, unspecified: Secondary | ICD-10-CM | POA: Diagnosis not present

## 2016-09-01 DIAGNOSIS — Z9181 History of falling: Secondary | ICD-10-CM | POA: Diagnosis not present

## 2016-09-01 DIAGNOSIS — S329XXA Fracture of unspecified parts of lumbosacral spine and pelvis, initial encounter for closed fracture: Secondary | ICD-10-CM | POA: Diagnosis not present

## 2016-09-06 DIAGNOSIS — F411 Generalized anxiety disorder: Secondary | ICD-10-CM | POA: Diagnosis not present

## 2016-09-06 DIAGNOSIS — F0280 Dementia in other diseases classified elsewhere without behavioral disturbance: Secondary | ICD-10-CM | POA: Diagnosis not present

## 2016-09-06 DIAGNOSIS — R41841 Cognitive communication deficit: Secondary | ICD-10-CM | POA: Diagnosis not present

## 2016-09-06 DIAGNOSIS — R270 Ataxia, unspecified: Secondary | ICD-10-CM | POA: Diagnosis not present

## 2016-09-07 ENCOUNTER — Telehealth: Payer: Self-pay | Admitting: Orthopedic Surgery

## 2016-09-07 NOTE — Telephone Encounter (Signed)
-----   Message from Vickki Hearing, MD sent at 08/02/2016  8:17 AM EST ----- Friday if possible or Monday see at [facility]

## 2016-09-20 DIAGNOSIS — G20 Parkinson's disease: Secondary | ICD-10-CM | POA: Diagnosis not present

## 2016-09-20 DIAGNOSIS — Z4789 Encounter for other orthopedic aftercare: Secondary | ICD-10-CM | POA: Diagnosis not present

## 2016-09-20 DIAGNOSIS — F411 Generalized anxiety disorder: Secondary | ICD-10-CM | POA: Diagnosis not present

## 2016-10-01 ENCOUNTER — Encounter: Payer: Self-pay | Admitting: Neurology

## 2016-10-01 ENCOUNTER — Ambulatory Visit (INDEPENDENT_AMBULATORY_CARE_PROVIDER_SITE_OTHER): Payer: Medicare Other | Admitting: Neurology

## 2016-10-01 VITALS — BP 117/70 | HR 83 | Ht 62.0 in

## 2016-10-01 DIAGNOSIS — F0280 Dementia in other diseases classified elsewhere without behavioral disturbance: Secondary | ICD-10-CM

## 2016-10-01 DIAGNOSIS — G20 Parkinson's disease: Secondary | ICD-10-CM | POA: Diagnosis not present

## 2016-10-01 DIAGNOSIS — R269 Unspecified abnormalities of gait and mobility: Secondary | ICD-10-CM | POA: Diagnosis not present

## 2016-10-01 DIAGNOSIS — G219 Secondary parkinsonism, unspecified: Secondary | ICD-10-CM

## 2016-10-01 MED ORDER — QUETIAPINE FUMARATE 25 MG PO TABS: 12.5000 mg | ORAL_TABLET | Freq: Every day | ORAL | 0 refills | Status: DC

## 2016-10-01 MED ORDER — QUETIAPINE FUMARATE 25 MG PO TABS: 12.5000 mg | ORAL_TABLET | Freq: Every day | ORAL | Status: DC

## 2016-10-01 NOTE — Progress Notes (Signed)
Reason for visit: Parkinson's disease  [name] Oliver is an 83 y.o. female  History of present illness:  [name] Oliver is an 83 year old white female with a history of Parkinson's disease associated with a memory disturbance and a significant gait disturbance. The patient was admitted to the hospital on 07/14/2016 after a fall associated with a left hip fracture. The patient required a hip replacement, but unfortunately she fell again on 07/23/2016 fracturing the lesser trochanter on the left hip, once again requiring surgery. The patient has been residing at an extended care facility, [facility] [place]. The patient is undergoing physical therapy currently, she is walking with assistance and with a walker. She has not had any further falls. She is having problems with sundowning at 4 PM every day. The patient may have occasional hallucinations. She is on Sinemet 25/100, one tablet twice daily. She has had BuSpar added to her regimen has helped her anxiety issues significantly. The patient returns for an evaluation.  Past Medical History:  Diagnosis Date  . Chronic cough   . Dementia   . Hip fracture (HCC)   . Memory difficulty 01/08/2014  . Pelvis fracture (HCC)   . Primary Parkinsonism [facility])     Past Surgical History:  Procedure Laterality Date  . ABDOMINAL HYSTERECTOMY    . ABDOMINAL HYSTERECTOMY    . BREAST LUMPECTOMY Right   . CATARACT EXTRACTION Bilateral    03/14/13  . FRACTURE SURGERY    . HIP ARTHROPLASTY Left 07/15/2016   Procedure: BIPOLAR REPLACEMENT LEFT HIP;  Surgeon: Vickki Hearing, MD;  Location: [facility];  Service: Orthopedics;  Laterality: Left;  . JOINT REPLACEMENT    . ORIF HIP FRACTURE Left 07/28/2016   Procedure: Revision of bipolar hip replacement into unipolar hip replacement;  Surgeon: Vickki Hearing, MD;  Location: [facility];  Service: Orthopedics;  Laterality: Left;  . TOTAL HIP ARTHROPLASTY Right     Family History  Problem Relation Age of Onset  .  Dementia Mother   . Bipolar disorder Sister   . Parkinsonism Sister   . Diabetes Other     Social history:  reports that she has never smoked. She has never used smokeless tobacco. She reports that she does not drink alcohol or use drugs.    Allergies  Allergen Reactions  . Morphine And Related Other (See Comments)    Hallucinations  . Prednisone Other (See Comments)    manic  . Namzaric [Memantine Hcl-Donepezil Hcl] Anxiety    Medications:  Prior to Admission medications   Medication Sig Start Date End Date Taking? Authorizing Provider  acetaminophen (TYLENOL) 500 MG tablet Take 1 tablet (500 mg total) by mouth every 6 (six) hours. 07/18/16  Yes Erick Blinks, MD  apixaban (ELIQUIS) 5 MG TABS tablet Take 5 mg by mouth 2 (two) times daily.   Yes Historical Provider, MD  aspirin EC 325 MG EC tablet Take 1 tablet (325 mg total) by mouth daily with breakfast. 07/19/16  Yes Erick Blinks, MD  Lucilla [place]ame [place]-Castor Oil [facility]) OINT Apply 1 application topically See admin instructions. Apply to sacrum and bilateral buttocks q shift and prn    Yes Historical Provider, MD  bisacodyl (DULCOLAX) 5 MG EC tablet Take 1 tablet (5 mg total) by mouth daily as needed for moderate constipation. 08/02/16  Yes Vickki Hearing, MD  BusPIRone HCl (BUSPAR PO) Take 5 mg by mouth 3 (three) times daily.   Yes Historical Provider, MD  carbidopa-levodopa (SINEMET IR) 25-100 MG  tablet take 1 tablet by mouth twice a day 03/18/16  Yes York Spaniel, MD  ciprofloxacin (CIPRO) 250 MG tablet Take 250 mg by mouth 2 (two) times daily.   Yes Historical Provider, MD  Cranberry 450 MG CAPS Take 1 capsule by mouth 2 (two) times daily.   Yes Historical Provider, MD  cyanocobalamin (,VITAMIN B-12,) 1000 MCG/ML injection Inject 1 mL (1,000 mcg total) into the muscle every 30 (thirty) days. 03/11/16  Yes York Spaniel, MD  docusate sodium (COLACE) 100 MG capsule Take 1 capsule (100 mg total) by mouth 2 (two) times daily.  07/18/16  Yes Erick Blinks, MD  feeding supplement, ENSURE ENLIVE, (ENSURE ENLIVE) LIQD Take 237 mLs by mouth 2 (two) times daily between meals. 08/02/16  Yes Vickki Hearing, MD  Melatonin 3 MG TABS Take 1 tablet by mouth at bedtime.   Yes Historical Provider, MD  memantine (NAMENDA) 5 MG tablet Take 1 tablet (5 mg total) by mouth 2 (two) times daily. 07/14/16  Yes York Spaniel, MD  mirtazapine (REMERON) 30 MG tablet Take 1 tablet (30 mg total) by mouth at bedtime. 11/10/15  Yes York Spaniel, MD  traMADol (ULTRAM) 50 MG tablet Take 1/2 tablet by mouth every 6 hours as needed for moderate pain not relieved with Tylenol. 08/03/16  Yes Sharon Seller, NP    ROS:  Out of a complete 14 system review of symptoms, the patient complains only of the following symptoms, and all other reviewed systems are negative.  Memory loss Agitation, depression, anxiety, hallucinations Gait disturbance  Blood pressure 117/70, pulse 83, height  (1.575 m), SpO2 95 %.  Physical Exam  General: The patient is alert and cooperative at the time of the examination.  Skin: No significant peripheral edema is noted.   Neurologic Exam  Mental status: The patient is alert and oriented x 3 at the time of the examination. The Mini-Mental Status Examination done today shows a total score 22/30.   Cranial nerves: Facial symmetry is present. Speech is normal, no aphasia or dysarthria is noted. Extraocular movements are full. Visual fields are full.  Motor: The patient has good strength in all 4 extremities.  Sensory examination: Soft touch sensation is symmetric on the face, arms, and legs.  Coordination: The patient has good finger-nose-finger bilaterally. The patient has severe apraxia with the use of the left upper extremity.  Gait and station: The patient requires assistance with standing and with walking. The patient has a very definite tendency to lean backwards. Gait is extremely  unsteady.  Reflexes: Deep tendon reflexes are symmetric, but are somewhat brisk throughout.   Assessment/Plan:  1. Parkinson's disease  2. Gait disorder  3. Dementia  The patient will be placed on low-dose Seroquel taking 12.5 mg at 4 PM. In the future, I would like to increase his Sinemet if possible. Some agitation may be associated with the strange environment in the rehabilitation facility. The patient will follow-up in 3 months. The patient appears to have a significant gait disorder, she has a tendency to lean backwards. Severe apraxia is noted with the use of the extremities.  Marlan Palau MD 10/01/2016 12:37 PM  [facility] [address] [place], [place] [address]  Phone [phone] Fax [phone]

## 2016-10-04 DIAGNOSIS — G20 Parkinson's disease: Secondary | ICD-10-CM | POA: Diagnosis not present

## 2016-10-04 DIAGNOSIS — F411 Generalized anxiety disorder: Secondary | ICD-10-CM | POA: Diagnosis not present

## 2016-10-04 DIAGNOSIS — F05 Delirium due to known physiological condition: Secondary | ICD-10-CM | POA: Diagnosis not present

## 2016-10-18 ENCOUNTER — Emergency Department (HOSPITAL_COMMUNITY)
Admission: EM | Admit: 2016-10-18 | Discharge: 2016-10-18 | Disposition: A | Discharge status: Home / Self Care | Payer: Medicare Other | Attending: Emergency Medicine | Admitting: Emergency Medicine

## 2016-10-18 ENCOUNTER — Emergency Department (HOSPITAL_COMMUNITY): Payer: Medicare Other

## 2016-10-18 ENCOUNTER — Telehealth: Payer: Self-pay | Admitting: Orthopedic Surgery

## 2016-10-18 ENCOUNTER — Encounter (HOSPITAL_COMMUNITY): Payer: Self-pay | Admitting: Emergency Medicine

## 2016-10-18 DIAGNOSIS — G20 Parkinson's disease: Secondary | ICD-10-CM | POA: Insufficient documentation

## 2016-10-18 DIAGNOSIS — Y92129 Unspecified place in nursing home as the place of occurrence of the external cause: Secondary | ICD-10-CM | POA: Insufficient documentation

## 2016-10-18 DIAGNOSIS — Z7982 Long term (current) use of aspirin: Secondary | ICD-10-CM | POA: Insufficient documentation

## 2016-10-18 DIAGNOSIS — I10 Essential (primary) hypertension: Secondary | ICD-10-CM | POA: Diagnosis not present

## 2016-10-18 DIAGNOSIS — Y939 Activity, unspecified: Secondary | ICD-10-CM | POA: Insufficient documentation

## 2016-10-18 DIAGNOSIS — S51011A Laceration without foreign body of right elbow, initial encounter: Secondary | ICD-10-CM | POA: Insufficient documentation

## 2016-10-18 DIAGNOSIS — Z79899 Other long term (current) drug therapy: Secondary | ICD-10-CM | POA: Insufficient documentation

## 2016-10-18 DIAGNOSIS — S199XXA Unspecified injury of neck, initial encounter: Secondary | ICD-10-CM | POA: Diagnosis not present

## 2016-10-18 DIAGNOSIS — M549 Dorsalgia, unspecified: Secondary | ICD-10-CM | POA: Diagnosis not present

## 2016-10-18 DIAGNOSIS — S0990XA Unspecified injury of head, initial encounter: Secondary | ICD-10-CM | POA: Diagnosis not present

## 2016-10-18 DIAGNOSIS — Z008 Encounter for other general examination: Secondary | ICD-10-CM | POA: Diagnosis not present

## 2016-10-18 DIAGNOSIS — Z043 Encounter for examination and observation following other accident: Secondary | ICD-10-CM | POA: Diagnosis not present

## 2016-10-18 DIAGNOSIS — W19XXXA Unspecified fall, initial encounter: Secondary | ICD-10-CM | POA: Insufficient documentation

## 2016-10-18 DIAGNOSIS — S3993XA Unspecified injury of pelvis, initial encounter: Secondary | ICD-10-CM | POA: Diagnosis not present

## 2016-10-18 DIAGNOSIS — Y999 Unspecified external cause status: Secondary | ICD-10-CM | POA: Insufficient documentation

## 2016-10-18 NOTE — ED Notes (Signed)
Pt left with daughter.

## 2016-10-18 NOTE — ED Provider Notes (Signed)
[facility] DE[facility] Provider Note   CSN: [id number] Arrival date & time: 10/18/16  1529     History   Chief Complaint Chief Complaint  Patient presents with  . Fall    HPI [name] Oliver is a 83 y.o. female.  Patient had a fall at the nursing home. No one witnessed it. Patient was laying on the floor.   The history is provided by the patient. No language interpreter was used.  Fall  This is a new problem. The current episode started 3 to 5 hours ago. The problem occurs rarely. The problem has been resolved. Pertinent negatives include no chest pain. Nothing aggravates the symptoms. Nothing relieves the symptoms.    Past Medical History:  Diagnosis Date  . Chronic cough   . Dementia   . Hip fracture (HCC)   . Memory difficulty 01/08/2014  . Pelvis fracture (HCC)   . Primary Parkinsonism [facility])     Patient Active Problem List   Diagnosis Date Noted  . Left leg DVT (HCC) 08/12/2016  . Periprosthetic fracture around internal prosthetic hip joint 07/26/2016  . Anemia 07/16/2016  . Femoral neck fracture (HCC) 07/15/2016  . Closed fracture of neck of left femur (HCC)   . Chronic fatigue 06/14/2016  . Memory difficulty 01/08/2014  . Parkinson's disease dementia (HCC) 10/28/2013  . Abnormality of gait 09/26/2013  . Parkinsonism (HCC) 09/26/2013  . Urinary tract infection without hematuria 09/24/2013  . Urinary retention 09/23/2013  . Unspecified constipation 09/23/2013  . Accelerated hypertension 09/20/2013  . Fall 09/19/2013  . Pelvic fracture (HCC) 09/19/2013  . Leukocytosis 09/19/2013    Past Surgical History:  Procedure Laterality Date  . ABDOMINAL HYSTERECTOMY    . ABDOMINAL HYSTERECTOMY    . BREAST LUMPECTOMY Right   . CATARACT EXTRACTION Bilateral    03/14/13  . FRACTURE SURGERY    . HIP ARTHROPLASTY Left 07/15/2016   Procedure: BIPOLAR REPLACEMENT LEFT HIP;  Surgeon: Vickki Hearing, MD;  Location: [facility];  Service: Orthopedics;  Laterality: Left;  . JOINT  REPLACEMENT    . ORIF HIP FRACTURE Left 07/28/2016   Procedure: Revision of bipolar hip replacement into unipolar hip replacement;  Surgeon: Vickki Hearing, MD;  Location: [facility];  Service: Orthopedics;  Laterality: Left;  . TOTAL HIP ARTHROPLASTY Right     OB History    Gravida Para Term Preterm AB Living   3 2 2   1      SAB TAB Ectopic Multiple Live Births   1               Home Medications    Prior to Admission medications   Medication Sig Start Date End Date Taking? Authorizing Provider  acetaminophen (TYLENOL) 500 MG tablet Take 1 tablet (500 mg total) by mouth every 6 (six) hours. 07/18/16  Yes Erick Blinks, MD  aspirin EC 325 MG EC tablet Take 1 tablet (325 mg total) by mouth daily with breakfast. 07/19/16  Yes Erick Blinks, MD  Lucilla [employer] [place] [facility]) OINT Apply 1 application topically See admin instructions. Apply to sacrum and bilateral buttocks q shift and prn    Yes [provider]  bisacodyl (DULCOLAX) 5 MG EC tablet Take 1 tablet (5 mg total) by mouth daily as needed for moderate constipation. 08/02/16  Yes Vickki Hearing, MD  busPIRone (BUSPAR) 5 MG tablet Take 5 mg by mouth 3 (three) times daily.   Yes [provider]  carbidopa-levodopa (SINEMET IR) 25-100 MG tablet take 1  tablet by mouth twice a day 03/18/16  Yes York Spaniel, MD  Cranberry 450 MG CAPS Take 1 capsule by mouth 2 (two) times daily.   Yes [provider]  docusate sodium (COLACE) 100 MG capsule Take 1 capsule (100 mg total) by mouth 2 (two) times daily. 07/18/16  Yes Erick Blinks, MD  memantine (NAMENDA) 5 MG tablet Take 1 tablet (5 mg total) by mouth 2 (two) times daily. 07/14/16  Yes York Spaniel, MD  mirtazapine (REMERON) 30 MG tablet Take 1 tablet (30 mg total) by mouth at bedtime. 11/10/15  Yes York Spaniel, MD  QUEtiapine (SEROQUEL) 25 MG tablet Take 0.5 tablets (12.5 mg total) by mouth daily before supper. Give around 4 pm 10/01/16  Yes  York Spaniel, MD  traMADol (ULTRAM) 50 MG tablet Take 1/2 tablet by mouth every 6 hours as needed for moderate pain not relieved with Tylenol. 08/03/16  Yes Sharon Seller, NP  vitamin B-12 (CYANOCOBALAMIN) 1000 MCG tablet Take 1,000 mcg by mouth daily.   Yes [provider]    Family History Family History  Problem Relation Age of Onset  . Dementia Mother   . Bipolar disorder Sister   . Parkinsonism Sister   . Diabetes Other     Social History Social History  Substance Use Topics  . Smoking status: Never Smoker  . Smokeless tobacco: Never Used  . Alcohol use No     Allergies   Morphine and related; Prednisone; Percocet [oxycodone-acetaminophen]; and Namzaric [memantine hcl-donepezil hcl]   Review of Systems Review of Systems  Unable to perform ROS: Dementia  Cardiovascular: Negative for chest pain.     Physical Exam Updated Vital Signs BP (!) 142/72   Pulse 82   Temp 98.4 F (36.9 C) (Oral)   Resp 18   Ht 5\' 4"  (1.626 m)   Wt 123 lb (55.8 kg)   SpO2 98%   BMI 21.11 kg/m   Physical Exam  Constitutional: She is oriented to person, place, and time. She appears well-developed.  HENT:  Head: Normocephalic.  Eyes: Conjunctivae and EOM are normal. No scleral icterus.  Neck: Neck supple. No thyromegaly present.  Cardiovascular: Normal rate and regular rhythm.  Exam reveals no gallop and no friction rub.   No murmur heard. Pulmonary/Chest: No stridor. She has no wheezes. She has no rales. She exhibits no tenderness.  Abdominal: She exhibits no distension. There is no tenderness. There is no rebound.  Musculoskeletal: Normal range of motion. She exhibits no edema.  Lymphadenopathy:    She has no cervical adenopathy.  Neurological: She is oriented to person, place, and time. She exhibits normal muscle tone. Coordination normal.  Skin: No rash noted. No erythema.  Psychiatric: She has a normal mood and affect. Her behavior is normal.     ED  Treatments / Results  Labs (all labs ordered are listed, but only abnormal results are displayed) Labs Reviewed - No data to display  EKG  EKG Interpretation None       Radiology Dg Pelvis 1-2 Views  Result Date: 10/18/2016 CLINICAL DATA:  [facility]witnessed [facility] and [facility]. History of bilateral hip replacements. EXAM: PELVIS - 1-2 VIEW COMPARISON:  Portable [facility] view of both hips of August 03, 2016 FINDINGS: The bony pelvis is osteopenic. No acute pelvic fracture is observed. There is old deformity of the inferior pubic ramus on the right. The prosthetic hip joints appear appropriately position. There is no evidence of loosening or  prosthesis fracture. No native bone fracture is observed. IMPRESSION: No acute abnormality of the native bone or prosthetic hips is observed. Electronically Signed   By: David  [place]and M.D.   On: 10/18/2016 16:06   Ct Head Wo Contrast  Result Date: 10/18/2016 CLINICAL DATA:  Unwitnessed fall, found down by physical therapist. History of dementia. EXAM: CT HEAD WITHOUT CONTRAST CT CERVICAL SPINE WITHOUT CONTRAST TECHNIQUE: Multidetector CT imaging of the head and cervical spine was performed following the standard protocol without intravenous contrast. Multiplanar CT image reconstructions of the cervical spine were also generated. COMPARISON:  MRI of the head March 02, 2013 FINDINGS: CT HEAD FINDINGS BRAIN: No intraparenchymal hemorrhage, mass effect nor midline shift. The ventricles and sulci are normal for age. Patchy supratentorial white matter hypodensities within normal range for patient's age, though non-specific are most compatible with chronic small vessel ischemic disease. Mild symmetric basal ganglia atrophy associated with Parkinson's disease. No acute large vascular territory infarcts. No abnormal extra-axial fluid collections. Basal cisterns are patent. VASCULAR: Mild calcific atherosclerosis of the carotid siphons. SKULL: No skull fracture. No  significant scalp soft tissue swelling. SINUSES/ORBITS: Small RIGHT sphenoid sinus air-fluid level. Mastoid air cells are well aerated. The included ocular globes and orbital contents are non-suspicious. Status post bilateral ocular lens implants. OTHER: None. CT CERVICAL SPINE FINDINGS ALIGNMENT: Maintained lordosis. Vertebral bodies in alignment. SKULL BASE AND VERTEBRAE: Cervical vertebral bodies and posterior elements are intact. Severe C3-4, moderate to severe C5-6 and C6-7, moderate C4-5 disc height loss with uncovertebral hypertrophy and endplate spurring compatible with degenerative discs. Osteopenia without destructive bony lesions. C1-2 articulation maintained with moderate arthropathy. SOFT TISSUES AND SPINAL CANAL: Nonacute, mild calcific atherosclerosis DISC LEVELS: Mild canal stenosis C3-4. Moderate to severe RIGHT C3-4, LEFT C4-5 and severe LEFT C5-6 neural foraminal narrowing. UPPER CHEST: Lung apices are clear. OTHER: None. IMPRESSION: CT HEAD: No acute intracranial process. Stable examination including findings of chronic Parkinson's disease and mild chronic small vessel ischemic disease. CT CERVICAL SPINE: No acute fracture or malalignment. Electronically Signed   By: Awilda Metro M.D.   On: 10/18/2016 16:36   Ct Cervical Spine Wo Contrast  Result Date: 10/18/2016 CLINICAL DATA:  Unwitnessed fall, found down by physical therapist. History of dementia. EXAM: CT HEAD WITHOUT CONTRAST CT CERVICAL SPINE WITHOUT CONTRAST TECHNIQUE: Multidetector CT imaging of the head and cervical spine was performed following the standard protocol without intravenous contrast. Multiplanar CT image reconstructions of the cervical spine were also generated. COMPARISON:  MRI of the head March 02, 2013 FINDINGS: CT HEAD FINDINGS BRAIN: No intraparenchymal hemorrhage, mass effect nor midline shift. The ventricles and sulci are normal for age. Patchy supratentorial white matter hypodensities within normal range  for patient's age, though non-specific are most compatible with chronic small vessel ischemic disease. Mild symmetric basal ganglia atrophy associated with Parkinson's disease. No acute large vascular territory infarcts. No abnormal extra-axial fluid collections. Basal cisterns are patent. VASCULAR: Mild calcific atherosclerosis of the carotid siphons. SKULL: No skull fracture. No significant scalp soft tissue swelling. SINUSES/ORBITS: Small RIGHT sphenoid sinus air-fluid level. Mastoid air cells are well aerated. The included ocular globes and orbital contents are non-suspicious. Status post bilateral ocular lens implants. OTHER: None. CT CERVICAL SPINE FINDINGS ALIGNMENT: Maintained lordosis. Vertebral bodies in alignment. SKULL BASE AND VERTEBRAE: Cervical vertebral bodies and posterior elements are intact. Severe C3-4, moderate to severe C5-6 and C6-7, moderate C4-5 disc height loss with uncovertebral hypertrophy and endplate spurring compatible with degenerative discs. Osteopenia without destructive bony  lesions. C1-2 articulation maintained with moderate arthropathy. SOFT TISSUES AND SPINAL CANAL: Nonacute, mild calcific atherosclerosis DISC LEVELS: Mild canal stenosis C3-4. Moderate to severe RIGHT C3-4, LEFT C4-5 and severe LEFT C5-6 neural foraminal narrowing. UPPER CHEST: Lung apices are clear. OTHER: None. IMPRESSION: CT HEAD: No acute intracranial process. Stable examination including findings of chronic Parkinson's disease and mild chronic small vessel ischemic disease. CT CERVICAL SPINE: No acute fracture or malalignment. Electronically Signed   By: Awilda Metroourtnay  Bloomer M.D.   On: 10/18/2016 16:36    Procedures Procedures (including critical care time)  Medications Ordered in ED Medications - No data to display   Initial Impression / Assessment and Plan / ED Course  I have reviewed the triage vital signs and the nursing notes.  Pertinent labs & imaging results that were available during my  care of the patient were reviewed by me and considered in my medical decision making (see chart for details).     Patient CT head cervical spine that did not show any trauma. She also had pelvis x-ray that showed her to prosthesis not fractured or dislocated. She will follow-up as needed.  Final Clinical Impressions(s) / ED Diagnoses   Final diagnoses:  Fall, initial encounter    New Prescriptions New Prescriptions   No medications on file     Bethann BerkshireZammit, Ashaz Robling, MD 10/18/16 1943

## 2016-10-18 NOTE — Telephone Encounter (Signed)
Patient's daughter Ms. [name] Oliver, ph# [phone], called to relay that her mom had another fall at approximately 1:30p.m. States mom is at a local facility (a different facility than previous one, when Dr Romeo AppleHarrison last did surgery, 07/15/16, and 07/28/16).  Wanted to let Dr Romeo AppleHarrison know, before she asked facility to transport her to [facility].  Relayed that I will let Dr Romeo AppleHarrison know, but that it is advisable to have patient taken to the Emergency room for complete evaluation and work-up.

## 2016-10-18 NOTE — ED Triage Notes (Signed)
Per EMS: Pt from [facility], was found on floor by physical therapist after unwitnessed fall.  Pt was laying on floor for appx 1 hour.  Pt is not sure if she hit head or had LOC. Pt has dementia. Pt has had left groin pain x4 months.  Pt has skin tear to right elbow.  Pt denies new neck pain, has had back pain before fall, and states she is hurting worse in back.

## 2016-10-18 NOTE — Telephone Encounter (Signed)
I WOULD HAVE HER EVALUATED AT ER

## 2016-10-18 NOTE — Discharge Instructions (Signed)
Follow up with dr. Harrison if problems °

## 2016-10-19 NOTE — Telephone Encounter (Signed)
Per patient's daughter, [name] Oliver, patient did have evaluation at [facility] yesterday, 10/18/16, including CT of head and hip/pelvis Xrays - which she states showed no fractures.  She states "in the last conversation she had with Dr Romeo AppleHarrison" that her mom can continue to be seen at the nursing facility, until she can come to the office.  States she hasn't been seen since she's been moved to [facility] at [place].  States she can now transport patient to office for follow up visit unless Dr Romeo AppleHarrison would want to see her at [facility].  Please advise. Daughter's [phone]#[phone]

## 2016-10-19 NOTE — Telephone Encounter (Signed)
SET UP VISIT HERE FOR NEXT WEEK

## 2016-10-20 NOTE — Telephone Encounter (Signed)
Called patient's daughter this morning, 10/20/16, and scheduled accordingly.

## 2016-10-21 DIAGNOSIS — F411 Generalized anxiety disorder: Secondary | ICD-10-CM | POA: Diagnosis not present

## 2016-10-21 DIAGNOSIS — G20 Parkinson's disease: Secondary | ICD-10-CM | POA: Diagnosis not present

## 2016-10-21 DIAGNOSIS — N390 Urinary tract infection, site not specified: Secondary | ICD-10-CM | POA: Diagnosis not present

## 2016-10-21 DIAGNOSIS — F05 Delirium due to known physiological condition: Secondary | ICD-10-CM | POA: Diagnosis not present

## 2016-10-25 ENCOUNTER — Encounter: Payer: Self-pay | Admitting: Orthopedic Surgery

## 2016-10-25 ENCOUNTER — Ambulatory Visit (INDEPENDENT_AMBULATORY_CARE_PROVIDER_SITE_OTHER): Payer: Self-pay | Admitting: Orthopedic Surgery

## 2016-10-25 DIAGNOSIS — F05 Delirium due to known physiological condition: Secondary | ICD-10-CM | POA: Diagnosis not present

## 2016-10-25 DIAGNOSIS — G20 Parkinson's disease: Secondary | ICD-10-CM | POA: Diagnosis not present

## 2016-10-25 DIAGNOSIS — F0280 Dementia in other diseases classified elsewhere without behavioral disturbance: Secondary | ICD-10-CM | POA: Diagnosis not present

## 2016-10-25 DIAGNOSIS — Z4889 Encounter for other specified surgical aftercare: Secondary | ICD-10-CM

## 2016-10-25 DIAGNOSIS — N390 Urinary tract infection, site not specified: Secondary | ICD-10-CM | POA: Diagnosis not present

## 2016-10-25 NOTE — Progress Notes (Signed)
Patient ID: [name] Oliver, female   DOB: 05/14/1933, 83 y.o.   MRN: [id number]  Chief Complaint  Patient presents with  . Follow-up    LEFT BIPOLAR HIP REVISION 07/28/16, FALL 10/18/16    83 year old female with UTI induced chronic mental status changes which are only present when UTI is ongoing. Apparently she is in the course of UTI now.  She had a left hip prosthesis she fell we had to do a revision with a longstem  She is done well with that she is able to ambulate with a walker and standby assist    ROS    There were no vitals taken for this visit. She appears disoriented although She is awake and alert Ortho Exam her leg lengths are equal her hip flexion is 120   The last x-ray shows 3 cerclage wires around the proximal femur fracture for lesser and greater trochanteric avulsions with longstem prosthesis and bipolar hip.  They looked normal with heterotopic ossification grade 3  Follow-up with us as needed A/P    Encounter Diagnosis  Name Primary?  Marland Kitchen. Aftercare following surgery Yes

## 2016-11-02 DIAGNOSIS — S329XXD Fracture of unspecified parts of lumbosacral spine and pelvis, subsequent encounter for fracture with routine healing: Secondary | ICD-10-CM | POA: Diagnosis not present

## 2016-11-02 DIAGNOSIS — F411 Generalized anxiety disorder: Secondary | ICD-10-CM | POA: Diagnosis not present

## 2016-11-02 DIAGNOSIS — Z9181 History of falling: Secondary | ICD-10-CM | POA: Diagnosis not present

## 2016-11-02 DIAGNOSIS — F0281 Dementia in other diseases classified elsewhere with behavioral disturbance: Secondary | ICD-10-CM | POA: Diagnosis not present

## 2016-11-02 DIAGNOSIS — S72002D Fracture of unspecified part of neck of left femur, subsequent encounter for closed fracture with routine healing: Secondary | ICD-10-CM | POA: Diagnosis not present

## 2016-11-02 DIAGNOSIS — G20 Parkinson's disease: Secondary | ICD-10-CM | POA: Diagnosis not present

## 2016-11-03 DIAGNOSIS — G894 Chronic pain syndrome: Secondary | ICD-10-CM | POA: Diagnosis not present

## 2016-11-03 DIAGNOSIS — Z7982 Long term (current) use of aspirin: Secondary | ICD-10-CM | POA: Diagnosis not present

## 2016-11-03 DIAGNOSIS — G20 Parkinson's disease: Secondary | ICD-10-CM | POA: Diagnosis not present

## 2016-11-03 DIAGNOSIS — K5900 Constipation, unspecified: Secondary | ICD-10-CM | POA: Diagnosis not present

## 2016-11-03 DIAGNOSIS — R531 Weakness: Secondary | ICD-10-CM | POA: Diagnosis not present

## 2016-11-03 DIAGNOSIS — Z87440 Personal history of urinary (tract) infections: Secondary | ICD-10-CM | POA: Diagnosis not present

## 2016-11-03 DIAGNOSIS — D518 Other vitamin B12 deficiency anemias: Secondary | ICD-10-CM | POA: Diagnosis not present

## 2016-11-04 DIAGNOSIS — S329XXD Fracture of unspecified parts of lumbosacral spine and pelvis, subsequent encounter for fracture with routine healing: Secondary | ICD-10-CM | POA: Diagnosis not present

## 2016-11-04 DIAGNOSIS — F0281 Dementia in other diseases classified elsewhere with behavioral disturbance: Secondary | ICD-10-CM | POA: Diagnosis not present

## 2016-11-04 DIAGNOSIS — S72002D Fracture of unspecified part of neck of left femur, subsequent encounter for closed fracture with routine healing: Secondary | ICD-10-CM | POA: Diagnosis not present

## 2016-11-04 DIAGNOSIS — F411 Generalized anxiety disorder: Secondary | ICD-10-CM | POA: Diagnosis not present

## 2016-11-04 DIAGNOSIS — Z9181 History of falling: Secondary | ICD-10-CM | POA: Diagnosis not present

## 2016-11-04 DIAGNOSIS — G20 Parkinson's disease: Secondary | ICD-10-CM | POA: Diagnosis not present

## 2016-11-05 DIAGNOSIS — S329XXD Fracture of unspecified parts of lumbosacral spine and pelvis, subsequent encounter for fracture with routine healing: Secondary | ICD-10-CM | POA: Diagnosis not present

## 2016-11-05 DIAGNOSIS — F411 Generalized anxiety disorder: Secondary | ICD-10-CM | POA: Diagnosis not present

## 2016-11-05 DIAGNOSIS — S72002D Fracture of unspecified part of neck of left femur, subsequent encounter for closed fracture with routine healing: Secondary | ICD-10-CM | POA: Diagnosis not present

## 2016-11-05 DIAGNOSIS — F0281 Dementia in other diseases classified elsewhere with behavioral disturbance: Secondary | ICD-10-CM | POA: Diagnosis not present

## 2016-11-05 DIAGNOSIS — G20 Parkinson's disease: Secondary | ICD-10-CM | POA: Diagnosis not present

## 2016-11-05 DIAGNOSIS — Z9181 History of falling: Secondary | ICD-10-CM | POA: Diagnosis not present

## 2016-11-08 DIAGNOSIS — N390 Urinary tract infection, site not specified: Secondary | ICD-10-CM | POA: Diagnosis not present

## 2016-11-10 DIAGNOSIS — F411 Generalized anxiety disorder: Secondary | ICD-10-CM | POA: Diagnosis not present

## 2016-11-10 DIAGNOSIS — S329XXD Fracture of unspecified parts of lumbosacral spine and pelvis, subsequent encounter for fracture with routine healing: Secondary | ICD-10-CM | POA: Diagnosis not present

## 2016-11-10 DIAGNOSIS — Z9181 History of falling: Secondary | ICD-10-CM | POA: Diagnosis not present

## 2016-11-10 DIAGNOSIS — G20 Parkinson's disease: Secondary | ICD-10-CM | POA: Diagnosis not present

## 2016-11-10 DIAGNOSIS — F0281 Dementia in other diseases classified elsewhere with behavioral disturbance: Secondary | ICD-10-CM | POA: Diagnosis not present

## 2016-11-10 DIAGNOSIS — S72002D Fracture of unspecified part of neck of left femur, subsequent encounter for closed fracture with routine healing: Secondary | ICD-10-CM | POA: Diagnosis not present

## 2016-11-11 DIAGNOSIS — F411 Generalized anxiety disorder: Secondary | ICD-10-CM | POA: Diagnosis not present

## 2016-11-11 DIAGNOSIS — F0150 Vascular dementia without behavioral disturbance: Secondary | ICD-10-CM | POA: Diagnosis not present

## 2016-11-11 DIAGNOSIS — G20 Parkinson's disease: Secondary | ICD-10-CM | POA: Diagnosis not present

## 2016-11-12 DIAGNOSIS — S72002D Fracture of unspecified part of neck of left femur, subsequent encounter for closed fracture with routine healing: Secondary | ICD-10-CM | POA: Diagnosis not present

## 2016-11-12 DIAGNOSIS — F0281 Dementia in other diseases classified elsewhere with behavioral disturbance: Secondary | ICD-10-CM | POA: Diagnosis not present

## 2016-11-12 DIAGNOSIS — S329XXD Fracture of unspecified parts of lumbosacral spine and pelvis, subsequent encounter for fracture with routine healing: Secondary | ICD-10-CM | POA: Diagnosis not present

## 2016-11-12 DIAGNOSIS — F411 Generalized anxiety disorder: Secondary | ICD-10-CM | POA: Diagnosis not present

## 2016-11-12 DIAGNOSIS — G20 Parkinson's disease: Secondary | ICD-10-CM | POA: Diagnosis not present

## 2016-11-12 DIAGNOSIS — Z9181 History of falling: Secondary | ICD-10-CM | POA: Diagnosis not present

## 2016-11-15 DIAGNOSIS — F0281 Dementia in other diseases classified elsewhere with behavioral disturbance: Secondary | ICD-10-CM | POA: Diagnosis not present

## 2016-11-15 DIAGNOSIS — Z9181 History of falling: Secondary | ICD-10-CM | POA: Diagnosis not present

## 2016-11-15 DIAGNOSIS — F411 Generalized anxiety disorder: Secondary | ICD-10-CM | POA: Diagnosis not present

## 2016-11-15 DIAGNOSIS — G20 Parkinson's disease: Secondary | ICD-10-CM | POA: Diagnosis not present

## 2016-11-15 DIAGNOSIS — S329XXD Fracture of unspecified parts of lumbosacral spine and pelvis, subsequent encounter for fracture with routine healing: Secondary | ICD-10-CM | POA: Diagnosis not present

## 2016-11-15 DIAGNOSIS — S72002D Fracture of unspecified part of neck of left femur, subsequent encounter for closed fracture with routine healing: Secondary | ICD-10-CM | POA: Diagnosis not present

## 2016-11-16 DIAGNOSIS — M79674 Pain in right toe(s): Secondary | ICD-10-CM | POA: Diagnosis not present

## 2016-11-16 DIAGNOSIS — M79675 Pain in left toe(s): Secondary | ICD-10-CM | POA: Diagnosis not present

## 2016-11-16 DIAGNOSIS — B351 Tinea unguium: Secondary | ICD-10-CM | POA: Diagnosis not present

## 2016-11-17 DIAGNOSIS — R600 Localized edema: Secondary | ICD-10-CM | POA: Diagnosis not present

## 2016-11-17 DIAGNOSIS — R197 Diarrhea, unspecified: Secondary | ICD-10-CM | POA: Diagnosis not present

## 2016-11-18 DIAGNOSIS — F0281 Dementia in other diseases classified elsewhere with behavioral disturbance: Secondary | ICD-10-CM | POA: Diagnosis not present

## 2016-11-18 DIAGNOSIS — S72002D Fracture of unspecified part of neck of left femur, subsequent encounter for closed fracture with routine healing: Secondary | ICD-10-CM | POA: Diagnosis not present

## 2016-11-18 DIAGNOSIS — Z9181 History of falling: Secondary | ICD-10-CM | POA: Diagnosis not present

## 2016-11-18 DIAGNOSIS — G20 Parkinson's disease: Secondary | ICD-10-CM | POA: Diagnosis not present

## 2016-11-18 DIAGNOSIS — F411 Generalized anxiety disorder: Secondary | ICD-10-CM | POA: Diagnosis not present

## 2016-11-18 DIAGNOSIS — S329XXD Fracture of unspecified parts of lumbosacral spine and pelvis, subsequent encounter for fracture with routine healing: Secondary | ICD-10-CM | POA: Diagnosis not present

## 2016-11-23 DIAGNOSIS — R197 Diarrhea, unspecified: Secondary | ICD-10-CM | POA: Diagnosis not present

## 2016-11-23 DIAGNOSIS — S72002D Fracture of unspecified part of neck of left femur, subsequent encounter for closed fracture with routine healing: Secondary | ICD-10-CM | POA: Diagnosis not present

## 2016-11-23 DIAGNOSIS — Z9181 History of falling: Secondary | ICD-10-CM | POA: Diagnosis not present

## 2016-11-23 DIAGNOSIS — G20 Parkinson's disease: Secondary | ICD-10-CM | POA: Diagnosis not present

## 2016-11-23 DIAGNOSIS — F0281 Dementia in other diseases classified elsewhere with behavioral disturbance: Secondary | ICD-10-CM | POA: Diagnosis not present

## 2016-11-23 DIAGNOSIS — S329XXD Fracture of unspecified parts of lumbosacral spine and pelvis, subsequent encounter for fracture with routine healing: Secondary | ICD-10-CM | POA: Diagnosis not present

## 2016-11-23 DIAGNOSIS — F411 Generalized anxiety disorder: Secondary | ICD-10-CM | POA: Diagnosis not present

## 2016-11-24 DIAGNOSIS — E119 Type 2 diabetes mellitus without complications: Secondary | ICD-10-CM | POA: Diagnosis not present

## 2016-11-24 DIAGNOSIS — R531 Weakness: Secondary | ICD-10-CM | POA: Diagnosis not present

## 2016-11-24 DIAGNOSIS — R197 Diarrhea, unspecified: Secondary | ICD-10-CM | POA: Diagnosis not present

## 2016-11-24 DIAGNOSIS — E039 Hypothyroidism, unspecified: Secondary | ICD-10-CM | POA: Diagnosis not present

## 2016-11-24 DIAGNOSIS — K5900 Constipation, unspecified: Secondary | ICD-10-CM | POA: Diagnosis not present

## 2016-11-24 DIAGNOSIS — Z7982 Long term (current) use of aspirin: Secondary | ICD-10-CM | POA: Diagnosis not present

## 2016-11-24 DIAGNOSIS — G20 Parkinson's disease: Secondary | ICD-10-CM | POA: Diagnosis not present

## 2016-11-24 DIAGNOSIS — E782 Mixed hyperlipidemia: Secondary | ICD-10-CM | POA: Diagnosis not present

## 2016-11-24 DIAGNOSIS — D518 Other vitamin B12 deficiency anemias: Secondary | ICD-10-CM | POA: Diagnosis not present

## 2016-11-24 DIAGNOSIS — I10 Essential (primary) hypertension: Secondary | ICD-10-CM | POA: Diagnosis not present

## 2016-11-24 DIAGNOSIS — Z87440 Personal history of urinary (tract) infections: Secondary | ICD-10-CM | POA: Diagnosis not present

## 2016-11-24 DIAGNOSIS — G894 Chronic pain syndrome: Secondary | ICD-10-CM | POA: Diagnosis not present

## 2016-11-30 DIAGNOSIS — S329XXD Fracture of unspecified parts of lumbosacral spine and pelvis, subsequent encounter for fracture with routine healing: Secondary | ICD-10-CM | POA: Diagnosis not present

## 2016-11-30 DIAGNOSIS — F0281 Dementia in other diseases classified elsewhere with behavioral disturbance: Secondary | ICD-10-CM | POA: Diagnosis not present

## 2016-11-30 DIAGNOSIS — S72002D Fracture of unspecified part of neck of left femur, subsequent encounter for closed fracture with routine healing: Secondary | ICD-10-CM | POA: Diagnosis not present

## 2016-11-30 DIAGNOSIS — Z9181 History of falling: Secondary | ICD-10-CM | POA: Diagnosis not present

## 2016-11-30 DIAGNOSIS — G20 Parkinson's disease: Secondary | ICD-10-CM | POA: Diagnosis not present

## 2016-11-30 DIAGNOSIS — F411 Generalized anxiety disorder: Secondary | ICD-10-CM | POA: Diagnosis not present

## 2016-12-02 DIAGNOSIS — S72002D Fracture of unspecified part of neck of left femur, subsequent encounter for closed fracture with routine healing: Secondary | ICD-10-CM | POA: Diagnosis not present

## 2016-12-02 DIAGNOSIS — S329XXD Fracture of unspecified parts of lumbosacral spine and pelvis, subsequent encounter for fracture with routine healing: Secondary | ICD-10-CM | POA: Diagnosis not present

## 2016-12-02 DIAGNOSIS — F411 Generalized anxiety disorder: Secondary | ICD-10-CM | POA: Diagnosis not present

## 2016-12-02 DIAGNOSIS — F0281 Dementia in other diseases classified elsewhere with behavioral disturbance: Secondary | ICD-10-CM | POA: Diagnosis not present

## 2016-12-02 DIAGNOSIS — G20 Parkinson's disease: Secondary | ICD-10-CM | POA: Diagnosis not present

## 2016-12-02 DIAGNOSIS — Z9181 History of falling: Secondary | ICD-10-CM | POA: Diagnosis not present

## 2016-12-07 DIAGNOSIS — Z9181 History of falling: Secondary | ICD-10-CM | POA: Diagnosis not present

## 2016-12-07 DIAGNOSIS — G20 Parkinson's disease: Secondary | ICD-10-CM | POA: Diagnosis not present

## 2016-12-07 DIAGNOSIS — F0281 Dementia in other diseases classified elsewhere with behavioral disturbance: Secondary | ICD-10-CM | POA: Diagnosis not present

## 2016-12-07 DIAGNOSIS — S329XXD Fracture of unspecified parts of lumbosacral spine and pelvis, subsequent encounter for fracture with routine healing: Secondary | ICD-10-CM | POA: Diagnosis not present

## 2016-12-07 DIAGNOSIS — F411 Generalized anxiety disorder: Secondary | ICD-10-CM | POA: Diagnosis not present

## 2016-12-07 DIAGNOSIS — S72002D Fracture of unspecified part of neck of left femur, subsequent encounter for closed fracture with routine healing: Secondary | ICD-10-CM | POA: Diagnosis not present

## 2016-12-09 DIAGNOSIS — G20 Parkinson's disease: Secondary | ICD-10-CM | POA: Diagnosis not present

## 2016-12-09 DIAGNOSIS — F411 Generalized anxiety disorder: Secondary | ICD-10-CM | POA: Diagnosis not present

## 2016-12-09 DIAGNOSIS — F0150 Vascular dementia without behavioral disturbance: Secondary | ICD-10-CM | POA: Diagnosis not present

## 2016-12-10 DIAGNOSIS — R531 Weakness: Secondary | ICD-10-CM | POA: Diagnosis not present

## 2016-12-10 DIAGNOSIS — Z87440 Personal history of urinary (tract) infections: Secondary | ICD-10-CM | POA: Diagnosis not present

## 2016-12-10 DIAGNOSIS — K5900 Constipation, unspecified: Secondary | ICD-10-CM | POA: Diagnosis not present

## 2016-12-10 DIAGNOSIS — G20 Parkinson's disease: Secondary | ICD-10-CM | POA: Diagnosis not present

## 2016-12-10 DIAGNOSIS — Z7982 Long term (current) use of aspirin: Secondary | ICD-10-CM | POA: Diagnosis not present

## 2016-12-10 DIAGNOSIS — G894 Chronic pain syndrome: Secondary | ICD-10-CM | POA: Diagnosis not present

## 2016-12-10 DIAGNOSIS — R197 Diarrhea, unspecified: Secondary | ICD-10-CM | POA: Diagnosis not present

## 2016-12-10 DIAGNOSIS — A0471 Enterocolitis due to Clostridium difficile, recurrent: Secondary | ICD-10-CM | POA: Diagnosis not present

## 2016-12-10 DIAGNOSIS — S72002D Fracture of unspecified part of neck of left femur, subsequent encounter for closed fracture with routine healing: Secondary | ICD-10-CM | POA: Diagnosis not present

## 2016-12-10 DIAGNOSIS — S329XXD Fracture of unspecified parts of lumbosacral spine and pelvis, subsequent encounter for fracture with routine healing: Secondary | ICD-10-CM | POA: Diagnosis not present

## 2016-12-10 DIAGNOSIS — R600 Localized edema: Secondary | ICD-10-CM | POA: Diagnosis not present

## 2016-12-10 DIAGNOSIS — Z9181 History of falling: Secondary | ICD-10-CM | POA: Diagnosis not present

## 2016-12-10 DIAGNOSIS — F411 Generalized anxiety disorder: Secondary | ICD-10-CM | POA: Diagnosis not present

## 2016-12-10 DIAGNOSIS — F0281 Dementia in other diseases classified elsewhere with behavioral disturbance: Secondary | ICD-10-CM | POA: Diagnosis not present

## 2016-12-10 DIAGNOSIS — D518 Other vitamin B12 deficiency anemias: Secondary | ICD-10-CM | POA: Diagnosis not present

## 2016-12-13 DIAGNOSIS — S72002D Fracture of unspecified part of neck of left femur, subsequent encounter for closed fracture with routine healing: Secondary | ICD-10-CM | POA: Diagnosis not present

## 2016-12-13 DIAGNOSIS — Z9181 History of falling: Secondary | ICD-10-CM | POA: Diagnosis not present

## 2016-12-13 DIAGNOSIS — G20 Parkinson's disease: Secondary | ICD-10-CM | POA: Diagnosis not present

## 2016-12-13 DIAGNOSIS — F0281 Dementia in other diseases classified elsewhere with behavioral disturbance: Secondary | ICD-10-CM | POA: Diagnosis not present

## 2016-12-13 DIAGNOSIS — F411 Generalized anxiety disorder: Secondary | ICD-10-CM | POA: Diagnosis not present

## 2016-12-13 DIAGNOSIS — S329XXD Fracture of unspecified parts of lumbosacral spine and pelvis, subsequent encounter for fracture with routine healing: Secondary | ICD-10-CM | POA: Diagnosis not present

## 2016-12-16 DIAGNOSIS — Z9181 History of falling: Secondary | ICD-10-CM | POA: Diagnosis not present

## 2016-12-16 DIAGNOSIS — F411 Generalized anxiety disorder: Secondary | ICD-10-CM | POA: Diagnosis not present

## 2016-12-16 DIAGNOSIS — S72002D Fracture of unspecified part of neck of left femur, subsequent encounter for closed fracture with routine healing: Secondary | ICD-10-CM | POA: Diagnosis not present

## 2016-12-16 DIAGNOSIS — S329XXD Fracture of unspecified parts of lumbosacral spine and pelvis, subsequent encounter for fracture with routine healing: Secondary | ICD-10-CM | POA: Diagnosis not present

## 2016-12-16 DIAGNOSIS — G20 Parkinson's disease: Secondary | ICD-10-CM | POA: Diagnosis not present

## 2016-12-16 DIAGNOSIS — F0281 Dementia in other diseases classified elsewhere with behavioral disturbance: Secondary | ICD-10-CM | POA: Diagnosis not present

## 2016-12-22 DIAGNOSIS — S329XXD Fracture of unspecified parts of lumbosacral spine and pelvis, subsequent encounter for fracture with routine healing: Secondary | ICD-10-CM | POA: Diagnosis not present

## 2016-12-22 DIAGNOSIS — Z9181 History of falling: Secondary | ICD-10-CM | POA: Diagnosis not present

## 2016-12-22 DIAGNOSIS — G20 Parkinson's disease: Secondary | ICD-10-CM | POA: Diagnosis not present

## 2016-12-22 DIAGNOSIS — F0281 Dementia in other diseases classified elsewhere with behavioral disturbance: Secondary | ICD-10-CM | POA: Diagnosis not present

## 2016-12-22 DIAGNOSIS — S72002D Fracture of unspecified part of neck of left femur, subsequent encounter for closed fracture with routine healing: Secondary | ICD-10-CM | POA: Diagnosis not present

## 2016-12-22 DIAGNOSIS — F411 Generalized anxiety disorder: Secondary | ICD-10-CM | POA: Diagnosis not present

## 2016-12-27 ENCOUNTER — Encounter: Payer: Self-pay | Admitting: Neurology

## 2016-12-27 ENCOUNTER — Ambulatory Visit (INDEPENDENT_AMBULATORY_CARE_PROVIDER_SITE_OTHER): Payer: Medicare Other | Admitting: Neurology

## 2016-12-27 VITALS — BP 110/55 | HR 87

## 2016-12-27 DIAGNOSIS — R269 Unspecified abnormalities of gait and mobility: Secondary | ICD-10-CM | POA: Diagnosis not present

## 2016-12-27 DIAGNOSIS — G894 Chronic pain syndrome: Secondary | ICD-10-CM | POA: Diagnosis not present

## 2016-12-27 DIAGNOSIS — Z7982 Long term (current) use of aspirin: Secondary | ICD-10-CM | POA: Diagnosis not present

## 2016-12-27 DIAGNOSIS — F0280 Dementia in other diseases classified elsewhere without behavioral disturbance: Secondary | ICD-10-CM | POA: Diagnosis not present

## 2016-12-27 DIAGNOSIS — G20 Parkinson's disease: Secondary | ICD-10-CM

## 2016-12-27 DIAGNOSIS — A0471 Enterocolitis due to Clostridium difficile, recurrent: Secondary | ICD-10-CM | POA: Diagnosis not present

## 2016-12-27 DIAGNOSIS — R197 Diarrhea, unspecified: Secondary | ICD-10-CM | POA: Diagnosis not present

## 2016-12-27 MED ORDER — CARBIDOPA-LEVODOPA 25-100 MG PO TABS: 0.5000 | ORAL_TABLET | Freq: Three times a day (TID) | ORAL | 11 refills | Status: DC

## 2016-12-27 NOTE — Patient Instructions (Signed)
   We will stop the Seroquel tablet and reduce the Sinemet 25/100 mg tablet to 1/2 tablet three times a day.  We will start Vitamin C 500 mg three times a day.

## 2016-12-27 NOTE — Progress Notes (Signed)
Reason for visit: Parkinson's dementia  [name] Oliver is an 83 y.o. female  History of present illness:  [name] Oliver is an 83 year old right-handed white female with a history of Parkinson's disease associated with a significant dementia. The patient has moved to [facility] extended care facility in [place], [place]. The patient has had worsening of her mental status. She is still not ambulatory without assistance. She has a very definite tendency to lean backwards. She mainly will mobilize with a wheelchair. She has had frequent urinary tract infections, she has required antibiotic therapy and subsequently developed C. difficile colitis in June 2018. She is now off antibiotics for this, she is on a probiotic. The patient has had a very definite change in mental status that has not gotten back to her baseline. The patient is on Seroquel taking 12.5 mg around 4 PM for sundowning, the daughter is not sure that this has been helpful. The patient remains on Sinemet 25/100 mg tablets taking 1 twice daily. She is on cranberry tablets for urinary tract infection prevention. The patient indicates that she sleeps well at night, but she still has problems with fatigue during the day. She does not ambulate much at this time.  Past Medical History:  Diagnosis Date  . Chronic cough   . Dementia   . Hip fracture (HCC)   . Memory difficulty 01/08/2014  . Pelvis fracture (HCC)   . Primary Parkinsonism [facility])     Past Surgical History:  Procedure Laterality Date  . ABDOMINAL HYSTERECTOMY    . ABDOMINAL HYSTERECTOMY    . BREAST LUMPECTOMY Right   . CATARACT EXTRACTION Bilateral    03/14/13  . FRACTURE SURGERY    . HIP ARTHROPLASTY Left 07/15/2016   Procedure: BIPOLAR REPLACEMENT LEFT HIP;  Surgeon: Vickki Hearing, MD;  Location: [facility];  Service: Orthopedics;  Laterality: Left;  . JOINT REPLACEMENT    . ORIF HIP FRACTURE Left 07/28/2016   Procedure: Revision of bipolar hip replacement into  unipolar hip replacement;  Surgeon: Vickki Hearing, MD;  Location: [facility];  Service: Orthopedics;  Laterality: Left;  . TOTAL HIP ARTHROPLASTY Right     Family History  Problem Relation Age of Onset  . Dementia Mother   . Bipolar disorder Sister   . Parkinsonism Sister   . Diabetes Other     Social history:  reports that she has never smoked. She has never used smokeless tobacco. She reports that she does not drink alcohol or use drugs.    Allergies  Allergen Reactions  . Morphine And Related Other (See Comments)    Hallucinations  . Prednisone Other (See Comments)    manic  . Percocet [Oxycodone-Acetaminophen]     Unknown   . Namzaric [Memantine Hcl-Donepezil Hcl] Anxiety    Medications:  Prior to Admission medications   Medication Sig Start Date End Date Taking? Authorizing Provider  acetaminophen (TYLENOL) 500 MG tablet Take 1 tablet (500 mg total) by mouth every 6 (six) hours. 07/18/16  Yes Erick Blinks, MD  aspirin EC 325 MG EC tablet Take 1 tablet (325 mg total) by mouth daily with breakfast. 07/19/16  Yes Erick Blinks, MD  [employer] [place] [facility]) OINT Apply 1 application topically See admin instructions. Apply to sacrum and bilateral buttocks q shift and prn    Yes [provider]  bisacodyl (DULCOLAX) 5 MG EC tablet Take 1 tablet (5 mg total) by mouth daily as needed for moderate constipation. 08/02/16  Yes Fuller Canada  E, MD  busPIRone (BUSPAR) 5 MG tablet Take 5 mg by mouth 3 (three) times daily.   Yes [provider]  carbidopa-levodopa (SINEMET IR) 25-100 MG tablet take 1 tablet by mouth twice a day 03/18/16  Yes York SpanielWillis, Charles K, MD  Cranberry 450 MG CAPS Take 1 capsule by mouth 2 (two) times daily.   Yes [provider]  CRANBERRY PO Take 450 mg by mouth 2 (two) times daily.   Yes [provider]  docusate sodium (COLACE) 100 MG capsule Take 1 capsule (100 mg total) by mouth 2 (two) times daily. 07/18/16  Yes  Erick BlinksMemon, Jehanzeb, MD  memantine (NAMENDA) 5 MG tablet Take 1 tablet (5 mg total) by mouth 2 (two) times daily. 07/14/16  Yes York SpanielWillis, Charles K, MD  mirtazapine (REMERON) 30 MG tablet Take 1 tablet (30 mg total) by mouth at bedtime. 11/10/15  Yes York SpanielWillis, Charles K, MD  QUEtiapine (SEROQUEL) 25 MG tablet Take 0.5 tablets (12.5 mg total) by mouth daily before supper. Give around 4 pm 10/01/16  Yes York SpanielWillis, Charles K, MD  saccharomyces boulardii (FLORASTOR) 250 MG capsule Take 250 mg by mouth 2 (two) times daily.   Yes [provider]  Skin Protectants, Misc. (DIMETHICONE-ZINC OXIDE) cream Apply 1 application topically 2 (two) times daily as needed for dry skin.   Yes [provider]  traMADol (ULTRAM) 50 MG tablet Take 1/2 tablet by mouth every 6 hours as needed for moderate pain not relieved with Tylenol. 08/03/16  Yes Sharon SellerEubanks, Jessica K, NP  vitamin B-12 (CYANOCOBALAMIN) 1000 MCG tablet Take 1,000 mcg by mouth daily.   Yes [provider]    ROS:  Out of a complete 14 system review of symptoms, the patient complains only of the following symptoms, and all other reviewed systems are negative.  Memory loss Gait disorder  Blood pressure (!) 110/55, pulse 87, SpO2 95 %.  Physical Exam  General: The patient is alert and cooperative at the time of the examination.  Skin: 2-3+ edema below the knees is noted bilaterally.   Neurologic Exam  Mental status: The patient is alert and oriented x 2 at the time of the examination (not oriented to date). The Mini-Mental Status Examination done today shows a total score of 22/30.   Cranial nerves: Facial symmetry is present. Speech is normal, no aphasia or dysarthria is noted. Extraocular movements are full. Visual fields are full. Masking of the face is seen.  Motor: The patient has good strength in all 4 extremities.  Sensory examination: Soft touch sensation is symmetric on the face, arms, and legs.  Coordination: The patient  has good finger-nose-finger and heel-to-shin bilaterally. The patient does have some apraxia with the use of the lower extremities.  Gait and station: The patient requires assistance with standing, she has difficulty extending the knees, she will walk with knees flexed. The patient has a tendency to lean backwards. She cannot walk without assistance. She takes short shuffling steps. Tandem gait was not tested. Romberg is positive, the patient will fall backwards.  Reflexes: Deep tendon reflexes are symmetric.   Assessment/Plan:  1. Parkinson's disease  2. Dementia  3. Gait disturbance  The patient has had worsening mental status following urinary tract infections and C. difficile colitis. The Seroquel will be discontinued, the Sinemet will be reduced taking one half tablet 3 times daily of the 25/100 mg tablets. The patient will go on vitamin C 500 mg twice daily to help prevent urinary tract infections. The  patient will follow-up in 4 months. If the mental status worsens off of Seroquel, the patient's daughter will call me.  Marlan Palau MD 12/27/2016 9:40 AM  [facility] [address] [place], [place] [address]  Phone [phone] Fax [phone]

## 2016-12-30 ENCOUNTER — Emergency Department (HOSPITAL_COMMUNITY): Payer: Medicare Other

## 2016-12-30 ENCOUNTER — Emergency Department (HOSPITAL_COMMUNITY)
Admission: EM | Admit: 2016-12-30 | Discharge: 2016-12-30 | Payer: Medicare Other | Attending: Emergency Medicine | Admitting: Emergency Medicine

## 2016-12-30 ENCOUNTER — Encounter (HOSPITAL_COMMUNITY): Payer: Self-pay | Admitting: Emergency Medicine

## 2016-12-30 DIAGNOSIS — Z7982 Long term (current) use of aspirin: Secondary | ICD-10-CM | POA: Insufficient documentation

## 2016-12-30 DIAGNOSIS — Y939 Activity, unspecified: Secondary | ICD-10-CM | POA: Diagnosis not present

## 2016-12-30 DIAGNOSIS — Z96643 Presence of artificial hip joint, bilateral: Secondary | ICD-10-CM | POA: Insufficient documentation

## 2016-12-30 DIAGNOSIS — S72402A Unspecified fracture of lower end of left femur, initial encounter for closed fracture: Secondary | ICD-10-CM | POA: Insufficient documentation

## 2016-12-30 DIAGNOSIS — G20 Parkinson's disease: Secondary | ICD-10-CM | POA: Diagnosis not present

## 2016-12-30 DIAGNOSIS — S79822A Other specified injuries of left thigh, initial encounter: Secondary | ICD-10-CM | POA: Diagnosis present

## 2016-12-30 DIAGNOSIS — Y92129 Unspecified place in nursing home as the place of occurrence of the external cause: Secondary | ICD-10-CM | POA: Diagnosis not present

## 2016-12-30 DIAGNOSIS — W19XXXA Unspecified fall, initial encounter: Secondary | ICD-10-CM | POA: Diagnosis not present

## 2016-12-30 DIAGNOSIS — Y999 Unspecified external cause status: Secondary | ICD-10-CM | POA: Diagnosis not present

## 2016-12-30 DIAGNOSIS — F0280 Dementia in other diseases classified elsewhere without behavioral disturbance: Secondary | ICD-10-CM | POA: Insufficient documentation

## 2016-12-30 DIAGNOSIS — W1839XA Other fall on same level, initial encounter: Secondary | ICD-10-CM | POA: Diagnosis not present

## 2016-12-30 DIAGNOSIS — S72022A Displaced fracture of epiphysis (separation) (upper) of left femur, initial encounter for closed fracture: Secondary | ICD-10-CM | POA: Diagnosis not present

## 2016-12-30 DIAGNOSIS — M79605 Pain in left leg: Secondary | ICD-10-CM | POA: Diagnosis not present

## 2016-12-30 DIAGNOSIS — S72352A Displaced comminuted fracture of shaft of left femur, initial encounter for closed fracture: Secondary | ICD-10-CM | POA: Diagnosis not present

## 2016-12-30 DIAGNOSIS — Y998 Other external cause status: Secondary | ICD-10-CM | POA: Diagnosis not present

## 2016-12-30 DIAGNOSIS — S7222XA Displaced subtrochanteric fracture of left femur, initial encounter for closed fracture: Secondary | ICD-10-CM | POA: Diagnosis not present

## 2016-12-30 DIAGNOSIS — T148XXA Other injury of unspecified body region, initial encounter: Secondary | ICD-10-CM | POA: Diagnosis not present

## 2016-12-30 DIAGNOSIS — S299XXA Unspecified injury of thorax, initial encounter: Secondary | ICD-10-CM | POA: Diagnosis not present

## 2016-12-30 DIAGNOSIS — Z471 Aftercare following joint replacement surgery: Secondary | ICD-10-CM | POA: Diagnosis not present

## 2016-12-30 DIAGNOSIS — R531 Weakness: Secondary | ICD-10-CM | POA: Diagnosis not present

## 2016-12-30 DIAGNOSIS — G3183 Dementia with Lewy bodies: Secondary | ICD-10-CM | POA: Diagnosis not present

## 2016-12-30 DIAGNOSIS — R2242 Localized swelling, mass and lump, left lower limb: Secondary | ICD-10-CM | POA: Diagnosis not present

## 2016-12-30 DIAGNOSIS — Z96642 Presence of left artificial hip joint: Secondary | ICD-10-CM | POA: Diagnosis not present

## 2016-12-30 DIAGNOSIS — Z66 Do not resuscitate: Secondary | ICD-10-CM | POA: Diagnosis not present

## 2016-12-30 DIAGNOSIS — D62 Acute posthemorrhagic anemia: Secondary | ICD-10-CM | POA: Diagnosis not present

## 2016-12-30 DIAGNOSIS — Z043 Encounter for examination and observation following other accident: Secondary | ICD-10-CM | POA: Diagnosis not present

## 2016-12-30 DIAGNOSIS — M9702XA Periprosthetic fracture around internal prosthetic left hip joint, initial encounter: Secondary | ICD-10-CM | POA: Diagnosis not present

## 2016-12-30 LAB — COMPREHENSIVE METABOLIC PANEL
ALBUMIN: 3.8 g/dL (ref 3.5–5.0)
ALK PHOS: 92 U/L (ref 38–126)
ALT: 7 U/L — AB (ref 14–54)
AST: 20 U/L (ref 15–41)
Anion gap: 9 (ref 5–15)
BILIRUBIN TOTAL: 0.6 mg/dL (ref 0.3–1.2)
BUN: 13 mg/dL (ref 6–20)
CO2: 25 mmol/L (ref 22–32)
Calcium: 9 mg/dL (ref 8.9–10.3)
Chloride: 106 mmol/L (ref 101–111)
Creatinine, Ser: 0.58 mg/dL (ref 0.44–1.00)
GFR calc Af Amer: >60 mL/min (ref >60)
GFR calc non Af Amer: >60 mL/min (ref >60)
GLUCOSE: 118 mg/dL — AB (ref 65–99)
POTASSIUM: 3.4 mmol/L — AB (ref 3.5–5.1)
Sodium: 140 mmol/L (ref 135–145)
TOTAL PROTEIN: 6.5 g/dL (ref 6.5–8.1)

## 2016-12-30 LAB — CBC WITH DIFFERENTIAL/PLATELET
BASOS ABS: 0 10*3/uL (ref 0.0–0.1)
BASOS PCT: 0 %
Eosinophils Absolute: 0.2 10*3/uL (ref 0.0–0.7)
Eosinophils Relative: 2 %
HEMATOCRIT: 34.7 % — AB (ref 36.0–46.0)
HEMOGLOBIN: 11.3 g/dL — AB (ref 12.0–15.0)
Lymphocytes Relative: 17 %
Lymphs Abs: 1.6 10*3/uL (ref 0.7–4.0)
MCH: 32 pg (ref 26.0–34.0)
MCHC: 32.6 g/dL (ref 30.0–36.0)
MCV: 98.3 fL (ref 78.0–100.0)
Monocytes Absolute: 0.7 10*3/uL (ref 0.1–1.0)
Monocytes Relative: 8 %
NEUTROS ABS: 6.9 10*3/uL (ref 1.7–7.7)
NEUTROS PCT: 73 %
Platelets: 383 10*3/uL (ref 150–400)
RBC: 3.53 MIL/uL — AB (ref 3.87–5.11)
RDW: 14.1 % (ref 11.5–15.5)
WBC: 9.5 10*3/uL (ref 4.0–10.5)

## 2016-12-30 MED ORDER — FENTANYL CITRATE (PF) 100 MCG/2ML IJ SOLN
50.0000 ug | Freq: Once | INTRAMUSCULAR | Status: AC
Start: 2016-12-30 — End: 2016-12-30
  Administered 2016-12-30: 50 ug via INTRAVENOUS
  Filled 2016-12-30: qty 2

## 2016-12-30 MED ORDER — FENTANYL CITRATE (PF) 100 MCG/2ML IJ SOLN
50.0000 ug | Freq: Once | INTRAMUSCULAR | Status: AC
  Administered 2016-12-30: 50 ug via INTRAVENOUS
  Filled 2016-12-30: qty 2

## 2016-12-30 NOTE — ED Notes (Signed)
Pt wheeled out by [facility]. Pts family verbalized understanding of plan of care.

## 2016-12-30 NOTE — ED Triage Notes (Signed)
Pt from [facility].  Found in floor in room from unwitnessed fall.  Lt leg shorter than rt.  Pt grimaces and moans when being moved.

## 2016-12-30 NOTE — ED Provider Notes (Signed)
[facility]-EMER[facility]ENC[facility] DEPT Provider Note   CSN: [id number] Arrival date & time: 12/30/16  1618     History   Chief Complaint Chief Complaint  Patient presents with  . Leg Injury    HPI [name] Oliver is a 83 y.o. female.  HPI Patient presents with fall from a nursing home. History of dementia and is unable to contribute history.Level V caveat applies. Found on floor by nursing home staff. Complaining of left upper leg pain. Left upper leg noted to be shortened by EMS.  Past Medical History:  Diagnosis Date  . Chronic cough   . Dementia   . Hip fracture (HCC)   . Memory difficulty 01/08/2014  . Pelvis fracture (HCC)   . Primary Parkinsonism [facility](HCC)     Patient Active Problem List   Diagnosis Date Noted  . Left leg DVT (HCC) 08/12/2016  . Periprosthetic fracture around internal prosthetic hip joint 07/26/2016  . Anemia 07/16/2016  . Femoral neck fracture (HCC) 07/15/2016  . Closed fracture of neck of left femur (HCC)   . Chronic fatigue 06/14/2016  . Memory difficulty 01/08/2014  . Parkinson's disease dementia (HCC) 10/28/2013  . Abnormality of gait 09/26/2013  . Parkinsonism (HCC) 09/26/2013  . Urinary tract infection without hematuria 09/24/2013  . Urinary retention 09/23/2013  . Unspecified constipation 09/23/2013  . Accelerated hypertension 09/20/2013  . Fall 09/19/2013  . Pelvic fracture (HCC) 09/19/2013  . Leukocytosis 09/19/2013    Past Surgical History:  Procedure Laterality Date  . ABDOMINAL HYSTERECTOMY    . ABDOMINAL HYSTERECTOMY    . BREAST LUMPECTOMY Right   . CATARACT EXTRACTION Bilateral    03/14/13  . FRACTURE SURGERY    . HIP ARTHROPLASTY Left 07/15/2016   Procedure: BIPOLAR REPLACEMENT LEFT HIP;  Surgeon: Vickki HearingStanley E Harrison, MD;  Location: [facility] ORS;  Service: Orthopedics;  Laterality: Left;  . JOINT REPLACEMENT    . ORIF HIP FRACTURE Left 07/28/2016   Procedure: Revision of bipolar hip replacement into unipolar hip replacement;  Surgeon: Vickki HearingStanley E Harrison,  MD;  Location: [facility] ORS;  Service: Orthopedics;  Laterality: Left;  . TOTAL HIP ARTHROPLASTY Right     OB History    Gravida Para Term Preterm AB Living   3 2 2   1      SAB TAB Ectopic Multiple Live Births   1               Home Medications    Prior to Admission medications   Medication Sig Start Date End Date Taking? Authorizing Provider  acetaminophen (TYLENOL) 500 MG tablet Take 1 tablet (500 mg total) by mouth every 6 (six) hours. 07/18/16  Yes Erick BlinksMemon, Jehanzeb, MD  aspirin EC 325 MG EC tablet Take 1 tablet (325 mg total) by mouth daily with breakfast. 07/19/16  Yes Memon, Durward MallardJehanzeb, MD  busPIRone (BUSPAR) 5 MG tablet Take 5 mg by mouth 3 (three) times daily.   Yes [provider]  carbidopa-levodopa (SINEMET IR) 25-100 MG tablet Take 0.5 tablets by mouth 3 (three) times daily.   Yes [provider]  Cranberry 450 MG CAPS Take 1 capsule by mouth 2 (two) times daily.   Yes [provider]  memantine (NAMENDA) 5 MG tablet Take 1 tablet (5 mg total) by mouth 2 (two) times daily. 07/14/16  Yes York SpanielWillis, Charles K, MD  mirtazapine (REMERON) 30 MG tablet Take 1 tablet (30 mg total) by mouth at bedtime. 11/10/15  Yes York SpanielWillis, Charles K, MD  saccharomyces boulardii (FLORASTOR) 250 MG capsule  Take 250 mg by mouth 3 (three) times daily.    Yes [provider]  Skin Protectants, Misc. (DIMETHICONE-ZINC OXIDE) cream Apply 1 application topically 2 (two) times daily as needed for dry skin.   Yes [provider]  vitamin B-12 (CYANOCOBALAMIN) 1000 MCG tablet Take 1,000 mcg by mouth daily.   Yes [provider]  vitamin C (ASCORBIC ACID) 500 MG tablet Take 500 mg by mouth 2 (two) times daily.   Yes [provider]  bisacodyl (DULCOLAX) 5 MG EC tablet Take 1 tablet (5 mg total) by mouth daily as needed for moderate constipation. 08/02/16   Vickki HearingHarrison, Stanley E, MD    Family History Family History  Problem Relation Age of Onset  . Dementia Mother     . Bipolar disorder Sister   . Parkinsonism Sister   . Diabetes Other     Social History Social History  Substance Use Topics  . Smoking status: Never Smoker  . Smokeless tobacco: Never Used  . Alcohol use No     Allergies   Morphine and related; Prednisone; Percocet [oxycodone-acetaminophen]; and Namzaric [memantine hcl-donepezil hcl]   Review of Systems Review of Systems  Unable to perform ROS: Dementia     Physical Exam Updated Vital Signs BP (!) 165/87   Pulse 81   Temp 98 F (36.7 C) (Oral)   Resp 11   Ht 5\' 4"  (1.626 m)   Wt 55.8 kg (123 lb)   SpO2 97%   BMI 21.11 kg/m   Physical Exam  Constitutional: She appears well-developed and well-nourished. No distress.  HENT:  Head: Normocephalic and atraumatic.  Mouth/Throat: Oropharynx is clear and moist.  No obvious head injury.  Eyes: Pupils are equal, round, and reactive to light. EOM are normal.  Neck: Normal range of motion. Neck supple.  No posterior midline cervical tenderness to palpation.  Cardiovascular: Normal rate and regular rhythm.  Exam reveals no gallop and no friction rub.   No murmur heard. Pulmonary/Chest: Effort normal and breath sounds normal. No respiratory distress. She has no wheezes. She has no rales. She exhibits no tenderness.  Abdominal: Soft. Bowel sounds are normal. There is no tenderness. There is no rebound and no guarding.  Musculoskeletal: Normal range of motion. She exhibits tenderness. She exhibits no edema.  Patient with swelling to the distal left femur with tenderness to palpation. No apparent tenderness in the knee or left hip. She has bilateral lower extremity edema left greater than right.  Neurological: She is alert.  She is moving the toes of her left foot and right foot. Sensation to light touch appears to be intact.  Skin: Skin is warm and dry. No rash noted. No erythema.  Psychiatric: She has a normal mood and affect. Her behavior is normal.  Nursing note and  vitals reviewed.    ED Treatments / Results  Labs (all labs ordered are listed, but only abnormal results are displayed) Labs Reviewed  CBC WITH DIFFERENTIAL/PLATELET - Abnormal; Notable for the following:       Result Value   RBC 3.53 (*)    Hemoglobin 11.3 (*)    HCT 34.7 (*)    All other components within normal limits  COMPREHENSIVE METABOLIC PANEL - Abnormal; Notable for the following:    Potassium 3.4 (*)    Glucose, Bld 118 (*)    ALT 7 (*)    All other components within normal limits  URINALYSIS, ROUTINE W REFLEX MICROSCOPIC    EKG  EKG  Interpretation None       Radiology Dg Chest Port 1 View  Result Date: 12/30/2016 CLINICAL DATA:  Unwitnessed fall at nursing home. EXAM: PORTABLE CHEST 1 VIEW COMPARISON:  July 23, 2016 FINDINGS: The heart size and mediastinal contours are within normal limits. There is no focal infiltrate, pulmonary edema, or pleural effusion. Degenerative joint changes of bilateral shoulders are noted. IMPRESSION: No active cardiopulmonary disease. Electronically Signed   By: Sherian Rein M.D.   On: 12/30/2016 17:28   Dg Femur Portable Min 2 Views Left  Result Date: 12/30/2016 CLINICAL DATA:  Found on the floor, unwitnessed fall, LEFT leg shorter than RIGHT EXAM: LEFT FEMUR PORTABLE 2 VIEWS COMPARISON:  Portable exam 1700 hours compared to 07/28/2016 LEFT hip radiographs and pelvic radiograph 10/18/2016 FINDINGS: Diffuse osseous demineralization. LEFT hip prosthesis with cerclage wires. Comminuted displaced and angulated fracture of the femoral diaphysis distal to the tip of the femoral component of the LEFT hip prosthesis with nearly 1 shaft width displacement and significant overriding. No hip dislocation. Knee alignment is suboptimally visualized due to positioning and superimposed artifacts, grossly normally aligned. Integrity of the lateral femoral condyle cannot be established due to artifacts. Curvilinear lucency projects over the distal  femoral metaphysis cannot exclude an additional fracture plane distal extent of which is not clearly visualized. Old RIGHT inferior pubic ramus fracture with new visualized pelvis showing no acute fractures. IMPRESSION: Displaced angulated and over riding LEFT femoral diaphyseal fracture distal to the tip of the femoral component of the LEFT hip prosthesis. Suboptimal assessment of the distal femur especially the lateral femoral condyle due to artifact and positioning as discussed above ; recommend dedicated knee radiographs to assess distal femoral integrity. No definite additional focal bony abnormality seen. Electronically Signed   By: Ulyses Southward M.D.   On: 12/30/2016 17:32    Procedures Procedures (including critical care time)  Medications Ordered in ED Medications  fentaNYL (SUBLIMAZE) injection 50 mcg (50 mcg Intravenous Given 12/30/16 1729)  fentaNYL (SUBLIMAZE) injection 50 mcg (50 mcg Intravenous Given 12/30/16 1849)     Initial Impression / Assessment and Plan / ED Course  I have reviewed the triage vital signs and the nursing notes.  Pertinent labs & imaging results that were available during my care of the patient were reviewed by me and considered in my medical decision making (see chart for details).    Discussed with Dr. Romeo Apple who reviewed the patient's films. States he will be unable to repair the fracture. Discussed with Dr. Williams Che, orthopedist at [facility]. Will accept patient in transfer. Also spoke with Dr. Lyla Son, emergency physician. He is aware that the patient is coming to the emergency department. Patient remains hemodynamically stable. Left lower extremity is warm and pulses are dopplerable. No evidence of any other injury at this time.   Final Clinical Impressions(s) / ED Diagnoses   Final diagnoses:  Unspecified fracture of lower end of left femur, initial encounter for closed fracture [facility])    New Prescriptions New Prescriptions   No medications on file      Loren Racer, MD 12/30/16 1906

## 2016-12-31 DIAGNOSIS — S7222XA Displaced subtrochanteric fracture of left femur, initial encounter for closed fracture: Secondary | ICD-10-CM | POA: Diagnosis not present

## 2016-12-31 DIAGNOSIS — Z79899 Other long term (current) drug therapy: Secondary | ICD-10-CM | POA: Diagnosis not present

## 2016-12-31 DIAGNOSIS — L89152 Pressure ulcer of sacral region, stage 2: Secondary | ICD-10-CM | POA: Diagnosis not present

## 2016-12-31 DIAGNOSIS — Z9889 Other specified postprocedural states: Secondary | ICD-10-CM | POA: Diagnosis not present

## 2016-12-31 DIAGNOSIS — Z7409 Other reduced mobility: Secondary | ICD-10-CM | POA: Diagnosis not present

## 2016-12-31 DIAGNOSIS — G8918 Other acute postprocedural pain: Secondary | ICD-10-CM | POA: Diagnosis not present

## 2016-12-31 DIAGNOSIS — M9702XA Periprosthetic fracture around internal prosthetic left hip joint, initial encounter: Secondary | ICD-10-CM | POA: Diagnosis not present

## 2016-12-31 DIAGNOSIS — M6281 Muscle weakness (generalized): Secondary | ICD-10-CM | POA: Diagnosis not present

## 2016-12-31 DIAGNOSIS — S72352A Displaced comminuted fracture of shaft of left femur, initial encounter for closed fracture: Secondary | ICD-10-CM | POA: Diagnosis not present

## 2016-12-31 DIAGNOSIS — Z9181 History of falling: Secondary | ICD-10-CM | POA: Diagnosis not present

## 2016-12-31 DIAGNOSIS — Z96642 Presence of left artificial hip joint: Secondary | ICD-10-CM | POA: Diagnosis not present

## 2016-12-31 DIAGNOSIS — G20 Parkinson's disease: Secondary | ICD-10-CM | POA: Diagnosis not present

## 2016-12-31 DIAGNOSIS — A0472 Enterocolitis due to Clostridium difficile, not specified as recurrent: Secondary | ICD-10-CM | POA: Diagnosis not present

## 2016-12-31 DIAGNOSIS — E869 Volume depletion, unspecified: Secondary | ICD-10-CM | POA: Diagnosis not present

## 2016-12-31 DIAGNOSIS — M8580 Other specified disorders of bone density and structure, unspecified site: Secondary | ICD-10-CM | POA: Diagnosis not present

## 2016-12-31 DIAGNOSIS — Z4789 Encounter for other orthopedic aftercare: Secondary | ICD-10-CM | POA: Diagnosis not present

## 2016-12-31 DIAGNOSIS — R4182 Altered mental status, unspecified: Secondary | ICD-10-CM | POA: Diagnosis not present

## 2016-12-31 DIAGNOSIS — I444 Left anterior fascicular block: Secondary | ICD-10-CM | POA: Diagnosis not present

## 2016-12-31 DIAGNOSIS — G3183 Dementia with Lewy bodies: Secondary | ICD-10-CM | POA: Diagnosis not present

## 2016-12-31 DIAGNOSIS — E860 Dehydration: Secondary | ICD-10-CM | POA: Diagnosis not present

## 2016-12-31 DIAGNOSIS — Z66 Do not resuscitate: Secondary | ICD-10-CM | POA: Diagnosis not present

## 2016-12-31 DIAGNOSIS — Z471 Aftercare following joint replacement surgery: Secondary | ICD-10-CM | POA: Diagnosis not present

## 2016-12-31 DIAGNOSIS — Z87440 Personal history of urinary (tract) infections: Secondary | ICD-10-CM | POA: Diagnosis not present

## 2016-12-31 DIAGNOSIS — R296 Repeated falls: Secondary | ICD-10-CM | POA: Diagnosis not present

## 2016-12-31 DIAGNOSIS — D62 Acute posthemorrhagic anemia: Secondary | ICD-10-CM | POA: Diagnosis not present

## 2016-12-31 DIAGNOSIS — M9702XD Periprosthetic fracture around internal prosthetic left hip joint, subsequent encounter: Secondary | ICD-10-CM | POA: Diagnosis not present

## 2016-12-31 DIAGNOSIS — F0280 Dementia in other diseases classified elsewhere without behavioral disturbance: Secondary | ICD-10-CM | POA: Diagnosis not present

## 2016-12-31 DIAGNOSIS — R262 Difficulty in walking, not elsewhere classified: Secondary | ICD-10-CM | POA: Diagnosis not present

## 2016-12-31 DIAGNOSIS — F05 Delirium due to known physiological condition: Secondary | ICD-10-CM | POA: Diagnosis not present

## 2017-01-04 DIAGNOSIS — D62 Acute posthemorrhagic anemia: Secondary | ICD-10-CM | POA: Diagnosis not present

## 2017-01-04 DIAGNOSIS — F05 Delirium due to known physiological condition: Secondary | ICD-10-CM | POA: Diagnosis not present

## 2017-01-04 DIAGNOSIS — Z7409 Other reduced mobility: Secondary | ICD-10-CM | POA: Diagnosis not present

## 2017-01-04 DIAGNOSIS — R4182 Altered mental status, unspecified: Secondary | ICD-10-CM | POA: Diagnosis not present

## 2017-01-04 DIAGNOSIS — G3183 Dementia with Lewy bodies: Secondary | ICD-10-CM | POA: Diagnosis not present

## 2017-01-04 DIAGNOSIS — S72352A Displaced comminuted fracture of shaft of left femur, initial encounter for closed fracture: Secondary | ICD-10-CM | POA: Diagnosis not present

## 2017-01-04 DIAGNOSIS — Z9889 Other specified postprocedural states: Secondary | ICD-10-CM | POA: Diagnosis not present

## 2017-01-04 DIAGNOSIS — A0472 Enterocolitis due to Clostridium difficile, not specified as recurrent: Secondary | ICD-10-CM | POA: Diagnosis not present

## 2017-01-04 DIAGNOSIS — Z4789 Encounter for other orthopedic aftercare: Secondary | ICD-10-CM | POA: Diagnosis not present

## 2017-01-04 DIAGNOSIS — M9702XD Periprosthetic fracture around internal prosthetic left hip joint, subsequent encounter: Secondary | ICD-10-CM | POA: Diagnosis not present

## 2017-01-04 DIAGNOSIS — R262 Difficulty in walking, not elsewhere classified: Secondary | ICD-10-CM | POA: Diagnosis not present

## 2017-01-04 DIAGNOSIS — M9702XA Periprosthetic fracture around internal prosthetic left hip joint, initial encounter: Secondary | ICD-10-CM | POA: Diagnosis not present

## 2017-01-04 DIAGNOSIS — G20 Parkinson's disease: Secondary | ICD-10-CM | POA: Diagnosis not present

## 2017-01-04 DIAGNOSIS — M6281 Muscle weakness (generalized): Secondary | ICD-10-CM | POA: Diagnosis not present

## 2017-01-04 DIAGNOSIS — F0280 Dementia in other diseases classified elsewhere without behavioral disturbance: Secondary | ICD-10-CM | POA: Diagnosis not present

## 2017-01-04 DIAGNOSIS — Z9181 History of falling: Secondary | ICD-10-CM | POA: Diagnosis not present

## 2017-01-04 DIAGNOSIS — Z471 Aftercare following joint replacement surgery: Secondary | ICD-10-CM | POA: Diagnosis not present

## 2017-01-04 DIAGNOSIS — L89152 Pressure ulcer of sacral region, stage 2: Secondary | ICD-10-CM | POA: Diagnosis not present

## 2017-01-04 DIAGNOSIS — M8580 Other specified disorders of bone density and structure, unspecified site: Secondary | ICD-10-CM | POA: Diagnosis not present

## 2017-01-10 DIAGNOSIS — R41841 Cognitive communication deficit: Secondary | ICD-10-CM | POA: Diagnosis not present

## 2017-01-10 DIAGNOSIS — Z9181 History of falling: Secondary | ICD-10-CM | POA: Diagnosis not present

## 2017-01-10 DIAGNOSIS — E559 Vitamin D deficiency, unspecified: Secondary | ICD-10-CM | POA: Diagnosis not present

## 2017-01-10 DIAGNOSIS — A0472 Enterocolitis due to Clostridium difficile, not specified as recurrent: Secondary | ICD-10-CM | POA: Diagnosis not present

## 2017-01-10 MED FILL — Fentanyl Citrate Preservative Free (PF) Inj 100 MCG/2ML: INTRAMUSCULAR | Qty: 2 | Status: AC

## 2017-01-12 ENCOUNTER — Telehealth: Payer: Self-pay | Admitting: Orthopedic Surgery

## 2017-01-12 NOTE — Telephone Encounter (Signed)
Patient's daughter, Huntley Estellenna Jane Daniel, ph# [phone], called to ask if Dr Romeo AppleHarrison could possibly do the follow up/post operative care for her mother. States Dr Romeo AppleHarrison referred [facility] to [facility], status/post fall (third time), and per Emergency room visit 12/30/16, as not possible to be done locally or in [place]. States patient did have [facility].  No post operative appointment yet; awaiting paperwork and instructions from [facility], and/or  [facility], where patient resides.  Please advise.

## 2017-01-12 NOTE — Telephone Encounter (Signed)
Yes IF the doctor at [facility] sends weitten permission for me to do so

## 2017-01-13 NOTE — Telephone Encounter (Signed)
Called back to patient's daughter/POA, and relayed per Dr Mort SawyersHarrison's response.  She will request this information as well as any other pertinent medical records/reports in order to move forward.

## 2017-01-14 ENCOUNTER — Ambulatory Visit (HOSPITAL_COMMUNITY)
Admission: RE | Admit: 2017-01-14 | Discharge: 2017-01-14 | Disposition: A | Discharge status: Home / Self Care | Payer: No Typology Code available for payment source | Source: Ambulatory Visit | Attending: Internal Medicine | Admitting: Internal Medicine

## 2017-01-14 ENCOUNTER — Other Ambulatory Visit (HOSPITAL_COMMUNITY): Payer: Self-pay | Admitting: Internal Medicine

## 2017-01-14 DIAGNOSIS — S7292XA Unspecified fracture of left femur, initial encounter for closed fracture: Secondary | ICD-10-CM | POA: Diagnosis not present

## 2017-01-14 DIAGNOSIS — M79606 Pain in leg, unspecified: Secondary | ICD-10-CM | POA: Insufficient documentation

## 2017-01-14 DIAGNOSIS — Z96642 Presence of left artificial hip joint: Secondary | ICD-10-CM | POA: Diagnosis not present

## 2017-01-14 DIAGNOSIS — S72302D Unspecified fracture of shaft of left femur, subsequent encounter for closed fracture with routine healing: Secondary | ICD-10-CM | POA: Diagnosis not present

## 2017-01-14 DIAGNOSIS — X58XXXD Exposure to other specified factors, subsequent encounter: Secondary | ICD-10-CM | POA: Diagnosis not present

## 2017-01-14 DIAGNOSIS — R52 Pain, unspecified: Secondary | ICD-10-CM

## 2017-01-14 DIAGNOSIS — M79605 Pain in left leg: Secondary | ICD-10-CM | POA: Diagnosis not present

## 2017-01-14 DIAGNOSIS — S8992XA Unspecified injury of left lower leg, initial encounter: Secondary | ICD-10-CM | POA: Diagnosis not present

## 2017-01-20 DIAGNOSIS — L89310 Pressure ulcer of right buttock, unstageable: Secondary | ICD-10-CM | POA: Diagnosis not present

## 2017-01-20 DIAGNOSIS — L98419 Non-pressure chronic ulcer of buttock with unspecified severity: Secondary | ICD-10-CM | POA: Diagnosis not present

## 2017-01-21 DIAGNOSIS — S72352D Displaced comminuted fracture of shaft of left femur, subsequent encounter for closed fracture with routine healing: Secondary | ICD-10-CM | POA: Diagnosis not present

## 2017-01-24 DIAGNOSIS — L89313 Pressure ulcer of right buttock, stage 3: Secondary | ICD-10-CM | POA: Diagnosis not present

## 2017-01-24 DIAGNOSIS — L98419 Non-pressure chronic ulcer of buttock with unspecified severity: Secondary | ICD-10-CM | POA: Diagnosis not present

## 2017-01-27 DIAGNOSIS — F418 Other specified anxiety disorders: Secondary | ICD-10-CM | POA: Diagnosis not present

## 2017-01-27 DIAGNOSIS — Z9181 History of falling: Secondary | ICD-10-CM | POA: Diagnosis not present

## 2017-01-27 DIAGNOSIS — A0472 Enterocolitis due to Clostridium difficile, not specified as recurrent: Secondary | ICD-10-CM | POA: Diagnosis not present

## 2017-01-27 DIAGNOSIS — R41841 Cognitive communication deficit: Secondary | ICD-10-CM | POA: Diagnosis not present

## 2017-01-28 DIAGNOSIS — I517 Cardiomegaly: Secondary | ICD-10-CM | POA: Diagnosis not present

## 2017-01-28 DIAGNOSIS — R918 Other nonspecific abnormal finding of lung field: Secondary | ICD-10-CM | POA: Diagnosis not present

## 2017-01-28 DIAGNOSIS — R0989 Other specified symptoms and signs involving the circulatory and respiratory systems: Secondary | ICD-10-CM | POA: Diagnosis not present

## 2017-01-31 ENCOUNTER — Emergency Department (HOSPITAL_COMMUNITY): Payer: Medicare Other

## 2017-01-31 ENCOUNTER — Encounter (HOSPITAL_COMMUNITY): Payer: Self-pay

## 2017-01-31 ENCOUNTER — Emergency Department (HOSPITAL_COMMUNITY)
Admission: EM | Admit: 2017-01-31 | Discharge: 2017-01-31 | Disposition: A | Discharge status: Home / Self Care | Payer: Medicare Other | Attending: Emergency Medicine | Admitting: Emergency Medicine

## 2017-01-31 DIAGNOSIS — Z96643 Presence of artificial hip joint, bilateral: Secondary | ICD-10-CM | POA: Insufficient documentation

## 2017-01-31 DIAGNOSIS — E860 Dehydration: Secondary | ICD-10-CM | POA: Insufficient documentation

## 2017-01-31 DIAGNOSIS — F0390 Unspecified dementia without behavioral disturbance: Secondary | ICD-10-CM | POA: Diagnosis not present

## 2017-01-31 DIAGNOSIS — Z7982 Long term (current) use of aspirin: Secondary | ICD-10-CM | POA: Insufficient documentation

## 2017-01-31 DIAGNOSIS — L97829 Non-pressure chronic ulcer of other part of left lower leg with unspecified severity: Secondary | ICD-10-CM | POA: Diagnosis not present

## 2017-01-31 DIAGNOSIS — G20 Parkinson's disease: Secondary | ICD-10-CM | POA: Diagnosis not present

## 2017-01-31 DIAGNOSIS — Z7401 Bed confinement status: Secondary | ICD-10-CM | POA: Diagnosis not present

## 2017-01-31 DIAGNOSIS — E876 Hypokalemia: Secondary | ICD-10-CM | POA: Diagnosis not present

## 2017-01-31 DIAGNOSIS — D72829 Elevated white blood cell count, unspecified: Secondary | ICD-10-CM | POA: Diagnosis not present

## 2017-01-31 DIAGNOSIS — K8020 Calculus of gallbladder without cholecystitis without obstruction: Secondary | ICD-10-CM | POA: Diagnosis not present

## 2017-01-31 DIAGNOSIS — R509 Fever, unspecified: Secondary | ICD-10-CM | POA: Diagnosis present

## 2017-01-31 DIAGNOSIS — R52 Pain, unspecified: Secondary | ICD-10-CM

## 2017-01-31 DIAGNOSIS — R279 Unspecified lack of coordination: Secondary | ICD-10-CM | POA: Diagnosis not present

## 2017-01-31 DIAGNOSIS — L89313 Pressure ulcer of right buttock, stage 3: Secondary | ICD-10-CM | POA: Diagnosis not present

## 2017-01-31 DIAGNOSIS — Z79899 Other long term (current) drug therapy: Secondary | ICD-10-CM | POA: Insufficient documentation

## 2017-01-31 DIAGNOSIS — E875 Hyperkalemia: Secondary | ICD-10-CM | POA: Diagnosis not present

## 2017-01-31 DIAGNOSIS — J189 Pneumonia, unspecified organism: Secondary | ICD-10-CM | POA: Diagnosis not present

## 2017-01-31 HISTORY — DX: Enterocolitis due to Clostridium difficile, not specified as recurrent: A04.72

## 2017-01-31 HISTORY — DX: Other specified disorders of bone density and structure, unspecified shoulder: M85.819

## 2017-01-31 HISTORY — DX: Dysphagia, unspecified: R13.10

## 2017-01-31 HISTORY — DX: Cognitive communication deficit: R41.841

## 2017-01-31 HISTORY — DX: Periprosthetic fracture around other internal prosthetic joint, initial encounter: M97.8XXA

## 2017-01-31 HISTORY — DX: Presence of unspecified artificial hip joint: Z96.649

## 2017-01-31 HISTORY — DX: Unspecified fall, initial encounter: W19.XXXA

## 2017-01-31 HISTORY — DX: Pressure ulcer of unspecified site, unspecified stage: L89.90

## 2017-01-31 HISTORY — DX: Repeated falls: R29.6

## 2017-01-31 HISTORY — DX: Difficulty in walking, not elsewhere classified: R26.2

## 2017-01-31 LAB — URINALYSIS, ROUTINE W REFLEX MICROSCOPIC
Bilirubin Urine: NEGATIVE
GLUCOSE, UA: NEGATIVE mg/dL
KETONES UR: 5 mg/dL — AB
NITRITE: NEGATIVE
PH: 5 (ref 5.0–8.0)
Protein, ur: 30 mg/dL — AB
SPECIFIC GRAVITY, URINE: 1.023 (ref 1.005–1.030)

## 2017-01-31 LAB — COMPREHENSIVE METABOLIC PANEL
ALBUMIN: 2.5 g/dL — AB (ref 3.5–5.0)
ALK PHOS: 557 U/L — AB (ref 38–126)
ALT: 26 U/L (ref 14–54)
AST: 85 U/L — AB (ref 15–41)
Anion gap: 9 (ref 5–15)
BILIRUBIN TOTAL: 0.6 mg/dL (ref 0.3–1.2)
BUN: 30 mg/dL — AB (ref 6–20)
CO2: 25 mmol/L (ref 22–32)
Calcium: 8.6 mg/dL — ABNORMAL LOW (ref 8.9–10.3)
Chloride: 107 mmol/L (ref 101–111)
Creatinine, Ser: 0.82 mg/dL (ref 0.44–1.00)
GFR calc Af Amer: >60 mL/min (ref >60)
GFR calc non Af Amer: >60 mL/min (ref >60)
GLUCOSE: 215 mg/dL — AB (ref 65–99)
POTASSIUM: 3 mmol/L — AB (ref 3.5–5.1)
SODIUM: 141 mmol/L (ref 135–145)
TOTAL PROTEIN: 5.7 g/dL — AB (ref 6.5–8.1)

## 2017-01-31 LAB — CBC WITH DIFFERENTIAL/PLATELET
BASOS ABS: 0 10*3/uL (ref 0.0–0.1)
Basophils Relative: 0 %
EOS PCT: 0 %
Eosinophils Absolute: 0.1 10*3/uL (ref 0.0–0.7)
HEMATOCRIT: 30.4 % — AB (ref 36.0–46.0)
Hemoglobin: 9.8 g/dL — ABNORMAL LOW (ref 12.0–15.0)
LYMPHS PCT: 10 %
Lymphs Abs: 1.5 10*3/uL (ref 0.7–4.0)
MCH: 31.6 pg (ref 26.0–34.0)
MCHC: 32.2 g/dL (ref 30.0–36.0)
MCV: 98.1 fL (ref 78.0–100.0)
MONO ABS: 1.1 10*3/uL — AB (ref 0.1–1.0)
MONOS PCT: 7 %
NEUTROS ABS: 12.1 10*3/uL — AB (ref 1.7–7.7)
Neutrophils Relative %: 83 %
PLATELETS: 384 10*3/uL (ref 150–400)
RBC: 3.1 MIL/uL — ABNORMAL LOW (ref 3.87–5.11)
RDW: 14.8 % (ref 11.5–15.5)
WBC: 14.7 10*3/uL — ABNORMAL HIGH (ref 4.0–10.5)

## 2017-01-31 LAB — I-STAT CG4 LACTIC ACID, ED: Lactic Acid, Venous: 1.9 mmol/L (ref 0.5–1.9)

## 2017-01-31 MED ORDER — KETOROLAC TROMETHAMINE 30 MG/ML IJ SOLN
30.0000 mg | Freq: Once | INTRAMUSCULAR | Status: AC
  Administered 2017-01-31: 30 mg via INTRAVENOUS
  Filled 2017-01-31 (×2): qty 1

## 2017-01-31 MED ORDER — MAGNESIUM SULFATE 2 GM/50ML IV SOLN
2.0000 g | INTRAVENOUS | Status: AC
  Administered 2017-01-31: 2 g via INTRAVENOUS
  Filled 2017-01-31: qty 50

## 2017-01-31 MED ORDER — SODIUM CHLORIDE 0.9 % IV BOLUS (SEPSIS): 500.0000 mL | Freq: Once | INTRAVENOUS | Status: DC

## 2017-01-31 MED ORDER — SODIUM CHLORIDE 0.9 % IV SOLN
1000.0000 mL | INTRAVENOUS | Status: DC
  Administered 2017-01-31: 1000 mL via INTRAVENOUS

## 2017-01-31 MED ORDER — POTASSIUM CHLORIDE 10 MEQ/100ML IV SOLN
10.0000 meq | Freq: Once | INTRAVENOUS | Status: AC
  Administered 2017-01-31: 10 meq via INTRAVENOUS
  Filled 2017-01-31: qty 100

## 2017-01-31 NOTE — ED Notes (Signed)
Pt taken to [place] by [facility].

## 2017-01-31 NOTE — ED Triage Notes (Signed)
Per pt's poa, pt is on rocephin and zpack for pneumonia.  Reports was supposed to have a repeat cxr today for follow up and an abd [place] because her liver enzymes were elevated and having some gall bladder issues.

## 2017-01-31 NOTE — ED Notes (Addendum)
No pain noted when Dr. Hyacinth Meeker flexed and rotated pts left hip.

## 2017-01-31 NOTE — ED Notes (Signed)
In and out cath performed by this nurse and tori, nt.

## 2017-01-31 NOTE — ED Notes (Signed)
Per pt's daugher. Pt no longer has c diff.

## 2017-01-31 NOTE — ED Notes (Signed)
Pt wheeled out on stretcher by EMS. Pt POA verbalized understanding of discharge instructions.

## 2017-01-31 NOTE — ED Provider Notes (Signed)
[name] DEPT Provider Note   CSN: [id number] Arrival date & time: 01/31/17  1709     History   Chief Complaint Chief Complaint  Patient presents with  . Fever    HPI [name] Oliver is a 83 y.o. female.  HPI 83 y/o female - hx of Dementia - currently in a NH ([facility]) after she had a fracture and repair of her prior hip replacement a month ago - she has in the last week had some problems with delirium (according to the daughter), she has had poor PO intake and has had  ? UTI based on urinalysis and culture from 2 days ago (no culture available).  She has had a ? PNA on CXR from 2 days ago when she had a WBC of 24,500 - was started on Rocephin and Zithromax - nothing for urine given on C&S at that time.  She has had ongoing fever found today when family came and saw her and they insisted on her evaluation in the ED due to not having answers and no overall improvement in her clinical status at the [facility].   Of note the prior LFT's were minimal elevated and Alk Phos was 260, prompting a possible w/u for cholecystitis - this has not yet occurred at the [facility].  There is no vomiting, no diarrhea (has had C dif X 2 in last couple of months) and she has had no cough, sob, abd pain, swelling or rashes other than an open decub on the sacral area.  (was seen this morning by wound care and wound was debrided).  The pt has no information on her condition though she is awake and alert and calm.  Level 5 caveat applies due to dementia  Past Medical History:  Diagnosis Date  . C. difficile colitis   . Chronic cough   . Cognitive communication deficit   . Dementia   . Difficulty in walking   . Dysphagia   . Falls   . Hip fracture ([place])   . Memory difficulty 01/08/2014  . Other specified disorders of bone density and structure, unspecified shoulder   . Pelvis fracture ([place])   . Periprosthetic fracture around internal prosthetic hip joint   . Pressure ulcer   . Primary Parkinsonism [facility])     Patient  Active Problem List   Diagnosis Date Noted  . Left leg DVT ([place]) 08/12/2016  . Periprosthetic fracture around internal prosthetic hip joint 07/26/2016  . Anemia 07/16/2016  . Femoral neck fracture ([place]) 07/15/2016  . Closed fracture of neck of left femur ([place])   . Chronic fatigue 06/14/2016  . Memory difficulty 01/08/2014  . Parkinson's disease dementia ([place]) 10/28/2013  . Abnormality of gait 09/26/2013  . Parkinsonism ([place]) 09/26/2013  . Urinary tract infection without hematuria 09/24/2013  . Urinary retention 09/23/2013  . Unspecified constipation 09/23/2013  . Accelerated hypertension 09/20/2013  . Fall 09/19/2013  . Pelvic fracture ([place]) 09/19/2013  . Leukocytosis 09/19/2013    Past Surgical History:  Procedure Laterality Date  . ABDOMINAL HYSTERECTOMY    . ABDOMINAL HYSTERECTOMY    . BREAST LUMPECTOMY Right   . CATARACT EXTRACTION Bilateral    03/14/13  . FRACTURE SURGERY    . HIP ARTHROPLASTY Left 07/15/2016   Procedure: BIPOLAR REPLACEMENT LEFT HIP;  Surgeon: Carole Civil, MD;  Location: [facility] ORS;  Service: Orthopedics;  Laterality: Left;  . JOINT REPLACEMENT    . ORIF HIP FRACTURE Left 07/28/2016   Procedure: Revision of bipolar hip replacement into  unipolar hip replacement;  Surgeon: Carole Civil, MD;  Location: [facility] ORS;  Service: Orthopedics;  Laterality: Left;  . TOTAL HIP ARTHROPLASTY Right     OB History    Gravida Para Term Preterm AB Living   _0 SAB TAB Ectopic Multiple Live Births   1               Home Medications    Prior to Admission medications   Medication Sig Start Date End Date Taking? Authorizing Provider  acetaminophen (TYLENOL) 500 MG tablet Take 1 tablet (500 mg total) by mouth every 6 (six) hours. 07/18/16   Kathie Dike, MD  aspirin EC 325 MG EC tablet Take 1 tablet (325 mg total) by mouth daily with breakfast. 07/19/16   Kathie Dike, MD  bisacodyl (DULCOLAX) 5 MG EC tablet Take 1 tablet (5 mg total) by mouth daily  as needed for moderate constipation. 08/02/16   Carole Civil, MD  busPIRone (BUSPAR) 5 MG tablet Take 5 mg by mouth 3 (three) times daily.    [provider]  carbidopa-levodopa (SINEMET IR) 25-100 MG tablet Take 0.5 tablets by mouth 3 (three) times daily.    [provider]  Cranberry 450 MG CAPS Take 1 capsule by mouth 2 (two) times daily.    [provider]  memantine (NAMENDA) 5 MG tablet Take 1 tablet (5 mg total) by mouth 2 (two) times daily. 07/14/16   Kathrynn Ducking, MD  mirtazapine (REMERON) 30 MG tablet Take 1 tablet (30 mg total) by mouth at bedtime. 11/10/15   Kathrynn Ducking, MD  saccharomyces boulardii (FLORASTOR) 250 MG capsule Take 250 mg by mouth 3 (three) times daily.     [provider]  Skin Protectants, Misc. (DIMETHICONE-ZINC OXIDE) cream Apply 1 application topically 2 (two) times daily as needed for dry skin.    [provider]  vitamin B-12 (CYANOCOBALAMIN) 1000 MCG tablet Take 1,000 mcg by mouth daily.    [provider]  vitamin C (ASCORBIC ACID) 500 MG tablet Take 500 mg by mouth 2 (two) times daily.    [provider]    Family History Family History  Problem Relation Age of Onset  . Dementia Mother   . Bipolar disorder Sister   . Parkinsonism Sister   . Diabetes Other     Social History Social History  Substance Use Topics  . Smoking status: Never Smoker  . Smokeless tobacco: Never Used  . Alcohol use No     Allergies   Morphine and related; Prednisone; Percocet [oxycodone-acetaminophen]; and Namzaric [memantine hcl-donepezil hcl]   Review of Systems Review of Systems  Unable to perform ROS: Dementia     Physical Exam Updated Vital Signs BP 123/76 (BP Location: Left Arm)   Pulse (!) 103   Temp 100.1 F (37.8 C) (Rectal)   Resp 20   SpO2 96%   Physical Exam  Constitutional: She appears well-developed and well-nourished. No distress.  HENT:  Head: Normocephalic and  atraumatic.  Mouth/Throat: Oropharynx is clear and moist. No oropharyngeal exudate.  Eyes: Pupils are equal, round, and reactive to light. Conjunctivae and EOM are normal. Right eye exhibits no discharge. Left eye exhibits no discharge. No scleral icterus.  Neck: Normal range of motion. Neck supple. No JVD present. No thyromegaly present.  Cardiovascular: Normal rate, regular rhythm, normal heart sounds and intact distal pulses.  Exam reveals no gallop and no friction rub.  No murmur heard. Good pulses, no murmurs  Pulmonary/Chest: Effort normal and breath sounds normal. No respiratory distress. She has no wheezes. She has no rales.  Sat's 96% on RA - no distress, clear lungs  Abdominal: Soft. Bowel sounds are normal. She exhibits no distension and no mass. There is no tenderness.  Soft and NT abdomen - no RUQ ttp  Musculoskeletal: She exhibits tenderness. She exhibits no edema.  Some pain with manipulation of the LLE - there is some pain with ROM of the L hip - she has the hip in a long leg immobilizer - incision on the lateral hip looks to be in good shape without redness, ddrainage or ttp over the site.  Lymphadenopathy:    She has no cervical adenopathy.  Neurological: She is alert. Coordination normal.  Awake, able to follow basic commands - memory is not good - speech is quiet but not slurred - has no facial droop  Skin: Skin is warm and dry. Rash ( open wound over the sacral decub - no purulent drainage - no foul smell.) noted. No erythema.  Nursing note and vitals reviewed.    ED Treatments / Results  Labs (all labs ordered are listed, but only abnormal results are displayed) Labs Reviewed  CULTURE, BLOOD (ROUTINE X 2)  CULTURE, BLOOD (ROUTINE X 2)  URINE CULTURE  COMPREHENSIVE METABOLIC PANEL  CBC WITH DIFFERENTIAL/PLATELET  URINALYSIS, ROUTINE W REFLEX MICROSCOPIC  I-STAT CG4 LACTIC ACID, ED    EKG  EKG Interpretation None       Radiology No results  found.  Procedures Procedures (including critical care time)  Medications Ordered in ED Medications  0.9 %  sodium chloride infusion (not administered)     Initial Impression / Assessment and Plan / ED Course  I have reviewed the triage vital signs and the nursing notes.  Pertinent labs & imaging results that were available during my care of the patient were reviewed by me and considered in my medical decision making (see chart for details).   Has temp of 100.1 here - no acute findings other than the sacral decub Needs check of UA by cath Repeat labs and UA, CXR, lactic acid. CBC showed improved from 24,000 to 14,700 since abx started 2 days ago. Fluids  CBC improved CXR without PNA UA without obvious infection - culture and sensitivity should be available from facility but given results here I don't want to add antibiotics She has no coughign and no hypoxia infact her VS reveal no tachycardia, hypoxia, hypotension and no fever. I have examined her hip on the L and she has no obvious pain with rotation or flexion of the hip.  I doubt a septic joint. US shows no signs of cholecystitis though she does have stones and LFT's improved from prior, alk phos is slightly higher but pt has no abd ttp in the RUQ.  Fluids given Potassium replaced Family member discouraged that there is no indication for admission as she has been trying to get her mother changed to a new NH with little success. Social work consultation requested for the morning to f/u with the daughter about options.  Daughter and patient updated Tolerated PO  Stable for d/c back to NH.   Vitals:   01/31/17 1815 01/31/17 1830 01/31/17 1845 01/31/17 1900  BP:  126/82  140/88  Pulse: 100 90 90 93  Resp: 15     Temp:      TempSrc:      SpO2: 97%  97% 96% 94%  Weight:      Height:         Final Clinical Impressions(s) / ED Diagnoses   Final diagnoses:  Hypokalemia  Dehydration  Leukocytosis, unspecified type   Gall stones    New Prescriptions New Prescriptions   No medications on file     Noemi Chapel, MD 01/31/17 2202

## 2017-01-31 NOTE — Discharge Instructions (Signed)
Your testing this evening does not show any signs of active infection either in your lungs or in your urine. Please continue to finish the course of antibiotics that was initially prescribed, he will need to have your potassium rechecked within 1 week as well. Please return to the emergency department immediately for severe or worsening pain, fever, vomiting or diarrhea.

## 2017-01-31 NOTE — ED Triage Notes (Signed)
Per ems, pt was diagnosed with pneumonia a few days ago and reports fever today.  Per ems temp was 100 orally.

## 2017-02-02 LAB — URINE CULTURE: Culture: <10000 — AB

## 2017-02-05 LAB — CULTURE, BLOOD (ROUTINE X 2)
CULTURE: NO GROWTH
Culture: NO GROWTH
SPECIAL REQUESTS: ADEQUATE
Special Requests: ADEQUATE

## 2017-02-12 DIAGNOSIS — L98419 Non-pressure chronic ulcer of buttock with unspecified severity: Secondary | ICD-10-CM | POA: Diagnosis not present

## 2017-02-12 DIAGNOSIS — D72829 Elevated white blood cell count, unspecified: Secondary | ICD-10-CM | POA: Diagnosis not present

## 2017-02-12 DIAGNOSIS — L89313 Pressure ulcer of right buttock, stage 3: Secondary | ICD-10-CM | POA: Diagnosis not present

## 2017-02-12 DIAGNOSIS — R918 Other nonspecific abnormal finding of lung field: Secondary | ICD-10-CM | POA: Diagnosis not present

## 2017-02-14 ENCOUNTER — Other Ambulatory Visit: Payer: Self-pay | Admitting: *Deleted

## 2017-02-14 ENCOUNTER — Telehealth: Payer: Self-pay | Admitting: Orthopedic Surgery

## 2017-02-14 ENCOUNTER — Ambulatory Visit (HOSPITAL_COMMUNITY)
Admission: RE | Admit: 2017-02-14 | Discharge: 2017-02-14 | Disposition: A | Discharge status: Home / Self Care | Payer: Medicare Other | Source: Ambulatory Visit | Attending: Orthopedic Surgery | Admitting: Orthopedic Surgery

## 2017-02-14 DIAGNOSIS — S7292XA Unspecified fracture of left femur, initial encounter for closed fracture: Secondary | ICD-10-CM | POA: Diagnosis not present

## 2017-02-14 DIAGNOSIS — X58XXXA Exposure to other specified factors, initial encounter: Secondary | ICD-10-CM | POA: Diagnosis not present

## 2017-02-14 DIAGNOSIS — S72302A Unspecified fracture of shaft of left femur, initial encounter for closed fracture: Secondary | ICD-10-CM | POA: Diagnosis not present

## 2017-02-14 NOTE — Telephone Encounter (Signed)
done

## 2017-02-14 NOTE — Telephone Encounter (Signed)
RE: APPOINTMENT 02/15/17 w/Dr Harrison(approved) - Patient's daughter relays that patient (resident at [facility]) is unable to transfer from wheelchair to Xray table; therefore, Per Dr Romeo AppleHarrison, order X[facility] to be [facility].  Please enter orders as they may be able to transport her there today.

## 2017-02-15 ENCOUNTER — Encounter: Payer: Self-pay | Admitting: Orthopedic Surgery

## 2017-02-15 ENCOUNTER — Ambulatory Visit (INDEPENDENT_AMBULATORY_CARE_PROVIDER_SITE_OTHER): Payer: Medicare Other | Admitting: Orthopedic Surgery

## 2017-02-15 VITALS — BP 156/94 | HR 80

## 2017-02-15 DIAGNOSIS — M9702XD Periprosthetic fracture around internal prosthetic left hip joint, subsequent encounter: Secondary | ICD-10-CM

## 2017-02-15 NOTE — Telephone Encounter (Signed)
On 02/14/17, notified Ms. Reuel Boomaniel, patient's daughter and contact.

## 2017-02-15 NOTE — Progress Notes (Signed)
Chief Complaint  Patient presents with  . Leg Injury    POST OP LEFT FEMUR FX REPAIR AT [facility] 12/31/16    Periprosthetic fracture left femur [facility] with cable plate and screws. X-rays from August 10 and September 10 show no change in do not have postop film  Patient has deteriorated mentally and is nonweightbearing as prescribed she is in a locked brace at 30  Will need x-ray in 6 weeks  [place] per her daughter

## 2017-02-16 DIAGNOSIS — K5900 Constipation, unspecified: Secondary | ICD-10-CM | POA: Diagnosis not present

## 2017-02-16 DIAGNOSIS — F802 Mixed receptive-expressive language disorder: Secondary | ICD-10-CM | POA: Diagnosis not present

## 2017-02-16 DIAGNOSIS — R1312 Dysphagia, oropharyngeal phase: Secondary | ICD-10-CM | POA: Diagnosis not present

## 2017-02-16 DIAGNOSIS — R52 Pain, unspecified: Secondary | ICD-10-CM | POA: Diagnosis not present

## 2017-02-16 DIAGNOSIS — F0390 Unspecified dementia without behavioral disturbance: Secondary | ICD-10-CM | POA: Diagnosis not present

## 2017-02-16 DIAGNOSIS — Z23 Encounter for immunization: Secondary | ICD-10-CM | POA: Diagnosis not present

## 2017-02-16 DIAGNOSIS — Z452 Encounter for adjustment and management of vascular access device: Secondary | ICD-10-CM | POA: Diagnosis not present

## 2017-02-16 DIAGNOSIS — I639 Cerebral infarction, unspecified: Secondary | ICD-10-CM | POA: Diagnosis not present

## 2017-02-16 DIAGNOSIS — F329 Major depressive disorder, single episode, unspecified: Secondary | ICD-10-CM | POA: Diagnosis not present

## 2017-02-16 DIAGNOSIS — R296 Repeated falls: Secondary | ICD-10-CM | POA: Diagnosis not present

## 2017-02-16 DIAGNOSIS — F419 Anxiety disorder, unspecified: Secondary | ICD-10-CM | POA: Diagnosis not present

## 2017-02-16 DIAGNOSIS — G20 Parkinson's disease: Secondary | ICD-10-CM | POA: Diagnosis not present

## 2017-02-16 DIAGNOSIS — S72142D Displaced intertrochanteric fracture of left femur, subsequent encounter for closed fracture with routine healing: Secondary | ICD-10-CM | POA: Diagnosis not present

## 2017-02-16 DIAGNOSIS — R109 Unspecified abdominal pain: Secondary | ICD-10-CM | POA: Diagnosis not present

## 2017-02-16 DIAGNOSIS — R740 Nonspecific elevation of levels of transaminase and lactic acid dehydrogenase [LDH]: Secondary | ICD-10-CM | POA: Diagnosis not present

## 2017-02-16 DIAGNOSIS — M6281 Muscle weakness (generalized): Secondary | ICD-10-CM | POA: Diagnosis not present

## 2017-02-16 DIAGNOSIS — A4902 Methicillin resistant Staphylococcus aureus infection, unspecified site: Secondary | ICD-10-CM | POA: Diagnosis not present

## 2017-02-16 DIAGNOSIS — D72829 Elevated white blood cell count, unspecified: Secondary | ICD-10-CM | POA: Diagnosis not present

## 2017-02-28 ENCOUNTER — Encounter: Payer: Self-pay | Admitting: Orthopedic Surgery

## 2017-02-28 NOTE — Pre-Procedure Instructions (Signed)
I placed our records in the patient's chart regarding x-rays received from her operating physician  I spoke to her daughter and told her as much.

## 2017-03-21 ENCOUNTER — Telehealth: Payer: Self-pay | Admitting: Neurology

## 2017-03-21 NOTE — Telephone Encounter (Signed)
Noted. I did not call the family.

## 2017-03-21 NOTE — Telephone Encounter (Signed)
Patient's daughter called to advise patient passed away on 2017-03-22.

## 2017-03-30 ENCOUNTER — Ambulatory Visit: Payer: Medicare Other | Admitting: Orthopedic Surgery

## 2017-04-07 DEATH — deceased

## 2017-04-22 ENCOUNTER — Ambulatory Visit: Payer: Medicare Other | Admitting: Neurology

## 2018-09-30 IMAGING — CR DG PORTABLE PELVIS
1 series · 1 of 1 positions shown · non-contrast
Comparison: 07/14/2016.

CLINICAL DATA: Status post LEFT hip replacement. LEFT femoral neck
fracture.

EXAM:
PORTABLE PELVIS 1-2 VIEWS

[ap]
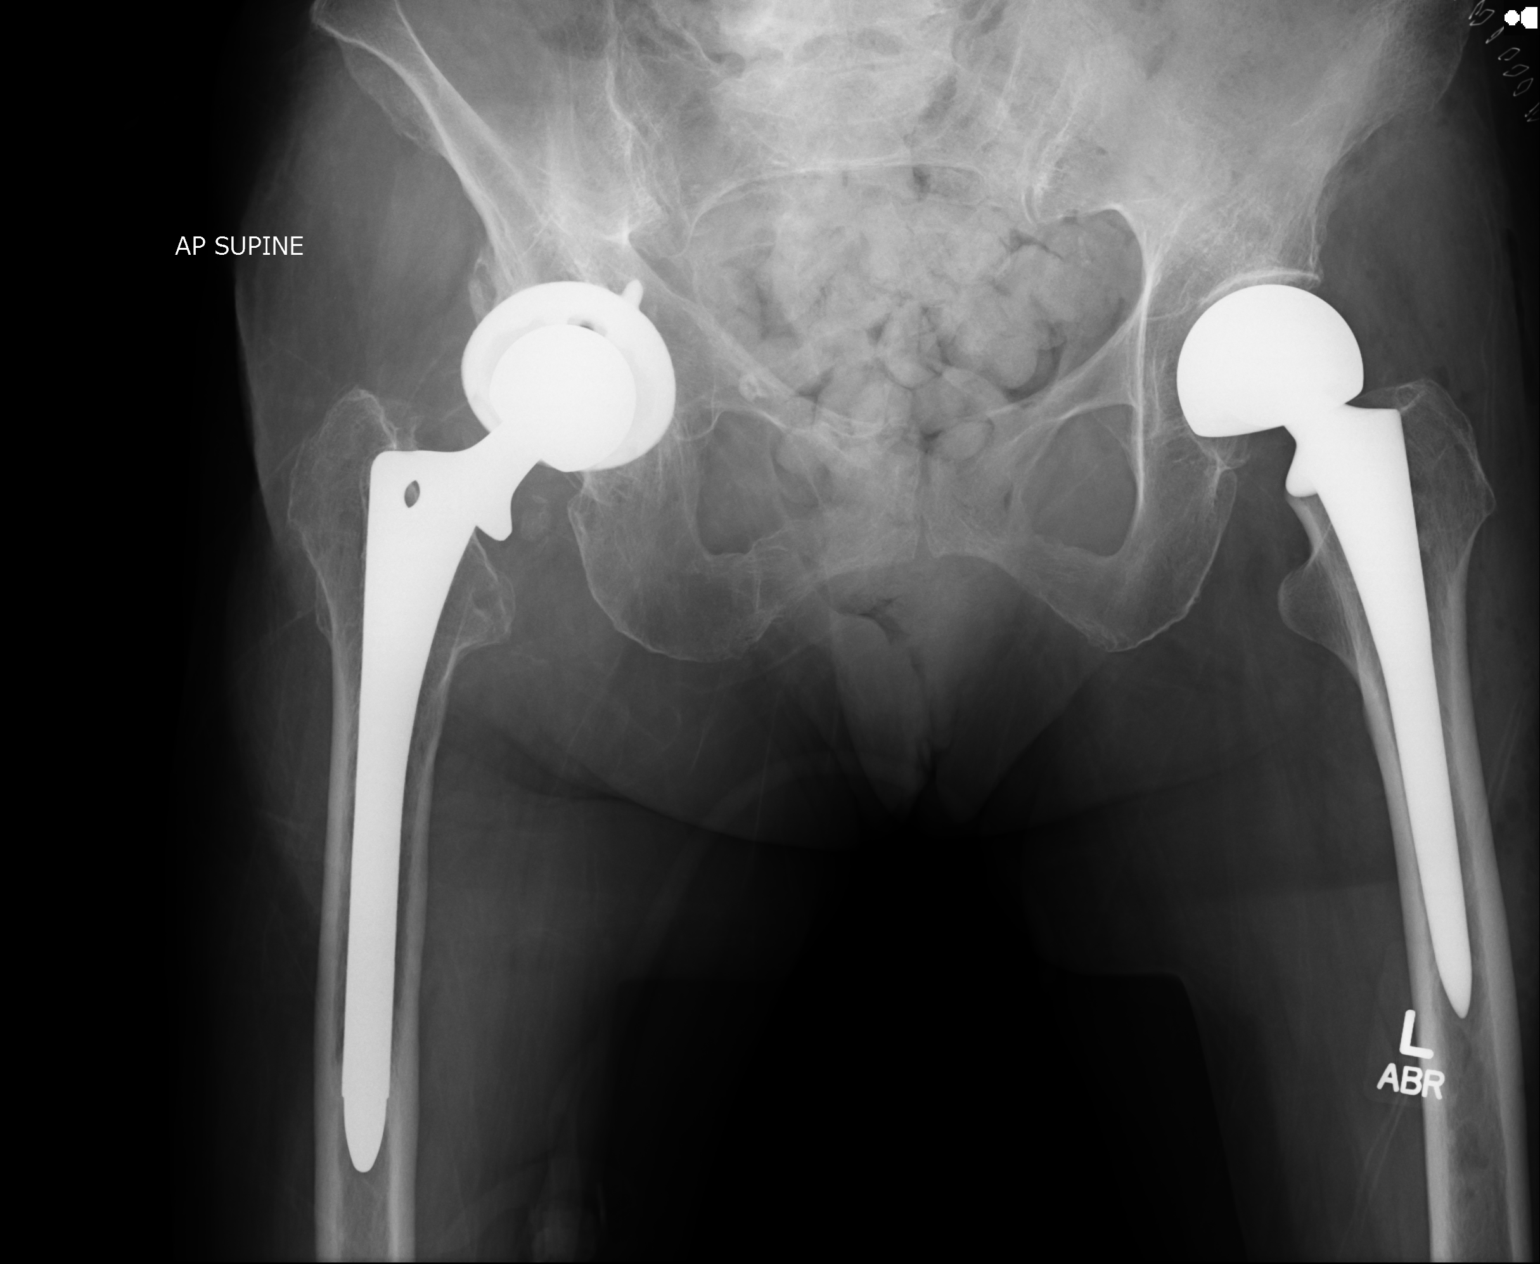

[1 of 1 positions shown; findings below may reference images not displayed]

FINDINGS: Patient is status post open reduction internal fixation of a LEFT
femoral neck fracture with a bipolar prosthesis. Satisfactory
position and alignment. Remote RIGHT THA.
IMPRESSION: No adverse features.

## 2019-01-03 IMAGING — CT CT HEAD W/O CM
4 of 6 series · 16 of 47 positions shown, 18 images · non-contrast
Comparison: MRI of the head March 02, 2013

CLINICAL DATA: Unwitnessed fall, found down by physical therapist.
History of dementia.

EXAM:
CT HEAD WITHOUT CONTRAST
CT CERVICAL SPINE WITHOUT CONTRAST
TECHNIQUE: Multidetector CT imaging of the head and cervical spine was
performed following the standard protocol without intravenous
contrast. Multiplanar CT image reconstructions of the cervical spine
were also generated.

[Series 3: head wo · axial · 0.41mm/px · z∈[+1500,+1615]mm · 6 of 33 slices shown, 8 images]
[im 5/33  brain]
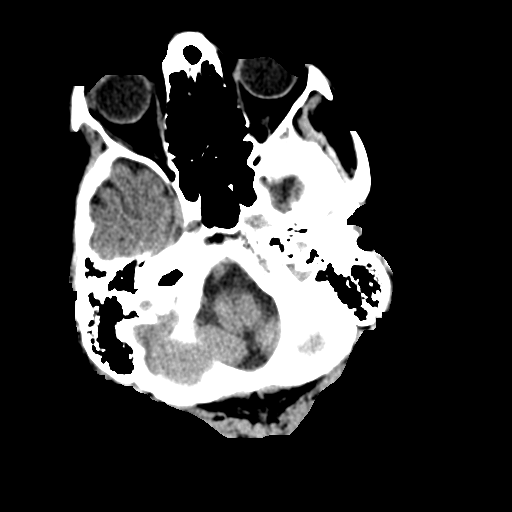
[im 5/33  bone]
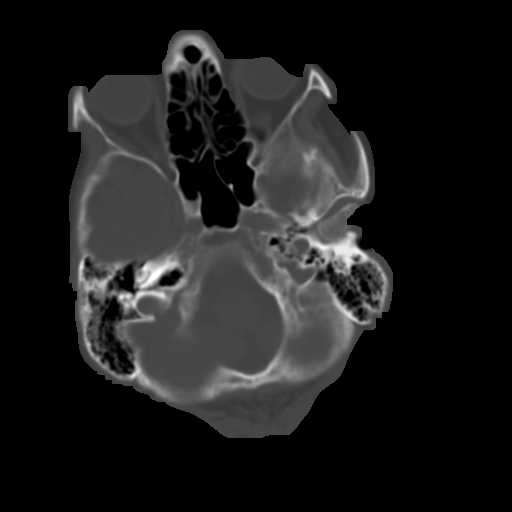
[im 10/33  brain]
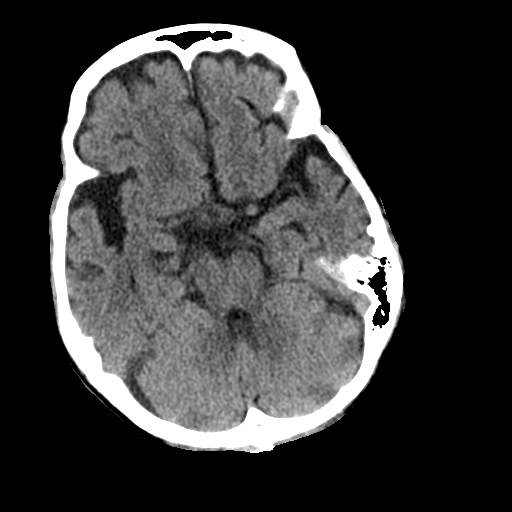
[im 14/33  brain]
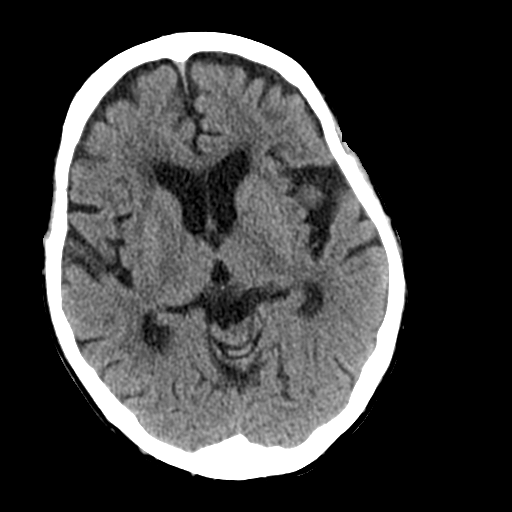
[im 19/33  brain]
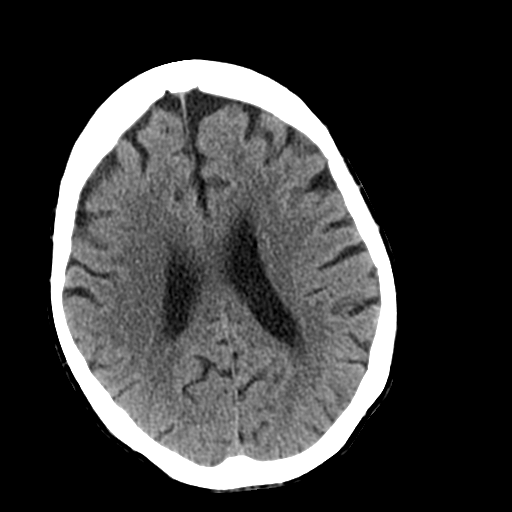
[im 23/33  brain]
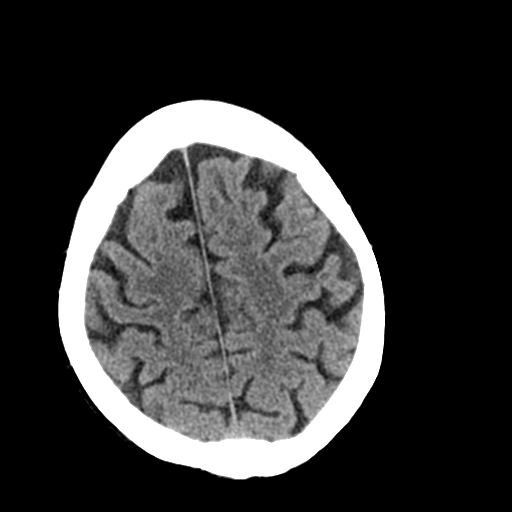
[im 23/33  bone]
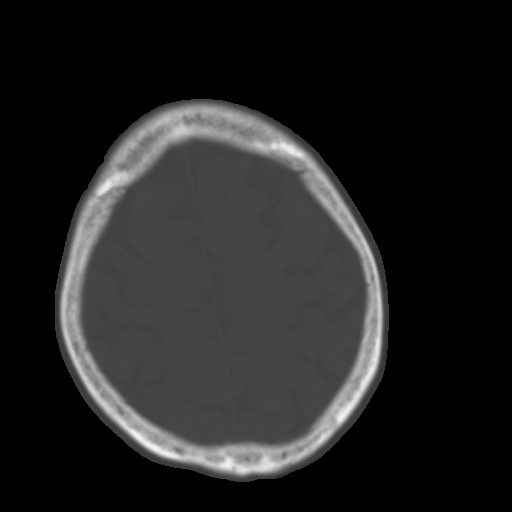
[im 28/33  brain]
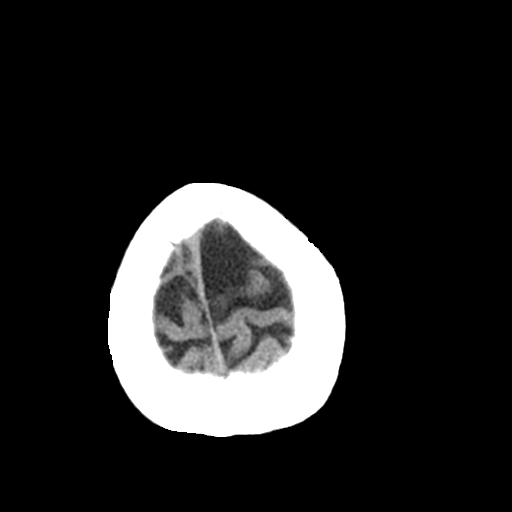

[Series 5: coronal soft tissue · coronal · 0.33mm/px · 3 of 69 slices shown]
[im 20/69  brain]
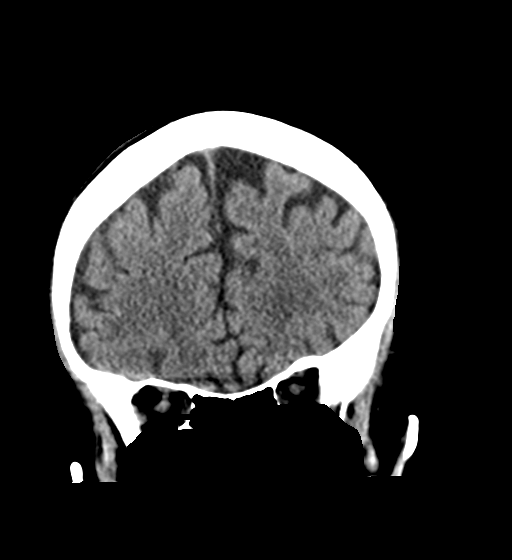
[im 30/69  brain]
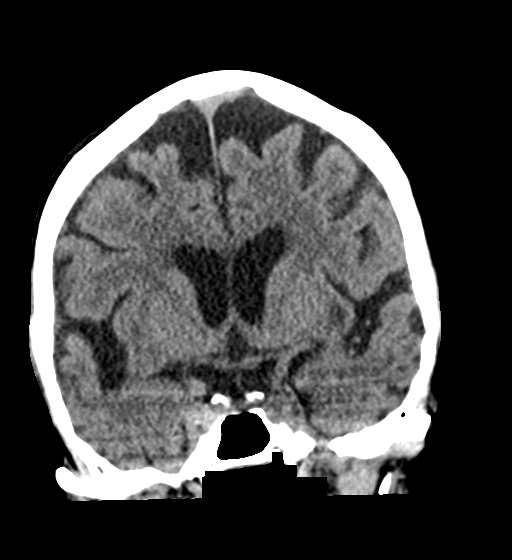
[im 39/69  brain]
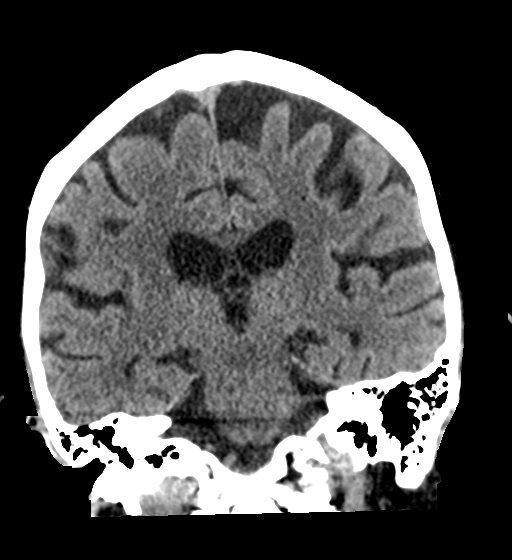

[Series 6: sagittal soft tissue · sagittal · 0.35mm/px · 1 of 54 slices shown]
[im 27/54  brain]
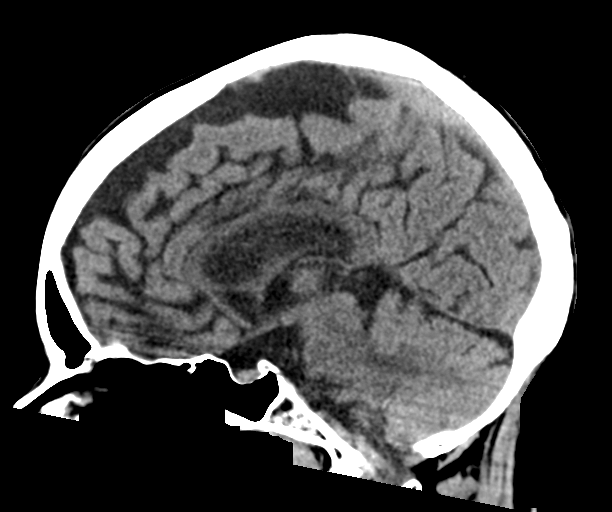

[Series 8: c spine soft · axial · 0.33mm/px · z∈[+1334,+1430]mm · 6 of 84 slices shown]
[im 8/84  brain]
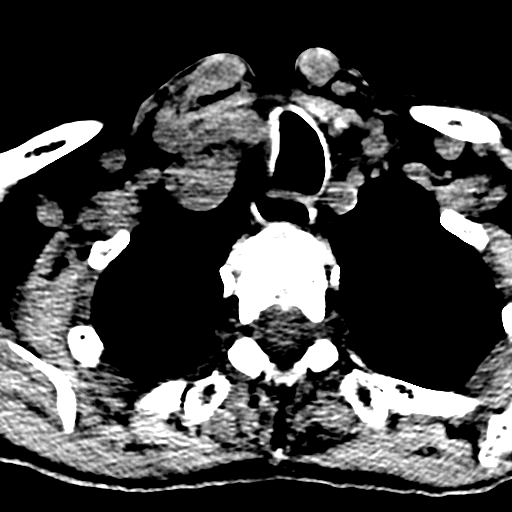
[im 16/84  brain]
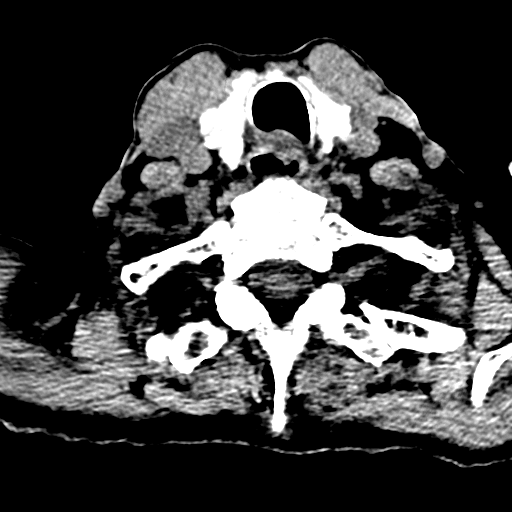
[im 28/84  brain]
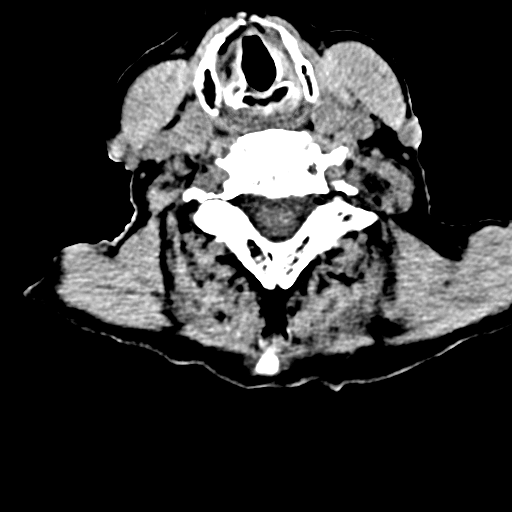
[im 36/84  brain]
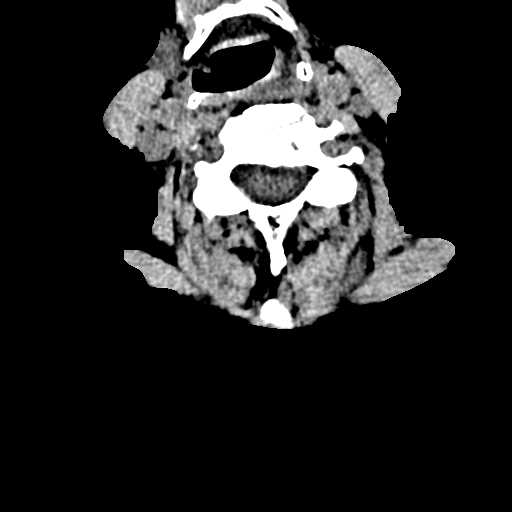
[im 48/84  brain]
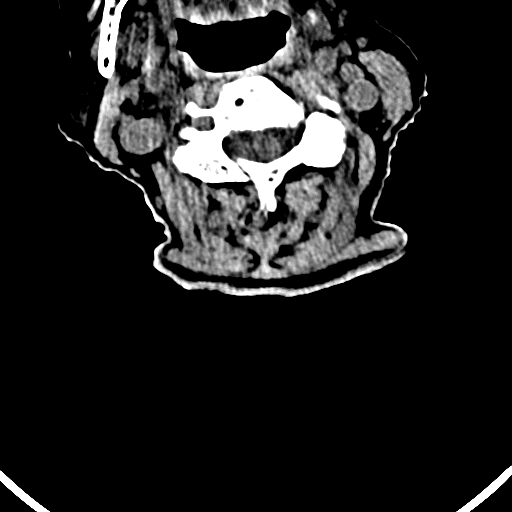
[im 56/84  brain]
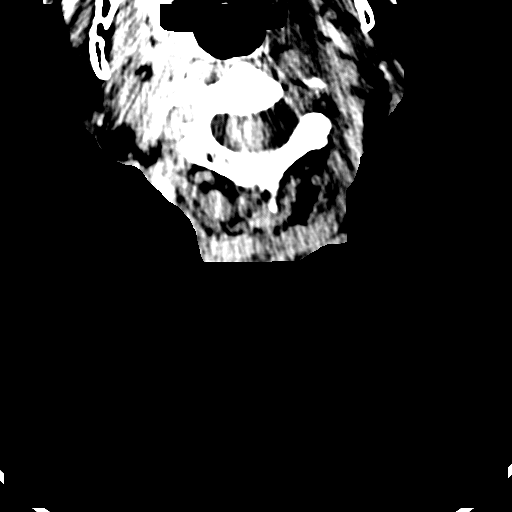

[16 of 47 positions shown; findings below may reference images not displayed]

FINDINGS: CT HEAD FINDINGS

BRAIN: No intraparenchymal hemorrhage, mass effect nor midline
shift. The ventricles and sulci are normal for age. Patchy
supratentorial white matter hypodensities within normal range for
patient's age, though non-specific are most compatible with chronic
small vessel ischemic disease. Mild symmetric basal ganglia atrophy
associated with Parkinson's disease. No acute large vascular
territory infarcts. No abnormal extra-axial fluid collections. Basal
cisterns are patent.

VASCULAR: Mild calcific atherosclerosis of the carotid siphons.

SKULL: No skull fracture. No significant scalp soft tissue swelling.

SINUSES/ORBITS: Small RIGHT sphenoid sinus air-fluid level. Mastoid
air cells are well aerated. The included ocular globes and orbital
contents are non-suspicious. Status post bilateral ocular lens
implants.

OTHER: None.

CT CERVICAL SPINE FINDINGS

ALIGNMENT: Maintained lordosis. Vertebral bodies in alignment.

SKULL BASE AND VERTEBRAE: Cervical vertebral bodies and posterior
elements are intact. Severe C3-4, moderate to severe C5-6 and C6-7,
moderate C4-5 disc height loss with uncovertebral hypertrophy and
endplate spurring compatible with degenerative discs. Osteopenia
without destructive bony lesions. C1-2 articulation maintained with
moderate arthropathy.

SOFT TISSUES AND SPINAL CANAL: Nonacute, mild calcific
atherosclerosis

DISC LEVELS: Mild canal stenosis C3-4. Moderate to severe RIGHT
C3-4, LEFT C4-5 and severe LEFT C5-6 neural foraminal narrowing.

UPPER CHEST: Lung apices are clear.

OTHER: None.
IMPRESSION: CT HEAD: No acute intracranial process.

Stable examination including findings of chronic Parkinson's disease
and mild chronic small vessel ischemic disease.

CT CERVICAL SPINE: No acute fracture or malalignment.

## 2019-03-17 IMAGING — CR DG FEMUR 2+V PORT*L*
1 series · 4 of 4 positions shown · non-contrast
Comparison: Portable exam 8499 hours compared to 07/28/2016 LEFT
hip radiographs and pelvic radiograph 10/18/2016

CLINICAL DATA: Found on the floor, unwitnessed fall, LEFT leg
shorter than RIGHT

EXAM:
LEFT FEMUR PORTABLE 2 VIEWS

[Series 2: ap · 0.17mm/px · 4 of 4 slices shown]
[im 1/4]
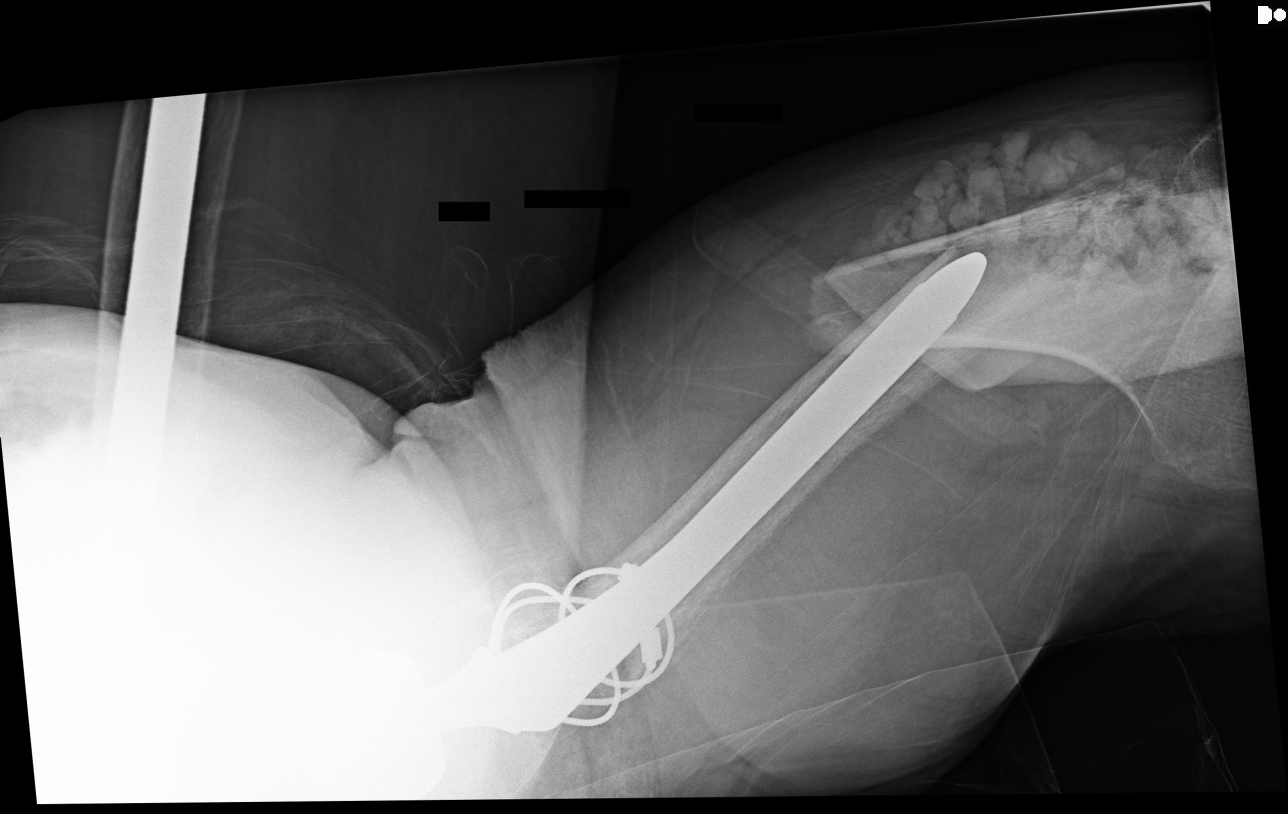
[im 2/4]
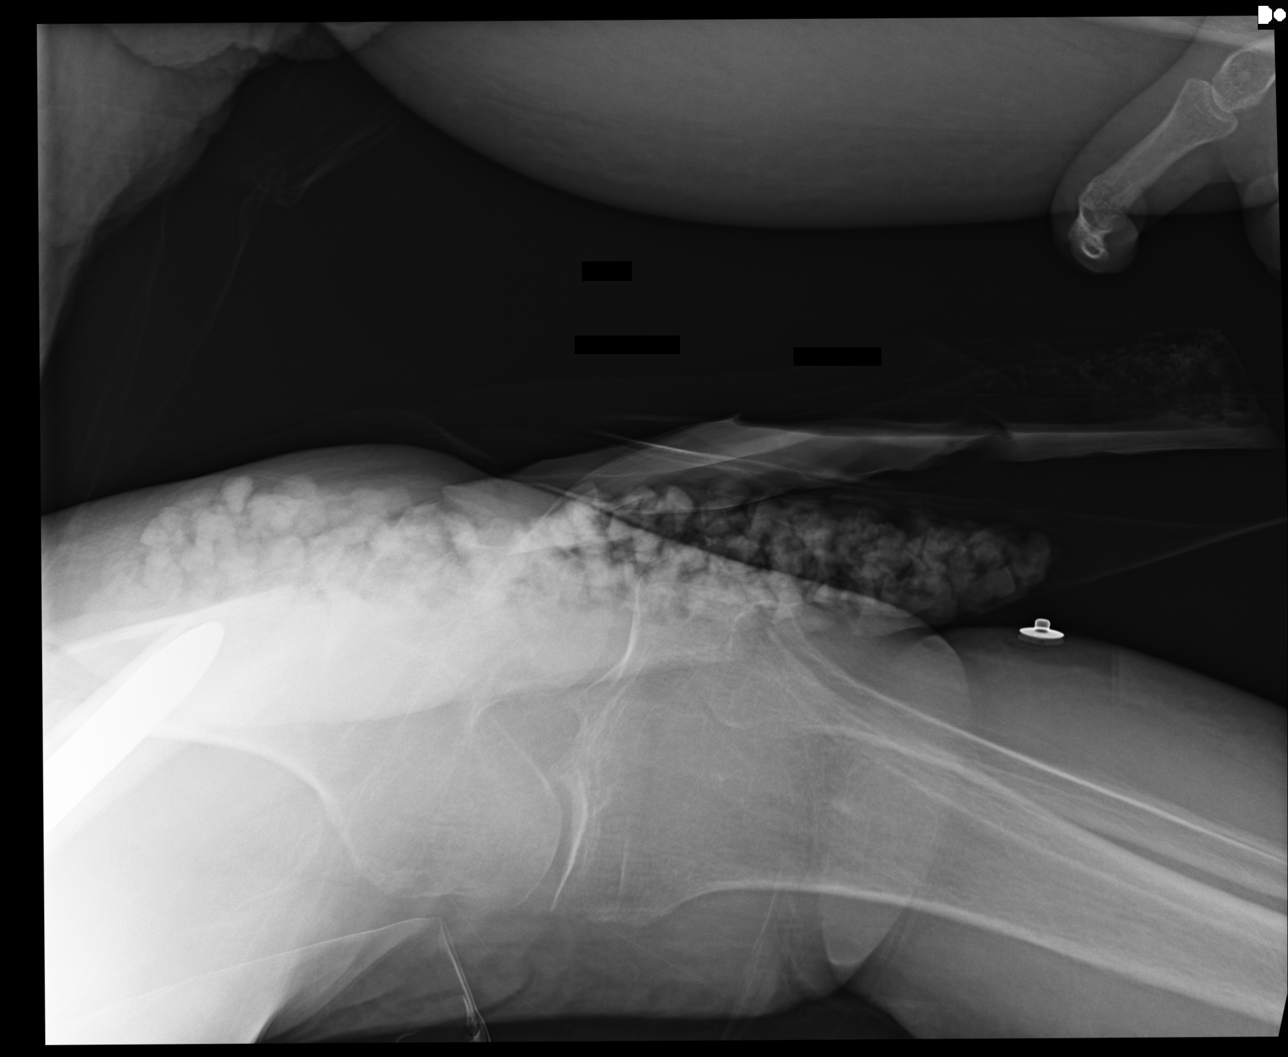
[im 3/4]
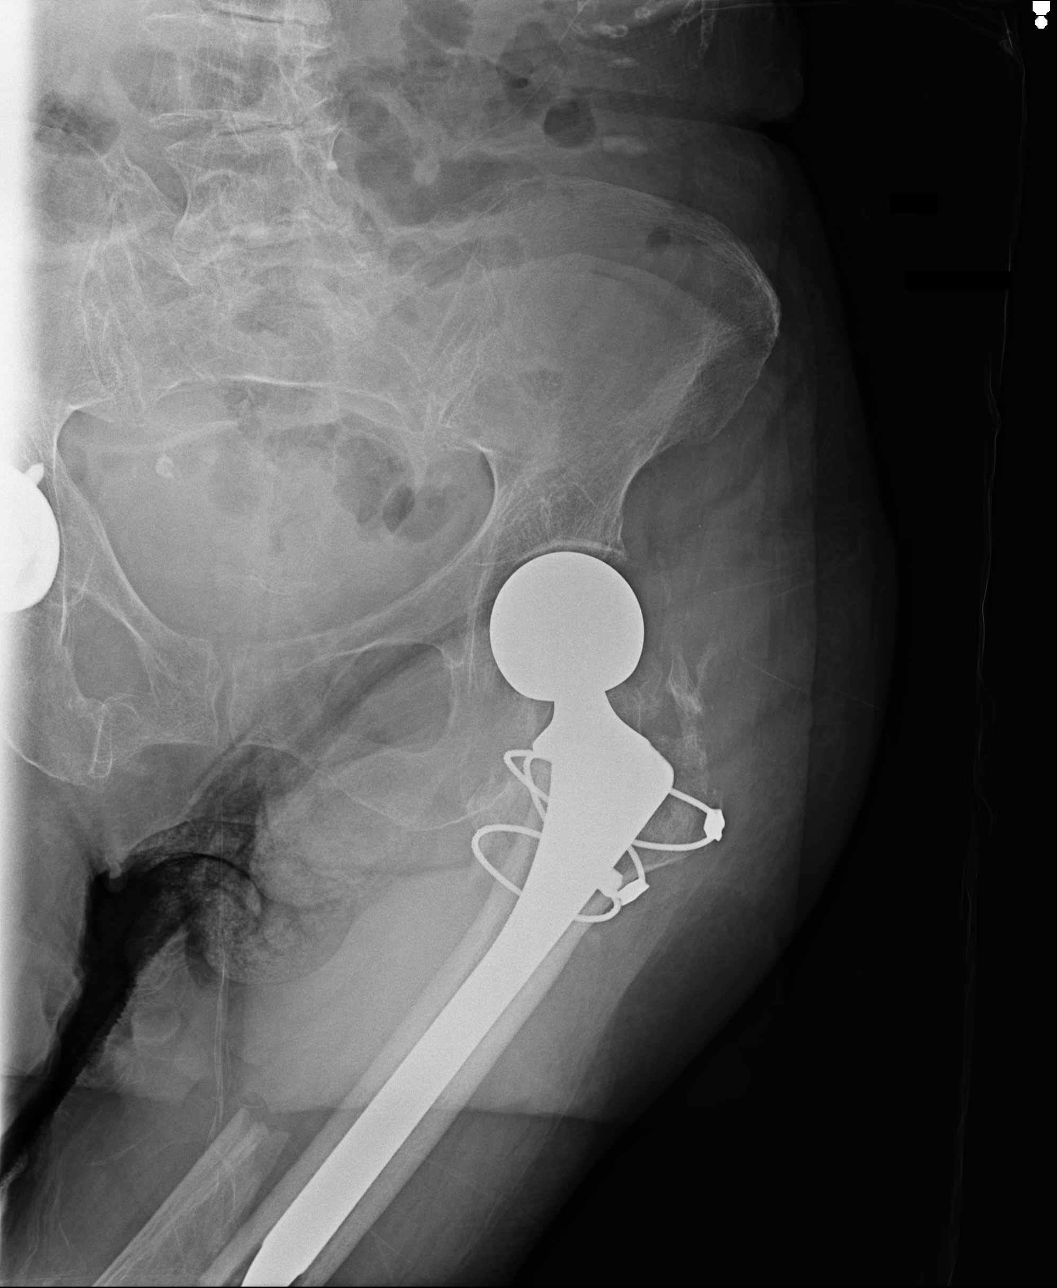
[im 4/4]
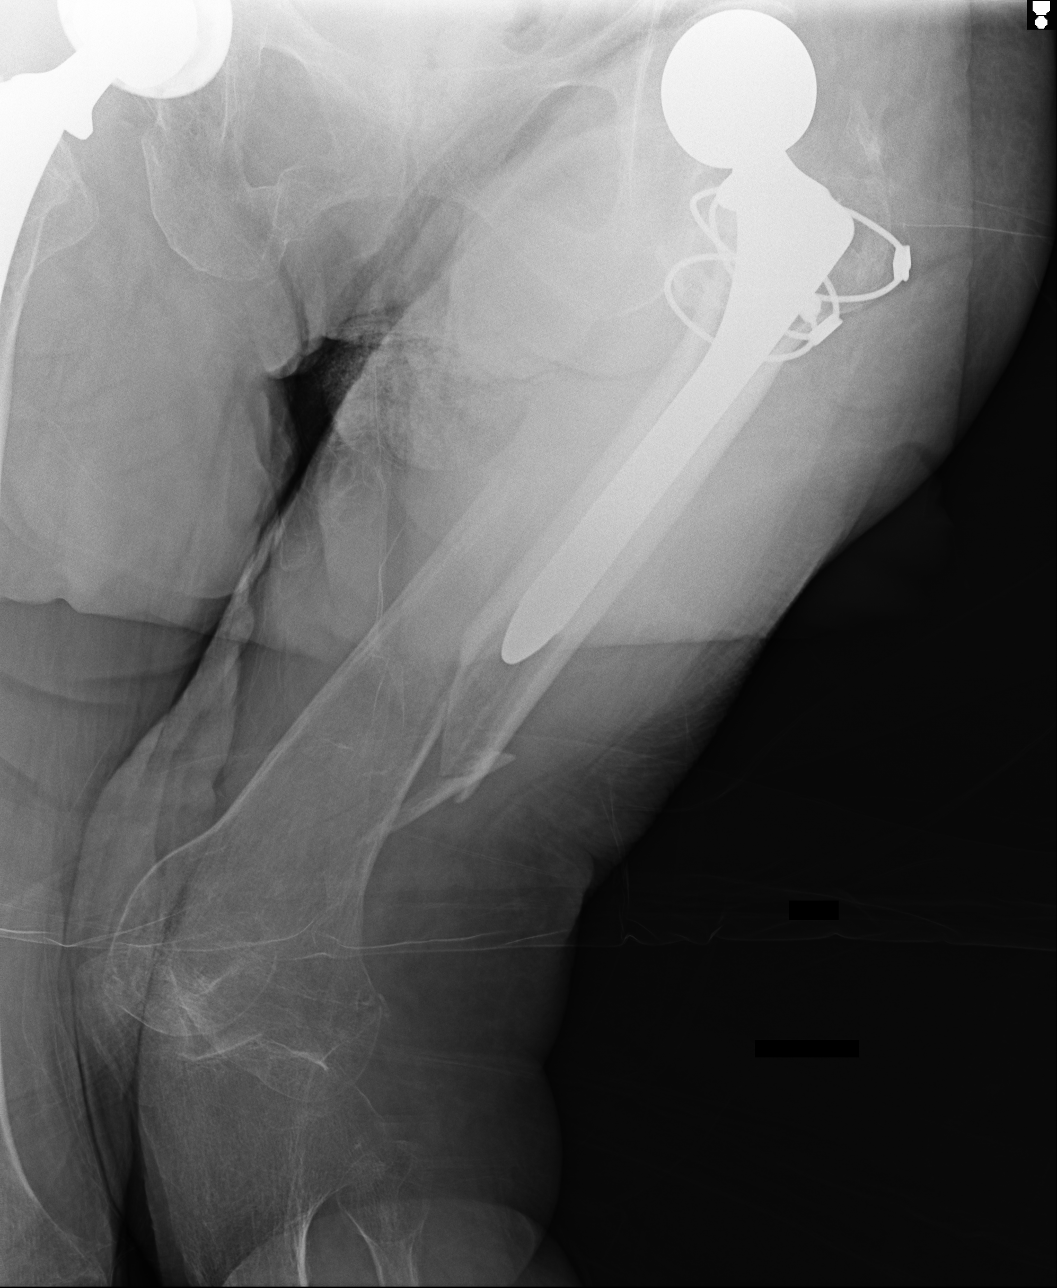

[4 of 4 positions shown; findings below may reference images not displayed]

FINDINGS: Diffuse osseous demineralization.

LEFT hip prosthesis with cerclage wires.

Comminuted displaced and angulated fracture of the femoral diaphysis
distal to the tip of the femoral component of the LEFT hip
prosthesis with nearly 1 shaft width displacement and significant
overriding.

No hip dislocation.

Knee alignment is suboptimally visualized due to positioning and
superimposed artifacts, grossly normally aligned.

Integrity of the lateral femoral condyle cannot be established due
to artifacts.

Curvilinear lucency projects over the distal femoral metaphysis
cannot exclude an additional fracture plane distal extent of which
is not clearly visualized.

Old RIGHT inferior pubic ramus fracture with new visualized pelvis
showing no acute fractures.
IMPRESSION: Displaced angulated and over riding LEFT femoral diaphyseal fracture
distal to the tip of the femoral component of the LEFT hip
prosthesis.

Suboptimal assessment of the distal femur especially the lateral
femoral condyle due to artifact and positioning as discussed above ;
recommend dedicated knee radiographs to assess distal femoral
integrity.

No definite additional focal bony abnormality seen.
